# Patient Record
Sex: Female | Born: 1945 | Race: White | Hispanic: No | Marital: Married | Smoking: Never smoker
Health system: Southern US, Community
[De-identification: ages and names within clinical notes are randomized; demographics above are authoritative.]

## PROBLEM LIST (undated history)

## (undated) DIAGNOSIS — E0590 Thyrotoxicosis, unspecified without thyrotoxic crisis or storm: Secondary | ICD-10-CM

## (undated) DIAGNOSIS — K5731 Diverticulosis of large intestine without perforation or abscess with bleeding: Secondary | ICD-10-CM

## (undated) DIAGNOSIS — L409 Psoriasis, unspecified: Secondary | ICD-10-CM

## (undated) DIAGNOSIS — D0739 Carcinoma in situ of other female genital organs: Secondary | ICD-10-CM

## (undated) DIAGNOSIS — Z9221 Personal history of antineoplastic chemotherapy: Secondary | ICD-10-CM

## (undated) DIAGNOSIS — T8859XA Other complications of anesthesia, initial encounter: Secondary | ICD-10-CM

## (undated) DIAGNOSIS — M1990 Unspecified osteoarthritis, unspecified site: Secondary | ICD-10-CM

## (undated) DIAGNOSIS — R351 Nocturia: Secondary | ICD-10-CM

## (undated) DIAGNOSIS — F419 Anxiety disorder, unspecified: Secondary | ICD-10-CM

## (undated) DIAGNOSIS — D696 Thrombocytopenia, unspecified: Secondary | ICD-10-CM

## (undated) DIAGNOSIS — G709 Myoneural disorder, unspecified: Secondary | ICD-10-CM

## (undated) DIAGNOSIS — Z972 Presence of dental prosthetic device (complete) (partial): Secondary | ICD-10-CM

## (undated) DIAGNOSIS — Z923 Personal history of irradiation: Secondary | ICD-10-CM

## (undated) DIAGNOSIS — C801 Malignant (primary) neoplasm, unspecified: Secondary | ICD-10-CM

## (undated) DIAGNOSIS — E039 Hypothyroidism, unspecified: Secondary | ICD-10-CM

## (undated) DIAGNOSIS — C50919 Malignant neoplasm of unspecified site of unspecified female breast: Secondary | ICD-10-CM

## (undated) DIAGNOSIS — E049 Nontoxic goiter, unspecified: Secondary | ICD-10-CM

## (undated) DIAGNOSIS — I10 Essential (primary) hypertension: Secondary | ICD-10-CM

## (undated) DIAGNOSIS — K648 Other hemorrhoids: Secondary | ICD-10-CM

## (undated) DIAGNOSIS — E119 Type 2 diabetes mellitus without complications: Secondary | ICD-10-CM

## (undated) DIAGNOSIS — D649 Anemia, unspecified: Secondary | ICD-10-CM

## (undated) DIAGNOSIS — R011 Cardiac murmur, unspecified: Secondary | ICD-10-CM

## (undated) DIAGNOSIS — J189 Pneumonia, unspecified organism: Secondary | ICD-10-CM

## (undated) DIAGNOSIS — N189 Chronic kidney disease, unspecified: Secondary | ICD-10-CM

## (undated) DIAGNOSIS — E785 Hyperlipidemia, unspecified: Secondary | ICD-10-CM

## (undated) HISTORY — PX: BREAST LUMPECTOMY: SHX2

## (undated) HISTORY — PX: OOPHORECTOMY: SHX86

## (undated) HISTORY — PX: THYROIDECTOMY: SHX17

## (undated) HISTORY — PX: ABDOMINAL HYSTERECTOMY: SHX81

## (undated) HISTORY — PX: CHOLECYSTECTOMY: SHX55

## (undated) HISTORY — PX: FINE NEEDLE ASPIRATION: SHX406

## (undated) HISTORY — PX: APPENDECTOMY: SHX54

---

## 2004-07-22 ENCOUNTER — Ambulatory Visit: Payer: Self-pay | Admitting: Family Medicine

## 2004-08-15 ENCOUNTER — Ambulatory Visit: Payer: Self-pay | Admitting: Gastroenterology

## 2004-10-30 ENCOUNTER — Ambulatory Visit: Payer: Self-pay | Admitting: Gastroenterology

## 2004-11-19 ENCOUNTER — Other Ambulatory Visit: Payer: Self-pay

## 2004-11-19 ENCOUNTER — Ambulatory Visit: Payer: Self-pay | Admitting: Gastroenterology

## 2004-11-25 ENCOUNTER — Ambulatory Visit: Payer: Self-pay | Admitting: Gastroenterology

## 2004-12-15 ENCOUNTER — Emergency Department: Payer: Self-pay | Admitting: Emergency Medicine

## 2005-01-09 ENCOUNTER — Ambulatory Visit: Payer: Self-pay | Admitting: Internal Medicine

## 2005-01-26 ENCOUNTER — Ambulatory Visit: Payer: Self-pay | Admitting: Internal Medicine

## 2005-01-26 DIAGNOSIS — Z9221 Personal history of antineoplastic chemotherapy: Secondary | ICD-10-CM

## 2005-01-26 DIAGNOSIS — Z923 Personal history of irradiation: Secondary | ICD-10-CM

## 2005-01-26 DIAGNOSIS — C801 Malignant (primary) neoplasm, unspecified: Secondary | ICD-10-CM

## 2005-01-26 DIAGNOSIS — C50919 Malignant neoplasm of unspecified site of unspecified female breast: Secondary | ICD-10-CM

## 2005-01-26 HISTORY — PX: MASTECTOMY PARTIAL / LUMPECTOMY: SUR851

## 2005-01-26 HISTORY — DX: Personal history of irradiation: Z92.3

## 2005-01-26 HISTORY — PX: BREAST BIOPSY: SHX20

## 2005-01-26 HISTORY — DX: Malignant (primary) neoplasm, unspecified: C80.1

## 2005-01-26 HISTORY — DX: Personal history of antineoplastic chemotherapy: Z92.21

## 2005-01-26 HISTORY — DX: Malignant neoplasm of unspecified site of unspecified female breast: C50.919

## 2005-02-23 ENCOUNTER — Ambulatory Visit: Payer: Self-pay | Admitting: Unknown Physician Specialty

## 2005-02-25 ENCOUNTER — Ambulatory Visit: Payer: Self-pay | Admitting: Unknown Physician Specialty

## 2005-02-26 ENCOUNTER — Ambulatory Visit: Payer: Self-pay | Admitting: Internal Medicine

## 2005-03-26 ENCOUNTER — Ambulatory Visit: Payer: Self-pay | Admitting: Internal Medicine

## 2005-04-02 ENCOUNTER — Ambulatory Visit: Payer: Self-pay | Admitting: Unknown Physician Specialty

## 2005-05-13 ENCOUNTER — Ambulatory Visit: Payer: Self-pay | Admitting: Internal Medicine

## 2005-05-26 ENCOUNTER — Ambulatory Visit: Payer: Self-pay | Admitting: Internal Medicine

## 2005-07-08 ENCOUNTER — Ambulatory Visit: Payer: Self-pay | Admitting: Internal Medicine

## 2005-07-15 ENCOUNTER — Ambulatory Visit: Payer: Self-pay

## 2005-07-26 ENCOUNTER — Ambulatory Visit: Payer: Self-pay | Admitting: Internal Medicine

## 2005-07-30 ENCOUNTER — Ambulatory Visit: Payer: Self-pay | Admitting: Family Medicine

## 2005-08-04 ENCOUNTER — Ambulatory Visit: Payer: Self-pay | Admitting: Family Medicine

## 2005-08-27 ENCOUNTER — Ambulatory Visit: Payer: Self-pay | Admitting: Unknown Physician Specialty

## 2005-08-31 ENCOUNTER — Ambulatory Visit: Payer: Self-pay | Admitting: Unknown Physician Specialty

## 2005-09-03 ENCOUNTER — Ambulatory Visit: Payer: Self-pay | Admitting: General Surgery

## 2005-09-03 ENCOUNTER — Ambulatory Visit: Payer: Self-pay | Admitting: Internal Medicine

## 2005-09-14 ENCOUNTER — Ambulatory Visit: Payer: Self-pay | Admitting: General Surgery

## 2005-09-26 ENCOUNTER — Ambulatory Visit: Payer: Self-pay | Admitting: Internal Medicine

## 2005-10-01 ENCOUNTER — Ambulatory Visit: Payer: Self-pay | Admitting: General Surgery

## 2005-10-22 ENCOUNTER — Ambulatory Visit: Payer: Self-pay | Admitting: Specialist

## 2005-10-26 ENCOUNTER — Ambulatory Visit: Payer: Self-pay | Admitting: Internal Medicine

## 2005-11-26 ENCOUNTER — Ambulatory Visit: Payer: Self-pay | Admitting: Internal Medicine

## 2005-12-26 ENCOUNTER — Ambulatory Visit: Payer: Self-pay | Admitting: Internal Medicine

## 2006-01-26 ENCOUNTER — Ambulatory Visit: Payer: Self-pay | Admitting: Internal Medicine

## 2006-02-26 ENCOUNTER — Ambulatory Visit: Payer: Self-pay | Admitting: Internal Medicine

## 2006-03-27 ENCOUNTER — Ambulatory Visit: Payer: Self-pay | Admitting: Internal Medicine

## 2006-04-13 ENCOUNTER — Ambulatory Visit: Payer: Self-pay | Admitting: General Surgery

## 2006-04-27 ENCOUNTER — Ambulatory Visit: Payer: Self-pay | Admitting: Internal Medicine

## 2006-05-27 ENCOUNTER — Ambulatory Visit: Payer: Self-pay | Admitting: Internal Medicine

## 2006-06-27 ENCOUNTER — Ambulatory Visit: Payer: Self-pay | Admitting: Internal Medicine

## 2006-06-28 ENCOUNTER — Ambulatory Visit: Payer: Self-pay | Admitting: Internal Medicine

## 2006-06-30 ENCOUNTER — Ambulatory Visit: Payer: Self-pay | Admitting: Unknown Physician Specialty

## 2006-07-27 ENCOUNTER — Ambulatory Visit: Payer: Self-pay | Admitting: Internal Medicine

## 2006-08-27 ENCOUNTER — Ambulatory Visit: Payer: Self-pay | Admitting: Radiation Oncology

## 2006-08-27 ENCOUNTER — Ambulatory Visit: Payer: Self-pay | Admitting: Internal Medicine

## 2006-09-27 ENCOUNTER — Ambulatory Visit: Payer: Self-pay | Admitting: Internal Medicine

## 2006-09-27 ENCOUNTER — Ambulatory Visit: Payer: Self-pay | Admitting: Radiation Oncology

## 2006-10-04 ENCOUNTER — Ambulatory Visit: Payer: Self-pay | Admitting: General Surgery

## 2006-10-27 ENCOUNTER — Ambulatory Visit: Payer: Self-pay | Admitting: Internal Medicine

## 2007-01-27 ENCOUNTER — Ambulatory Visit: Payer: Self-pay | Admitting: Internal Medicine

## 2007-02-21 ENCOUNTER — Ambulatory Visit: Payer: Self-pay | Admitting: Internal Medicine

## 2007-02-27 ENCOUNTER — Ambulatory Visit: Payer: Self-pay | Admitting: Internal Medicine

## 2007-03-27 ENCOUNTER — Ambulatory Visit: Payer: Self-pay | Admitting: Internal Medicine

## 2007-04-15 ENCOUNTER — Ambulatory Visit: Payer: Self-pay | Admitting: Internal Medicine

## 2007-04-27 ENCOUNTER — Ambulatory Visit: Payer: Self-pay | Admitting: Internal Medicine

## 2007-05-09 ENCOUNTER — Ambulatory Visit: Payer: Self-pay | Admitting: General Surgery

## 2007-07-27 ENCOUNTER — Ambulatory Visit: Payer: Self-pay | Admitting: Internal Medicine

## 2007-08-23 ENCOUNTER — Ambulatory Visit: Payer: Self-pay | Admitting: Internal Medicine

## 2007-08-27 ENCOUNTER — Ambulatory Visit: Payer: Self-pay | Admitting: Internal Medicine

## 2007-11-09 ENCOUNTER — Ambulatory Visit: Payer: Self-pay | Admitting: Internal Medicine

## 2008-01-27 ENCOUNTER — Ambulatory Visit: Payer: Self-pay | Admitting: Internal Medicine

## 2008-02-20 ENCOUNTER — Ambulatory Visit: Payer: Self-pay | Admitting: Internal Medicine

## 2008-02-27 ENCOUNTER — Ambulatory Visit: Payer: Self-pay | Admitting: Internal Medicine

## 2008-05-26 ENCOUNTER — Ambulatory Visit: Payer: Self-pay | Admitting: Internal Medicine

## 2008-06-12 ENCOUNTER — Ambulatory Visit: Payer: Self-pay | Admitting: Internal Medicine

## 2008-06-20 ENCOUNTER — Ambulatory Visit: Payer: Self-pay | Admitting: Unknown Physician Specialty

## 2008-06-26 ENCOUNTER — Ambulatory Visit: Payer: Self-pay | Admitting: Internal Medicine

## 2008-07-26 ENCOUNTER — Ambulatory Visit: Payer: Self-pay | Admitting: Internal Medicine

## 2008-08-13 ENCOUNTER — Ambulatory Visit: Payer: Self-pay | Admitting: Internal Medicine

## 2008-08-26 ENCOUNTER — Ambulatory Visit: Payer: Self-pay | Admitting: Internal Medicine

## 2008-11-12 ENCOUNTER — Ambulatory Visit: Payer: Self-pay | Admitting: Internal Medicine

## 2008-11-26 ENCOUNTER — Ambulatory Visit: Payer: Self-pay | Admitting: Internal Medicine

## 2008-12-11 ENCOUNTER — Ambulatory Visit: Payer: Self-pay | Admitting: Internal Medicine

## 2008-12-26 ENCOUNTER — Ambulatory Visit: Payer: Self-pay | Admitting: Internal Medicine

## 2009-04-01 ENCOUNTER — Ambulatory Visit: Payer: Self-pay | Admitting: Unknown Physician Specialty

## 2009-05-26 ENCOUNTER — Ambulatory Visit: Payer: Self-pay | Admitting: Internal Medicine

## 2009-06-06 ENCOUNTER — Ambulatory Visit: Payer: Self-pay | Admitting: Internal Medicine

## 2009-06-26 ENCOUNTER — Ambulatory Visit: Payer: Self-pay | Admitting: Internal Medicine

## 2009-11-13 ENCOUNTER — Ambulatory Visit: Payer: Self-pay | Admitting: Internal Medicine

## 2009-12-13 ENCOUNTER — Ambulatory Visit: Payer: Self-pay | Admitting: Internal Medicine

## 2009-12-26 ENCOUNTER — Ambulatory Visit: Payer: Self-pay | Admitting: Internal Medicine

## 2009-12-31 ENCOUNTER — Ambulatory Visit: Payer: Self-pay | Admitting: Unknown Physician Specialty

## 2010-11-18 ENCOUNTER — Ambulatory Visit: Payer: Self-pay | Admitting: Internal Medicine

## 2010-12-12 ENCOUNTER — Ambulatory Visit: Payer: Self-pay | Admitting: Internal Medicine

## 2010-12-13 LAB — CANCER ANTIGEN 27.29: CA 27.29: 18.3 U/mL (ref 0.0–38.6)

## 2010-12-27 ENCOUNTER — Ambulatory Visit: Payer: Self-pay | Admitting: Internal Medicine

## 2011-10-28 ENCOUNTER — Ambulatory Visit (INDEPENDENT_AMBULATORY_CARE_PROVIDER_SITE_OTHER): Payer: Self-pay | Admitting: Surgery

## 2011-10-28 ENCOUNTER — Encounter (INDEPENDENT_AMBULATORY_CARE_PROVIDER_SITE_OTHER): Payer: Self-pay | Admitting: Surgery

## 2011-11-02 ENCOUNTER — Ambulatory Visit (INDEPENDENT_AMBULATORY_CARE_PROVIDER_SITE_OTHER): Payer: Medicare Other | Admitting: Surgery

## 2011-11-02 ENCOUNTER — Encounter (INDEPENDENT_AMBULATORY_CARE_PROVIDER_SITE_OTHER): Payer: Self-pay | Admitting: Surgery

## 2011-11-02 VITALS — BP 162/48 | HR 76 | Temp 98.0°F | Resp 16 | Ht 62.0 in | Wt 175.6 lb

## 2011-11-02 DIAGNOSIS — E040 Nontoxic diffuse goiter: Secondary | ICD-10-CM | POA: Insufficient documentation

## 2011-11-02 DIAGNOSIS — E0590 Thyrotoxicosis, unspecified without thyrotoxic crisis or storm: Secondary | ICD-10-CM

## 2011-11-02 DIAGNOSIS — E049 Nontoxic goiter, unspecified: Secondary | ICD-10-CM

## 2011-11-02 NOTE — Patient Instructions (Signed)
[facility] [phone]  THYROID & PARATHYROID SURGERY -- POST OP INSTRUCTIONS  Always review your discharge instruction sheet from the facility where your surgery was performed.  1. A prescription for pain medication may be given to you upon discharge.  Take your pain medication as prescribed.  If narcotic pain medicine is not needed, then you may take acetaminophen (Tylenol) or ibuprofen (Advil) as needed. 2. Take your usually prescribed medications unless otherwise directed. 3. If you need a refill on your pain medication, please contact your pharmacy. They will contact our office to request authorization.  Prescriptions will not be processed after 5 pm or on weekends. 4. Start with a light diet upon arrival home, such as soup and crackers or toast.  Be sure to drink plenty of fluids daily.  Resume your normal diet the day after surgery. 5. Most patients will experience some swelling and bruising on the chest and neck area.  Ice packs will help.  Swelling and bruising can take several days to resolve.  6. It is common to experience some constipation if taking pain medication after surgery.  Increasing fluid intake and taking a stool softener will usually help or prevent this problem.  A mild laxative (Milk of Magnesia or Miralax) should be taken according to package directions if there are no bowel movements after 48 hours. 7. You may remove your bandages 24-48 hours after surgery, and you may shower at that time.  You have steri-strips (small skin tapes) in place directly over the incision.  These strips should be left on the skin for 7-10 days and then removed. 8. You may resume regular (light) daily activities beginning the next day-such as daily self-care, walking, climbing stairs-gradually increasing activities as tolerated.  You may have sexual intercourse when it is comfortable.  Refrain from any heavy lifting or straining until approved by your doctor.  You may drive when  you no longer are taking prescription pain medication, you can comfortably wear a seatbelt, and you can safely maneuver your car and apply brakes. 9. You should see your doctor in the office for a follow-up appointment approximately two to three weeks after your surgery.  Make sure that you call for this appointment within a day or two after you arrive home to insure a convenient appointment time.  WHEN TO CALL YOUR DOCTOR: 1. Fever greater than 101.5 2. Inability to urinate 3. Nausea and/or vomiting - persistent 4. Extreme swelling or bruising 5. Continued bleeding from incision 6. Increased pain, redness, or drainage from the incision 7. Difficulty swallowing or breathing 8. Muscle cramping or spasms 9. Numbness or tingling in hands or around lips  The clinic staff is available to answer your questions during regular business hours.  Please don't hesitate to call and ask to speak to one of the nurses if you have concerns. [facility] [phone]  [email or web address]   

## 2011-11-02 NOTE — Progress Notes (Signed)
General Surgery - [facility]  Chief Complaint  Patient presents with  . New Evaluation    eval goiter - referral from Dr. Aileen Miller, [facility]    HISTORY: Patient is a 66-year-old white female referred by her endocrinologist for evaluation of enlarging thyroid goiter and borderline hyperthyroidism. Patient has had a history of thyroid goiter dating back 35 years. She has had gradual increase in size. She has been treated in the distant past with oral iodine therapy. She underwent radioactive iodine treatment with 142 mCi of I-125 E. In March of 2011. However the patient continues to have suppressed TSH levels with her most recent level being 0.142. Thyroid ultrasound shows a markedly enlarged gland with the right lobe measuring 7.8 cm in the left lobe measuring 8.3 cm in greatest dimension. The gland is heterogeneous but without discrete nodules. There are scattered calcifications. Patient is not currently on any thyroid medication. By report she has undergone previous fine-needle aspiration biopsies which were negative for malignancy.  Patient has a family history of thyroid goiter in her mother. There is no history of thyroid malignancy. There is no family history of other endocrinopathy.  History reviewed. No pertinent past medical history.   Current Outpatient Prescriptions  Medication Sig Dispense Refill  . aspirin 81 MG tablet Take 81 mg by mouth daily.      . BAYER CONTOUR NEXT TEST test strip       . BAYER MICROLET LANCETS lancets       . benazepril (LOTENSIN) 20 MG tablet Take 20 mg by mouth daily.      . glipiZIDE (GLUCOTROL) 10 MG tablet       . hydrochlorothiazide (HYDRODIURIL) 25 MG tablet       . lovastatin (MEVACOR) 40 MG tablet       . metFORMIN (GLUCOPHAGE) 1000 MG tablet       . metroNIDAZOLE (FLAGYL) 500 MG tablet       . OVER THE COUNTER MEDICATION Vitamin B12      . sitaGLIPtin (JANUVIA) 100 MG tablet Take 100 mg by mouth daily.          No Known Allergies   Family History  Problem Relation Age of Onset  . Cancer Mother   . Heart disease Father   . Cancer Sister   . Cancer Brother      History   Social History  . Marital Status: Married    Spouse Name: N/A    Number of Children: N/A  . Years of Education: N/A   Social History Main Topics  . Smoking status: Never Smoker   . Smokeless tobacco: Never Used  . Alcohol Use: No  . Drug Use: No  . Sexually Active: None   Other Topics Concern  . None   Social History Narrative  . None     REVIEW OF SYSTEMS - PERTINENT POSITIVES ONLY: The patient does note tremor on occasion. She also has occasional palpitations. She notes occasional shortness of breath which she relates to her enlarged thyroid. She also complains of a pressure sensation when lying supine.  EXAM: Filed Vitals:   11/02/11 0911  BP: 162/48  Pulse: 76  Temp: 98 F (36.7 C)  Resp: 16    HEENT: normocephalic; pupils equal and reactive; sclerae clear; dentition good; mucous membranes moist NECK:  Visually enlarged thyroid, greater on the right than on the left, trachea is midline; palpation shows a soft enlarged thyroid gland bilaterally without discrete or dominant   nodule; asymmetric on extension; no palpable anterior or posterior cervical lymphadenopathy; no supraclavicular masses; no tenderness CHEST: clear to auscultation bilaterally without rales, rhonchi, or wheezes CARDIAC: regular rate and rhythm without significant murmur; peripheral pulses are full EXT:  non-tender without edema; no deformity NEURO: no gross focal deficits; no sign of tremor   LABORATORY RESULTS: See [facility] (CHL-Epic) for most recent results   RADIOLOGY RESULTS: See [facility] (CHL-Epic) for most recent results   IMPRESSION: #1 enlarging thyroid goiter with mild to moderate compressive symptoms #2 subclinical hyperthyroidism, status post radioactive iodine ablation  PLAN: I  discussed the indications for total thyroidectomy with the patient and her daughter. We discussed the risk and benefits of the procedure. I explained that there is not an absolute indication for thyroidectomy and that she could continue to be managed medically. However, given the fact that she has continued enlargement, calcifications, mild hyperthyroidism, and mild to moderate compressive symptoms, I feel she would benefit from total thyroidectomy.  We discussed the risk and benefits of the procedure including recurrent nerve injury and injury to parathyroid glands. We discussed the surgical incision to be employed and the cosmetic results to be anticipated. We discussed the hospital stay and her postoperative recovery. We discussed the need for lifelong thyroid hormone replacement. She understands and wishes to proceed.  The risks and benefits of the procedure have been discussed at length with the patient.  The patient understands the proposed procedure, potential alternative treatments, and the course of recovery to be expected.  All of the patient's questions have been answered at this time.  The patient wishes to proceed with surgery.  Sarrinah Gardin M. Palmira Stickle, MD, FACS General & Endocrine Surgery [facility]   Visit Diagnoses: 1. Goiter diffuse, nontoxic   2. Hyperthyroidism, subclinical     Primary Care Physician: Pcp Not In System   

## 2011-11-04 ENCOUNTER — Encounter (INDEPENDENT_AMBULATORY_CARE_PROVIDER_SITE_OTHER): Payer: Self-pay

## 2011-11-10 ENCOUNTER — Encounter (HOSPITAL_COMMUNITY): Payer: Self-pay | Admitting: Pharmacy Technician

## 2011-11-16 ENCOUNTER — Encounter (HOSPITAL_COMMUNITY)
Admission: RE | Admit: 2011-11-16 | Discharge: 2011-11-16 | Disposition: A | Discharge status: Home / Self Care | Payer: Medicare Other | Source: Ambulatory Visit | Attending: Surgery | Admitting: Surgery

## 2011-11-16 ENCOUNTER — Encounter (HOSPITAL_COMMUNITY): Payer: Self-pay

## 2011-11-16 ENCOUNTER — Ambulatory Visit (HOSPITAL_COMMUNITY)
Admission: RE | Admit: 2011-11-16 | Discharge: 2011-11-16 | Disposition: A | Discharge status: Home / Self Care | Payer: Medicare Other | Source: Ambulatory Visit | Attending: Surgery | Admitting: Surgery

## 2011-11-16 DIAGNOSIS — Z01818 Encounter for other preprocedural examination: Secondary | ICD-10-CM | POA: Insufficient documentation

## 2011-11-16 HISTORY — DX: Type 2 diabetes mellitus without complications: E11.9

## 2011-11-16 HISTORY — DX: Malignant (primary) neoplasm, unspecified: C80.1

## 2011-11-16 HISTORY — DX: Hyperlipidemia, unspecified: E78.5

## 2011-11-16 HISTORY — DX: Myoneural disorder, unspecified: G70.9

## 2011-11-16 HISTORY — DX: Unspecified osteoarthritis, unspecified site: M19.90

## 2011-11-16 HISTORY — DX: Nocturia: R35.1

## 2011-11-16 HISTORY — DX: Essential (primary) hypertension: I10

## 2011-11-16 HISTORY — DX: Cardiac murmur, unspecified: R01.1

## 2011-11-16 HISTORY — DX: Anemia, unspecified: D64.9

## 2011-11-16 HISTORY — DX: Thyrotoxicosis, unspecified without thyrotoxic crisis or storm: E05.90

## 2011-11-16 LAB — BASIC METABOLIC PANEL
CO2: 27 mEq/L (ref 19–32)
Calcium: 9.8 mg/dL (ref 8.4–10.5)
Creatinine, Ser: 1.08 mg/dL (ref 0.50–1.10)

## 2011-11-16 LAB — CBC
MCH: 30.6 pg (ref 26.0–34.0)
MCHC: 33.3 g/dL (ref 30.0–36.0)
MCV: 91.8 fL (ref 78.0–100.0)
Platelets: 184 10*3/uL (ref 150–400)
RBC: 3.76 MIL/uL — ABNORMAL LOW (ref 3.87–5.11)

## 2011-11-16 LAB — SURGICAL PCR SCREEN: MRSA, PCR: NEGATIVE

## 2011-11-16 NOTE — Progress Notes (Signed)
Quick Note:  These results are acceptable for scheduled surgery.  Madeline Fox M. Madeline Dreibelbis, MD, FACS [facility] Office: [phone]   ______ 

## 2011-11-16 NOTE — Patient Instructions (Addendum)
Madeline Fox  11/16/2011                           YOUR PROCEDURE IS SCHEDULED ON: 11/19/11 AT 9:30 AM               PLEASE REPORT TO SHORT STAY CENTER AT : 7:00 AM               CALL THIS NUMBER IF ANY PROBLEMS THE DAY OF SURGERY :               [phone]                      REMEMBER:   Do not eat food or drink liquids AFTER MIDNIGHT   Take these medicines the morning of surgery with A SIP OF WATER: NONE   Do not wear jewelry, make-up   Do not wear lotions, powders, or perfumes.   Do not shave legs or underarms 12 hrs. before surgery (men may shave face)  Do not bring valuables to the hospital.  Contacts, dentures or bridgework may not be worn into surgery.  Leave suitcase in the car. After surgery it may be brought to your room.  For patients admitted to the hospital more than one night, checkout time is 11:00                          The day of discharge.   Patients discharged the day of surgery will not be allowed to drive home                             If going home same day of surgery, must have someone stay with you first                           24 hrs at home and arrange for some one to drive you home from hospital.    Special Instructions:   Please read over the following fact sheets that you were given:               1. MRSA  INFORMATION                      2. [place] PREPARING FOR SURGERY SHEET               3. STOP ASPIRIN 5 DAYS PREOP                                              X_____________________________________________________________________

## 2011-11-18 ENCOUNTER — Telehealth (INDEPENDENT_AMBULATORY_CARE_PROVIDER_SITE_OTHER): Payer: Self-pay

## 2011-11-18 ENCOUNTER — Other Ambulatory Visit (INDEPENDENT_AMBULATORY_CARE_PROVIDER_SITE_OTHER): Payer: Self-pay

## 2011-11-18 NOTE — Telephone Encounter (Signed)
Lab slip mailed to pt for calcium to be drawn 12-03-11.

## 2011-11-19 ENCOUNTER — Observation Stay (HOSPITAL_COMMUNITY)
Admission: RE | Admit: 2011-11-19 | Discharge: 2011-11-20 | Disposition: A | Discharge status: Home / Self Care | Payer: Medicare Other | Source: Ambulatory Visit | Attending: Surgery | Admitting: Surgery

## 2011-11-19 ENCOUNTER — Encounter (HOSPITAL_COMMUNITY): Payer: Self-pay

## 2011-11-19 ENCOUNTER — Ambulatory Visit (HOSPITAL_COMMUNITY): Payer: Medicare Other | Admitting: Anesthesiology

## 2011-11-19 ENCOUNTER — Encounter (HOSPITAL_COMMUNITY): Payer: Self-pay | Admitting: Anesthesiology

## 2011-11-19 ENCOUNTER — Encounter (HOSPITAL_COMMUNITY)
Admission: RE | Disposition: A | Discharge status: Home / Self Care | Payer: Self-pay | Source: Ambulatory Visit | Attending: Surgery

## 2011-11-19 DIAGNOSIS — Z79899 Other long term (current) drug therapy: Secondary | ICD-10-CM | POA: Insufficient documentation

## 2011-11-19 DIAGNOSIS — I10 Essential (primary) hypertension: Secondary | ICD-10-CM | POA: Insufficient documentation

## 2011-11-19 DIAGNOSIS — E040 Nontoxic diffuse goiter: Secondary | ICD-10-CM

## 2011-11-19 DIAGNOSIS — E049 Nontoxic goiter, unspecified: Secondary | ICD-10-CM

## 2011-11-19 DIAGNOSIS — E119 Type 2 diabetes mellitus without complications: Secondary | ICD-10-CM | POA: Insufficient documentation

## 2011-11-19 DIAGNOSIS — E0500 Thyrotoxicosis with diffuse goiter without thyrotoxic crisis or storm: Principal | ICD-10-CM | POA: Insufficient documentation

## 2011-11-19 DIAGNOSIS — Z7982 Long term (current) use of aspirin: Secondary | ICD-10-CM | POA: Insufficient documentation

## 2011-11-19 DIAGNOSIS — E0590 Thyrotoxicosis, unspecified without thyrotoxic crisis or storm: Secondary | ICD-10-CM | POA: Diagnosis present

## 2011-11-19 HISTORY — PX: THYROIDECTOMY: SHX17

## 2011-11-19 LAB — GLUCOSE, CAPILLARY: Glucose-Capillary: 245 mg/dL — ABNORMAL HIGH (ref 70–99)

## 2011-11-19 SURGERY — THYROIDECTOMY
Anesthesia: General | Site: Chest | Wound class: Clean

## 2011-11-19 MED ORDER — ONDANSETRON HCL 4 MG/2ML IJ SOLN: 4.0000 mg | Freq: Four times a day (QID) | INTRAMUSCULAR | Status: DC | PRN

## 2011-11-19 MED ORDER — DEXAMETHASONE SODIUM PHOSPHATE 10 MG/ML IJ SOLN
INTRAMUSCULAR | Status: DC | PRN
  Administered 2011-11-19: 10 mg via INTRAVENOUS

## 2011-11-19 MED ORDER — BENAZEPRIL HCL 10 MG PO TABS
10.0000 mg | ORAL_TABLET | Freq: Every morning | ORAL | Status: DC
  Administered 2011-11-19 – 2011-11-20 (×2): 10 mg via ORAL
  Filled 2011-11-19 (×2): qty 1

## 2011-11-19 MED ORDER — GLYCOPYRROLATE 0.2 MG/ML IJ SOLN
INTRAMUSCULAR | Status: DC | PRN
  Administered 2011-11-19: 0.6 mg via INTRAVENOUS

## 2011-11-19 MED ORDER — ONDANSETRON HCL 4 MG/2ML IJ SOLN
INTRAMUSCULAR | Status: DC | PRN
  Administered 2011-11-19: 4 mg via INTRAVENOUS

## 2011-11-19 MED ORDER — PNEUMOCOCCAL VAC POLYVALENT 25 MCG/0.5ML IJ INJ: 0.5000 mL | INJECTION | INTRAMUSCULAR | Status: DC

## 2011-11-19 MED ORDER — CISATRACURIUM BESYLATE (PF) 10 MG/5ML IV SOLN
INTRAVENOUS | Status: DC | PRN
  Administered 2011-11-19: 4 mg via INTRAVENOUS
  Administered 2011-11-19: 8 mg via INTRAVENOUS
  Administered 2011-11-19: 3 mg via INTRAVENOUS

## 2011-11-19 MED ORDER — HYDROCODONE-ACETAMINOPHEN 5-325 MG PO TABS: 1.0000 | ORAL_TABLET | ORAL | Status: DC | PRN

## 2011-11-19 MED ORDER — METFORMIN HCL 500 MG PO TABS
1000.0000 mg | ORAL_TABLET | Freq: Two times a day (BID) | ORAL | Status: DC
  Administered 2011-11-19 – 2011-11-20 (×2): 1000 mg via ORAL
  Filled 2011-11-19 (×4): qty 2

## 2011-11-19 MED ORDER — GLIPIZIDE 10 MG PO TABS
10.0000 mg | ORAL_TABLET | Freq: Two times a day (BID) | ORAL | Status: DC
  Administered 2011-11-19 – 2011-11-20 (×2): 10 mg via ORAL
  Filled 2011-11-19 (×4): qty 1

## 2011-11-19 MED ORDER — KCL IN DEXTROSE-NACL 20-5-0.45 MEQ/L-%-% IV SOLN
INTRAVENOUS | Status: DC
  Administered 2011-11-19: 75 mL/h via INTRAVENOUS
  Filled 2011-11-19 (×2): qty 1000

## 2011-11-19 MED ORDER — ACETAMINOPHEN 10 MG/ML IV SOLN
INTRAVENOUS | Status: AC
  Filled 2011-11-19: qty 100

## 2011-11-19 MED ORDER — CALCIUM CARBONATE 1250 (500 CA) MG PO TABS
1250.0000 mg | ORAL_TABLET | Freq: Three times a day (TID) | ORAL | Status: DC
  Filled 2011-11-19 (×2): qty 1

## 2011-11-19 MED ORDER — FENTANYL CITRATE 0.05 MG/ML IJ SOLN
INTRAMUSCULAR | Status: AC
  Filled 2011-11-19: qty 2

## 2011-11-19 MED ORDER — PROMETHAZINE HCL 25 MG/ML IJ SOLN: 6.2500 mg | INTRAMUSCULAR | Status: DC | PRN

## 2011-11-19 MED ORDER — MEPERIDINE HCL 50 MG/ML IJ SOLN: 6.2500 mg | INTRAMUSCULAR | Status: DC | PRN

## 2011-11-19 MED ORDER — ACETAMINOPHEN 500 MG PO TABS: 500.0000 mg | ORAL_TABLET | Freq: Four times a day (QID) | ORAL | Status: DC | PRN

## 2011-11-19 MED ORDER — LACTATED RINGERS IV SOLN: INTRAVENOUS | Status: DC

## 2011-11-19 MED ORDER — FENTANYL CITRATE 0.05 MG/ML IJ SOLN
25.0000 ug | INTRAMUSCULAR | Status: DC | PRN
  Administered 2011-11-19: 50 ug via INTRAVENOUS

## 2011-11-19 MED ORDER — PROPOFOL 10 MG/ML IV BOLUS
INTRAVENOUS | Status: DC | PRN
  Administered 2011-11-19: 150 mg via INTRAVENOUS

## 2011-11-19 MED ORDER — MIDAZOLAM HCL 5 MG/5ML IJ SOLN
INTRAMUSCULAR | Status: DC | PRN
  Administered 2011-11-19: 2 mg via INTRAVENOUS

## 2011-11-19 MED ORDER — CALCIUM CARBONATE 1250 (500 CA) MG PO TABS
500.0000 mg | ORAL_TABLET | Freq: Three times a day (TID) | ORAL | Status: DC
  Administered 2011-11-19 – 2011-11-20 (×3): 500 mg via ORAL
  Filled 2011-11-19 (×5): qty 1

## 2011-11-19 MED ORDER — FENTANYL CITRATE 0.05 MG/ML IJ SOLN
INTRAMUSCULAR | Status: DC | PRN
  Administered 2011-11-19 (×2): 100 ug via INTRAVENOUS

## 2011-11-19 MED ORDER — ACETAMINOPHEN 325 MG PO TABS
650.0000 mg | ORAL_TABLET | ORAL | Status: DC | PRN
  Administered 2011-11-20: 650 mg via ORAL
  Filled 2011-11-19 (×2): qty 1

## 2011-11-19 MED ORDER — CEFAZOLIN SODIUM-DEXTROSE 2-3 GM-% IV SOLR
2.0000 g | INTRAVENOUS | Status: AC
  Administered 2011-11-19: 2 g via INTRAVENOUS

## 2011-11-19 MED ORDER — NEOSTIGMINE METHYLSULFATE 1 MG/ML IJ SOLN
INTRAMUSCULAR | Status: DC | PRN
  Administered 2011-11-19: 4 mg via INTRAVENOUS

## 2011-11-19 MED ORDER — LACTATED RINGERS IV SOLN
INTRAVENOUS | Status: DC
  Administered 2011-11-19: 11:00:00 via INTRAVENOUS
  Administered 2011-11-19: 1000 mL via INTRAVENOUS

## 2011-11-19 MED ORDER — ACETAMINOPHEN 10 MG/ML IV SOLN
INTRAVENOUS | Status: DC | PRN
  Administered 2011-11-19: 1000 mg via INTRAVENOUS

## 2011-11-19 MED ORDER — BIOTENE DRY MOUTH MT LIQD
15.0000 mL | Freq: Two times a day (BID) | OROMUCOSAL | Status: DC
  Administered 2011-11-19: 15 mL via OROMUCOSAL

## 2011-11-19 MED ORDER — 0.9 % SODIUM CHLORIDE (POUR BTL) OPTIME
TOPICAL | Status: DC | PRN
  Administered 2011-11-19: 1000 mL

## 2011-11-19 MED ORDER — LINAGLIPTIN 5 MG PO TABS
5.0000 mg | ORAL_TABLET | Freq: Every day | ORAL | Status: DC
  Administered 2011-11-19 – 2011-11-20 (×2): 5 mg via ORAL
  Filled 2011-11-19 (×2): qty 1

## 2011-11-19 MED ORDER — HYDROCHLOROTHIAZIDE 25 MG PO TABS
25.0000 mg | ORAL_TABLET | Freq: Every morning | ORAL | Status: DC
  Administered 2011-11-19 – 2011-11-20 (×2): 25 mg via ORAL
  Filled 2011-11-19 (×2): qty 1

## 2011-11-19 MED ORDER — ONDANSETRON HCL 4 MG PO TABS: 4.0000 mg | ORAL_TABLET | Freq: Four times a day (QID) | ORAL | Status: DC | PRN

## 2011-11-19 MED ORDER — HYDROMORPHONE HCL PF 1 MG/ML IJ SOLN
1.0000 mg | INTRAMUSCULAR | Status: DC | PRN
  Administered 2011-11-19 – 2011-11-20 (×2): 1 mg via INTRAVENOUS
  Filled 2011-11-19 (×2): qty 1

## 2011-11-19 MED ORDER — CEFAZOLIN SODIUM-DEXTROSE 2-3 GM-% IV SOLR
INTRAVENOUS | Status: AC
  Filled 2011-11-19: qty 50

## 2011-11-19 SURGICAL SUPPLY — 37 items
ATTRACTOMAT 16X20 MAGNETIC DRP (DRAPES) ×2 IMPLANT
BENZOIN TINCTURE PRP APPL 2/3 (GAUZE/BANDAGES/DRESSINGS) ×2 IMPLANT
BLADE HEX COATED 2.75 (ELECTRODE) ×2 IMPLANT
BLADE SURG 15 STRL LF DISP TIS (BLADE) ×1 IMPLANT
BLADE SURG 15 STRL SS (BLADE) ×1
CANISTER SUCTION 2500CC (MISCELLANEOUS) ×2 IMPLANT
CHLORAPREP W/TINT 10.5 ML (MISCELLANEOUS) ×2 IMPLANT
CLIP TI MEDIUM 6 (CLIP) ×10 IMPLANT
CLIP TI WIDE RED SMALL 6 (CLIP) ×6 IMPLANT
CLOTH BEACON ORANGE TIMEOUT ST (SAFETY) ×2 IMPLANT
DISSECTOR ROUND CHERRY 3/8 STR (MISCELLANEOUS) IMPLANT
DRAPE PED LAPAROTOMY (DRAPES) ×2 IMPLANT
DRESSING SURGICEL FIBRLLR 1X2 (HEMOSTASIS) ×1 IMPLANT
DRSG SURGICEL FIBRILLAR 1X2 (HEMOSTASIS) ×2
ELECT REM PT RETURN 9FT ADLT (ELECTROSURGICAL) ×2
ELECTRODE REM PT RTRN 9FT ADLT (ELECTROSURGICAL) ×1 IMPLANT
GAUZE SPONGE 4X4 16PLY XRAY LF (GAUZE/BANDAGES/DRESSINGS) ×4 IMPLANT
GLOVE SURG ORTHO 8.0 STRL STRW (GLOVE) ×2 IMPLANT
GOWN STRL NON-REIN LRG LVL3 (GOWN DISPOSABLE) ×2 IMPLANT
GOWN STRL REIN XL XLG (GOWN DISPOSABLE) ×4 IMPLANT
KIT BASIN OR (CUSTOM PROCEDURE TRAY) ×2 IMPLANT
NS IRRIG 1000ML POUR BTL (IV SOLUTION) ×2 IMPLANT
PACK BASIC VI WITH GOWN DISP (CUSTOM PROCEDURE TRAY) ×2 IMPLANT
PENCIL BUTTON HOLSTER BLD 10FT (ELECTRODE) ×2 IMPLANT
SHEARS HARMONIC 9CM CVD (BLADE) ×2 IMPLANT
SPONGE GAUZE 4X4 12PLY (GAUZE/BANDAGES/DRESSINGS) ×2 IMPLANT
STAPLER VISISTAT 35W (STAPLE) ×2 IMPLANT
STRIP CLOSURE SKIN 1/2X4 (GAUZE/BANDAGES/DRESSINGS) ×2 IMPLANT
SUT MNCRL AB 4-0 PS2 18 (SUTURE) ×2 IMPLANT
SUT SILK 2 0 (SUTURE) ×1
SUT SILK 2-0 18XBRD TIE 12 (SUTURE) ×1 IMPLANT
SUT SILK 3 0 (SUTURE)
SUT SILK 3-0 18XBRD TIE 12 (SUTURE) IMPLANT
SUT VIC AB 3-0 SH 18 (SUTURE) ×4 IMPLANT
SYR BULB IRRIGATION 50ML (SYRINGE) ×2 IMPLANT
TOWEL OR 17X26 10 PK STRL BLUE (TOWEL DISPOSABLE) ×2 IMPLANT
YANKAUER SUCT BULB TIP 10FT TU (MISCELLANEOUS) ×2 IMPLANT

## 2011-11-19 NOTE — H&P (View-Only) (Signed)
General Surgery [facility]  Chief Complaint  Patient presents with  . New Evaluation    eval goiter - referral from Dr. Silver Huguenin, [facility]    HISTORY: Patient is a 66 year old white female referred by her endocrinologist for evaluation of enlarging thyroid goiter and borderline hyperthyroidism. Patient has had a history of thyroid goiter dating back 35 years. She has had gradual increase in size. She has been treated in the distant past with oral iodine therapy. She underwent radioactive iodine treatment with 142 mCi of I-125 E. In March of 2011. However the patient continues to have suppressed TSH levels with her most recent level being 0.142. Thyroid ultrasound shows a markedly enlarged gland with the right lobe measuring 7.8 cm in the left lobe measuring 8.3 cm in greatest dimension. The gland is heterogeneous but without discrete nodules. There are scattered calcifications. Patient is not currently on any thyroid medication. By report she has undergone previous fine-needle aspiration biopsies which were negative for malignancy.  Patient has a family history of thyroid goiter in her mother. There is no history of thyroid malignancy. There is no family history of other endocrinopathy.  History reviewed. No pertinent past medical history.   Current Outpatient Prescriptions  Medication Sig Dispense Refill  . aspirin 81 MG tablet Take 81 mg by mouth daily.      Marland Kitchen BAYER CONTOUR NEXT TEST test strip       . BAYER MICROLET LANCETS lancets       . benazepril (LOTENSIN) 20 MG tablet Take 20 mg by mouth daily.      Marland Kitchen glipiZIDE (GLUCOTROL) 10 MG tablet       . hydrochlorothiazide (HYDRODIURIL) 25 MG tablet       . lovastatin (MEVACOR) 40 MG tablet       . metFORMIN (GLUCOPHAGE) 1000 MG tablet       . metroNIDAZOLE (FLAGYL) 500 MG tablet       . OVER THE COUNTER MEDICATION Vitamin B12      . sitaGLIPtin (JANUVIA) 100 MG tablet Take 100 mg by mouth daily.          No Known Allergies   Family History  Problem Relation Age of Onset  . Cancer Mother   . Heart disease Father   . Cancer Sister   . Cancer Brother      History   Social History  . Marital Status: Married    Spouse Name: N/A    Number of Children: N/A  . Years of Education: N/A   Social History Main Topics  . Smoking status: Never Smoker   . Smokeless tobacco: Never Used  . Alcohol Use: No  . Drug Use: No  . Sexually Active: None   Other Topics Concern  . None   Social History Narrative  . None     REVIEW OF SYSTEMS - PERTINENT POSITIVES ONLY: The patient does note tremor on occasion. She also has occasional palpitations. She notes occasional shortness of breath which she relates to her enlarged thyroid. She also complains of a pressure sensation when lying supine.  EXAM: Filed Vitals:   11/02/11 0911  BP: 162/48  Pulse: 76  Temp: 98 F (36.7 C)  Resp: 16    HEENT: normocephalic; pupils equal and reactive; sclerae clear; dentition good; mucous membranes moist NECK:  Visually enlarged thyroid, greater on the right than on the left, trachea is midline; palpation shows a soft enlarged thyroid gland bilaterally without discrete or dominant  nodule; asymmetric on extension; no palpable anterior or posterior cervical lymphadenopathy; no supraclavicular masses; no tenderness CHEST: clear to auscultation bilaterally without rales, rhonchi, or wheezes CARDIAC: regular rate and rhythm without significant murmur; peripheral pulses are full EXT:  non-tender without edema; no deformity NEURO: no gross focal deficits; no sign of tremor   LABORATORY RESULTS: See [facility] (CHL-Epic) for most recent results   RADIOLOGY RESULTS: See [facility] (CHL-Epic) for most recent results   IMPRESSION: #1 enlarging thyroid goiter with mild to moderate compressive symptoms #2 subclinical hyperthyroidism, status post radioactive iodine ablation  PLAN: I  discussed the indications for total thyroidectomy with the patient and her daughter. We discussed the risk and benefits of the procedure. I explained that there is not an absolute indication for thyroidectomy and that she could continue to be managed medically. However, given the fact that she has continued enlargement, calcifications, mild hyperthyroidism, and mild to moderate compressive symptoms, I feel she would benefit from total thyroidectomy.  We discussed the risk and benefits of the procedure including recurrent nerve injury and injury to parathyroid glands. We discussed the surgical incision to be employed and the cosmetic results to be anticipated. We discussed the hospital stay and her postoperative recovery. We discussed the need for lifelong thyroid hormone replacement. She understands and wishes to proceed.  The risks and benefits of the procedure have been discussed at length with the patient.  The patient understands the proposed procedure, potential alternative treatments, and the course of recovery to be expected.  All of the patient's questions have been answered at this time.  The patient wishes to proceed with surgery.  Velora Heckler, MD, FACS General & Endocrine Surgery [facility]   Visit Diagnoses: 1. Goiter diffuse, nontoxic   2. Hyperthyroidism, subclinical     Primary Care Physician: Pcp Not In System

## 2011-11-19 NOTE — Interval H&P Note (Signed)
History and Physical Interval Note:  11/19/2011 9:17 AM  Madeline Fox  has presented today for surgery, with the diagnosis of THYROID GOITER, HYPERTHYROIDISM.  The various methods of treatment have been discussed with the patient and family. After consideration of risks, benefits and other options for treatment, the patient has consented to    Procedure(s) (LRB) with comments: THYROIDECTOMY (N/A) - Total Thyroidectomy as a surgical intervention .    The patient's history has been reviewed, patient examined, no change in status, stable for surgery.  I have reviewed the patient's chart and labs.  Questions were answered to the patient's satisfaction.    Velora Heckler, MD, FACS General & Endocrine Surgery [facility] Office: [phone]   [name] Judie Petit

## 2011-11-19 NOTE — Transfer of Care (Signed)
Immediate Anesthesia Transfer of Care Note  Patient: Madeline Fox  Procedure(s) Performed: Procedure(s) (LRB) with comments: THYROIDECTOMY (N/A) - Total Thyroidectomy  Patient Location: PACU  Anesthesia Type: General  Level of Consciousness: awake, sedated and patient cooperative  Airway & Oxygen Therapy: Patient Spontanous Breathing and Patient connected to face mask oxygen  Post-op Assessment: Report given to PACU RN and Post -op Vital signs reviewed and stable  Post vital signs: Reviewed and stable  Complications: No apparent anesthesia complications

## 2011-11-19 NOTE — Brief Op Note (Signed)
11/19/2011  11:57 AM  PATIENT:  Madeline Fox  66 y.o. female  PRE-OPERATIVE DIAGNOSIS:  Thyroid goiter,hyperthyroidism  POST-OPERATIVE DIAGNOSIS:  same  PROCEDURE:  Procedure(s) (LRB) with comments: THYROIDECTOMY (N/A) - Total Thyroidectomy  SURGEON:  Surgeon(s) and Role:    * Velora Heckler, MD - Primary  ANESTHESIA:   general  EBL:  Total I/O In: 1600 [I.V.:1600] Out: 225 [Blood:225]  BLOOD ADMINISTERED:none  DRAINS: none   LOCAL MEDICATIONS USED:  NONE  SPECIMEN:  Excision  DISPOSITION OF SPECIMEN:  PATHOLOGY  COUNTS:  YES  TOURNIQUET:  * No tourniquets in log *  DICTATION: .Other Dictation: Dictation Number [id number]  PLAN OF CARE: Admit for overnight observation  PATIENT DISPOSITION:  PACU - hemodynamically stable.   Delay start of Pharmacological VTE agent (>24hrs) due to surgical blood loss or risk of bleeding: yes  Velora Heckler, MD, FACS General & Endocrine Surgery [facility] Office: [phone]

## 2011-11-19 NOTE — Anesthesia Preprocedure Evaluation (Signed)
Anesthesia Evaluation  Patient identified by MRN, date of birth, ID band Patient awake    Reviewed: Allergy & Precautions, H&P , NPO status , Patient's Chart, lab work & pertinent test results  Airway Mallampati: II TM Distance: >3 FB Neck ROM: Full    Dental No notable dental hx. (+) Edentulous Upper and Edentulous Lower   Pulmonary neg pulmonary ROS,  breath sounds clear to auscultation  Pulmonary exam normal       Cardiovascular hypertension, Pt. 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) negative neurological ROS  negative psych ROS   GI/Hepatic negative GI ROS, Neg liver ROS,   Endo/Other  negative endocrine ROSdiabetes, Oral Hypoglycemic AgentsHyperthyroidism   Renal/GU negative Renal ROS  negative genitourinary   Musculoskeletal negative musculoskeletal ROS (+)   Abdominal   Peds negative pediatric ROS (+)  Hematology negative hematology ROS (+)   Anesthesia Other Findings   Reproductive/Obstetrics negative OB ROS                           Anesthesia Physical Anesthesia Plan  ASA: II  Anesthesia Plan: General   Post-op Pain Management:    Induction: Intravenous  Airway Management Planned: Oral ETT  Additional Equipment:   Intra-op Plan:   Post-operative Plan: Extubation in OR  Informed Consent: I have reviewed the patients History and Physical, chart, labs and discussed the procedure including the risks, benefits and alternatives for the proposed anesthesia with the patient or authorized representative who has indicated his/her understanding and acceptance.   Dental advisory given  Plan Discussed with: CRNA  Anesthesia Plan Comments:         Anesthesia Quick Evaluation

## 2011-11-19 NOTE — Anesthesia Postprocedure Evaluation (Signed)
  Anesthesia Post-op Note  Patient: Madeline Fox  Procedure(s) Performed: Procedure(s) (LRB): THYROIDECTOMY (N/A)  Patient Location: PACU  Anesthesia Type: General  Level of Consciousness: awake and alert   Airway and Oxygen Therapy: Patient Spontanous Breathing  Post-op Pain: mild  Post-op Assessment: Post-op Vital signs reviewed, Patient's Cardiovascular Status Stable, Respiratory Function Stable, Patent Airway and No signs of Nausea or vomiting  Post-op Vital Signs: stable  Complications: No apparent anesthesia complications

## 2011-11-20 ENCOUNTER — Telehealth (INDEPENDENT_AMBULATORY_CARE_PROVIDER_SITE_OTHER): Payer: Self-pay

## 2011-11-20 ENCOUNTER — Encounter (HOSPITAL_COMMUNITY): Payer: Self-pay | Admitting: Surgery

## 2011-11-20 LAB — CBC
MCH: 30.7 pg (ref 26.0–34.0)
MCHC: 33.9 g/dL (ref 30.0–36.0)
MCV: 90.7 fL (ref 78.0–100.0)
Platelets: 163 10*3/uL (ref 150–400)
RBC: 3.22 MIL/uL — ABNORMAL LOW (ref 3.87–5.11)
RDW: 12.7 % (ref 11.5–15.5)

## 2011-11-20 LAB — BASIC METABOLIC PANEL
CO2: 26 mEq/L (ref 19–32)
Calcium: 9 mg/dL (ref 8.4–10.5)
Creatinine, Ser: 1.03 mg/dL (ref 0.50–1.10)
GFR calc non Af Amer: 55 mL/min — ABNORMAL LOW (ref >90)

## 2011-11-20 MED ORDER — HYDROCODONE-ACETAMINOPHEN 5-325 MG PO TABS: 1.0000 | ORAL_TABLET | ORAL | Status: DC | PRN

## 2011-11-20 MED ORDER — CALCIUM CARBONATE 1250 (500 CA) MG PO TABS: 2.0000 | ORAL_TABLET | Freq: Two times a day (BID) | ORAL | Status: DC

## 2011-11-20 MED ORDER — SYNTHROID 88 MCG PO TABS: 88.0000 ug | ORAL_TABLET | Freq: Every day | ORAL | Status: DC

## 2011-11-20 NOTE — Discharge Summary (Signed)
Physician Discharge Summary [facility]  Patient ID: Madeline Fox MRN: [id number] DOB/AGE: 08/25/1945 66 y.o.  Admit date: 11/19/2011 Discharge date: 11/20/2011  Admission Diagnoses:  Thyroid goiter with borderline hyperthyroidism  Discharge Diagnoses:  Principal Problem:  *Goiter diffuse, nontoxic Active Problems:  Hyperthyroidism, subclinical   Discharged Condition: good  Hospital Course: patient admitted for observation after total thyroidectomy.  Post op course stable.  Calcium level 9.0 on morning following surgery.  Prepared for discharge POD#1.  Consults: None  Significant Diagnostic Studies: labs: BMET  Treatments: surgery: total thyroidectomy  Discharge Exam: Blood pressure 142/65, pulse 86, temperature 99.2 F (37.3 C), temperature source Oral, resp. rate 18, height 5' 2.5" (1.588 m), weight 174 lb (78.926 kg), SpO2 93.00%. HEENT - clear Neck - wound clear and dry; mild STS; voice normal Chest - clear  Disposition: Home with family  Discharge Orders    Future Appointments: Provider: Department: Dept Phone: Center:   12/09/2011 11:00 AM Velora Heckler, MD [facility] [phone] None     Future Orders Please Complete By Expires   Diet - low sodium heart healthy      Increase activity slowly      Discharge instructions      Comments:   [facility], [place] [phone]  THYROID & PARATHYROID SURGERY -- POST OP INSTRUCTIONS  Always review your discharge instruction sheet from the facility where your surgery was performed.  A prescription for pain medication may be given to you upon discharge.  Take your pain medication as prescribed.  If narcotic pain medicine is not needed, then you may take acetaminophen (Tylenol) or ibuprofen (Advil) as needed. Take your usually prescribed medications unless otherwise directed. If you need a refill on your pain medication, please contact your pharmacy. They will contact our office to  request authorization.  Prescriptions will not be processed after 5 pm or on weekends. Start with a light diet upon arrival home, such as soup and crackers or toast.  Be sure to drink plenty of fluids daily.  Resume your normal diet the day after surgery. Most patients will experience some swelling and bruising on the chest and neck area.  Ice packs will help.  Swelling and bruising can take several days to resolve.  It is common to experience some constipation if taking pain medication after surgery.  Increasing fluid intake and taking a stool softener will usually help or prevent this problem.  A mild laxative (Milk of Magnesia or Miralax) should be taken according to package directions if there are no bowel movements after 48 hours. You may remove your bandages 24-48 hours after surgery, and you may shower at that time.  You have steri-strips (small skin tapes) in place directly over the incision.  These strips should be left on the skin for 7-10 days and then removed. You may resume regular (light) daily activities beginning the next day-such as daily self-care, walking, climbing stairs-gradually increasing activities as tolerated.  You may have sexual intercourse when it is comfortable.  Refrain from any heavy lifting or straining until approved by your doctor.  You may drive when you no longer are taking prescription pain medication, you can comfortably wear a seatbelt, and you can safely maneuver your car and apply brakes. You should see your doctor in the office for a follow-up appointment approximately two to three weeks after your surgery.  Make sure that you call for this appointment within a day or two after you arrive home to insure  a convenient appointment time.  WHEN TO CALL YOUR DOCTOR: Fever greater than 101.5 Inability to urinate Nausea and/or vomiting - persistent Extreme swelling or bruising Continued bleeding from incision Increased pain, redness, or drainage from the  incision Difficulty swallowing or breathing Muscle cramping or spasms Numbness or tingling in hands or around lips  The clinic staff is available to answer your questions during regular business hours.  Please don't hesitate to call and ask to speak to one of the nurses if you have concerns. [facility], [place] [phone]  [email or web address]   Remove dressing in 24 hours          Medication List     As of 11/20/2011  9:18 AM    TAKE these medications         acetaminophen 500 MG tablet   Commonly known as: TYLENOL   Take 500 mg by mouth every 6 (six) hours as needed. For pain      aspirin 81 MG tablet   Take 81 mg by mouth every morning.      benazepril 20 MG tablet   Commonly known as: LOTENSIN   Take 10 mg by mouth every morning.      calcium carbonate 1250 MG tablet   Commonly known as: OS-CAL - dosed in mg of elemental calcium   Take 2 tablets (1,000 mg of elemental calcium total) by mouth 2 (two) times daily.      cetirizine 10 MG tablet   Commonly known as: ZYRTEC   Take 10 mg by mouth at bedtime.      ferrous sulfate 325 (65 FE) MG tablet   Take 325 mg by mouth 2 (two) times daily.      glipiZIDE 10 MG tablet   Commonly known as: GLUCOTROL   Take 10 mg by mouth 2 (two) times daily before a meal.      hydrochlorothiazide 25 MG tablet   Commonly known as: HYDRODIURIL   Take 25 mg by mouth every morning.      HYDROcodone-acetaminophen 5-325 MG per tablet   Commonly known as: NORCO/VICODIN   Take 1-2 tablets by mouth every 4 (four) hours as needed for pain.      lovastatin 40 MG tablet   Commonly known as: MEVACOR   Take 40 mg by mouth at bedtime.      metFORMIN 1000 MG tablet   Commonly known as: GLUCOPHAGE   Take 1,000 mg by mouth 2 (two) times daily with a meal.      sitaGLIPtin 100 MG tablet   Commonly known as: JANUVIA   Take 100 mg by mouth daily.      sodium chloride 0.65 % nasal spray   Commonly known as: OCEAN    Place 1 spray into the nose as needed. For nasal congestion      SYNTHROID 88 MCG tablet   Generic drug: levothyroxine   Take 1 tablet (88 mcg total) by mouth daily.      vitamin B-12 1000 MCG tablet   Commonly known as: CYANOCOBALAMIN   Take 1,000 mcg by mouth daily.       Velora Heckler, MD, FACS General & Endocrine Surgery [facility] Office: [phone]   Signed: Velora Heckler 11/20/2011, 9:18 AM

## 2011-11-20 NOTE — Telephone Encounter (Signed)
I spoke with spouse and gave him date of appt with Dr Naaman Plummer for 12-11-11 at 2:30pm per Dr Ardine Eng request. Notes faxed to Dr Hyacinth Meeker.

## 2011-11-20 NOTE — Op Note (Signed)
NAMESHEY, Madeline NO.:  [id number]  MEDICAL RECORD NO.:  [id number]  LOCATION:  1534                         FACILITY:  [facility]  PHYSICIAN:  Velora Heckler, MD      DATE OF BIRTH:  February 06, 1945  DATE OF PROCEDURE:  11/19/2011                               OPERATIVE REPORT   POSTOPERATIVE DIAGNOSIS:  Thyroid goiter with hyperthyroidism.  POSTOPERATIVE DIAGNOSIS:  Thyroid goiter with hyperthyroidism.  PROCEDURE:  Total thyroidectomy.  SURGEON:  Velora Heckler, MD, FACS  ANESTHESIA:  General.  ESTIMATED BLOOD LOSS:  50 mL.  PREPARATION:  ChloraPrep.  COMPLICATIONS:  None.  INDICATIONS:  The patient is a 66 year old white female, referred by her endocrinologist for an enlarging thyroid goiter and borderline hyperthyroidism.  The patient had a long-standing goiter dating back 35 years.  This has gradually increased in size.  The patient was treated with radioactive iodine in March 2011, for hyperthyroidism.  She continues to have suppressed TSH levels.  The patient now comes to surgery for thyroidectomy for management of thyroid goiter with compressive symptoms and borderline hyperthyroidism.  DESCRIPTION OF PROCEDURE:  Procedures done in OR #1 at the [facility].  The patient was brought to the operating room, placed in supine position on the operating room table.  Following administration of general anesthesia, the patient was positioned and then prepped and draped in the usual strict aseptic fashion.  After ascertaining an adequate level of anesthesia had been achieved, a Kocher incision was made with a #15 blade.  Dissection was carried through subcutaneous tissue and platysma.  Hemostasis was obtained with the electrocautery.  Skin flaps were elevated cephalad and caudad from the thyroid notch to the sternal notch.  The Mahorner self-retaining retractor was placed for exposure.  Strap muscles were incised in the midline.   Dissection was begun on the left.  Left lobe was markedly enlarged.  It was multinodular.  It was gently mobilized from the surrounding strap muscles.  Middle thyroid vein was divided between Ligaclips with the Harmonic Scalpel.  Using gentle blunt dissection, the lobe was mobilized.  Superior pole vessels were identified and dissected out.  They are divided individually between small and medium Ligaclips with the Harmonic Scalpel.  Gland was rolled anteriorly.  Inferior venous tributaries were divided between Ligaclips with the Harmonic scalpel.  Branches of the inferior thyroid artery were divided between small and medium Ligaclips with the Harmonic Scalpel.  Gland was rolled further anteriorly.  Recurrent nerve was identified and preserved. Ligament of Allyson Sabal was identified and released with the electrocautery. Gland was mobilized up and onto the anterior trachea.  Isthmus was mobilized off the anterior surface of the trachea.  There was a moderate sized pyramidal lobe which is mobilized off the thyroid cartilage and resected en bloc with the isthmus.  Dry pack was placed in the left neck.  Next, strap muscles on the right side are elevated and reflected laterally.  Right thyroid lobe was exposed.  Right lobe was gently mobilized with blunt dissection.  Venous tributaries were divided between Ligaclips with the Harmonic Scalpel.  Superior pole was dissected out and superior pole vessels divided individually between small  and medium Ligaclips with the Harmonic Scalpel.  Inferior venous tributaries are dissected out and divided between small and medium Ligaclips with the Harmonic Scalpel.  Gland was rolled anteriorly. Branches of the inferior thyroid artery were divided between small and medium Ligaclips with the Harmonic Scalpel.  Recurrent nerve was identified and preserved.  Ligament of Allyson Sabal was released with the electrocautery and the gland was dissected onto the anterior  trachea from which it was completely excised.  Gland was markedly enlarged.  It was multinodular.  The sutures were used to mark the left superior pole. The entire thyroid gland was submitted to the Pathology for review.  The neck was irrigated with warm saline which was evacuated.  Hemostasis was achieved with the electrocautery.  Surgicel was placed in the operative field bilaterally.  Strap muscles were reapproximated in the midline with interrupted 3-0 Vicryl sutures.  Platysma was closed with interrupted 3-0 Vicryl sutures.  Skin was closed with a running 4-0 Monocryl subcuticular suture.  Wound was washed and dried and benzoin and Steri-Strips were applied.  Sterile dressings were applied.  The patient was awakened from anesthesia and brought to the recovery room. The patient tolerated the procedure well.   Velora Heckler, MD, FACS General & Endocrine Surgery [facility] Office: [phone]   TMG/MEDQ  D:  11/19/2011  T:  11/20/2011  Job:  [id number]  cc:   Silver Huguenin, MD Fax: [phone]

## 2011-11-23 ENCOUNTER — Telehealth (INDEPENDENT_AMBULATORY_CARE_PROVIDER_SITE_OTHER): Payer: Self-pay

## 2011-11-23 NOTE — Telephone Encounter (Signed)
Pt notified of path result. Pt has received lab slip and po appt date.

## 2011-11-25 NOTE — Progress Notes (Signed)
Quick Note:  Please contact patient and notify of benign pathology results.  Reagyn Facemire M. Zamarion Longest, MD, FACS [facility] Office: [phone]   ______ 

## 2011-12-09 ENCOUNTER — Ambulatory Visit (INDEPENDENT_AMBULATORY_CARE_PROVIDER_SITE_OTHER): Payer: Medicare Other | Admitting: Surgery

## 2011-12-09 ENCOUNTER — Encounter (INDEPENDENT_AMBULATORY_CARE_PROVIDER_SITE_OTHER): Payer: Self-pay | Admitting: Surgery

## 2011-12-09 VITALS — BP 148/62 | HR 76 | Temp 97.4°F | Resp 16 | Ht 62.0 in | Wt 167.4 lb

## 2011-12-09 DIAGNOSIS — E040 Nontoxic diffuse goiter: Secondary | ICD-10-CM

## 2011-12-09 DIAGNOSIS — E890 Postprocedural hypothyroidism: Secondary | ICD-10-CM

## 2011-12-09 DIAGNOSIS — E049 Nontoxic goiter, unspecified: Secondary | ICD-10-CM

## 2011-12-09 MED ORDER — LEVOTHYROXINE SODIUM 50 MCG PO TABS: 50.0000 ug | ORAL_TABLET | Freq: Every day | ORAL | Status: DC

## 2011-12-09 NOTE — Patient Instructions (Signed)
  COCOA BUTTER & VITAMIN E CREAM  ([employer]mer[employer] or other brand)  Apply cocoa butter/vitamin E cream to your incision 2 - 3 times daily.  Massage cream into incision for one minute with each application.  Use sunscreen (50 SPF or higher) for first 6 months after surgery if area is exposed to sun.  You may substitute Mederma or other scar reducing creams as desired.   

## 2011-12-09 NOTE — Progress Notes (Signed)
General Surgery [facility]  Visit Diagnoses: 1. Goiter diffuse, nontoxic   2. Hypothyroidism, postsurgical     HISTORY: Patient returns for her first postoperative visit having undergone total thyroidectomy on 11/19/2011. 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 There is no evidence of atypia or malignancy. Postoperative calcium level is normal at 9.6.  Patient developed tremors and anxiety on her postoperative dosage of Synthroid 88 mcg daily. Medication was discontinued 3 days ago. I will start her back on a lower dose today.  EXAM: Surgical wound is healing nicely. Minimal soft tissue swelling. Voice quality is normal. No sign of infection. No sign of seroma.  IMPRESSION: Status post total thyroidectomy for benign hyperplastic nodules with dystrophic calcifications  PLAN: Patient will start on Synthroid 50 mcg daily. We will check a TSH level in one month. She will discontinue supplemental calcium except for a daily dose for bone health. Patient will begin applying topical creams to her incision. She will return to see me for a final wound check in 6-8 weeks.  Velora Heckler, MD, FACS General & Endocrine Surgery [facility]

## 2012-01-14 ENCOUNTER — Ambulatory Visit: Payer: Self-pay | Admitting: Internal Medicine

## 2012-01-15 ENCOUNTER — Telehealth (INDEPENDENT_AMBULATORY_CARE_PROVIDER_SITE_OTHER): Payer: Self-pay

## 2012-01-15 NOTE — Telephone Encounter (Signed)
Pt notified labs here but will be sent to Dr Gerrit Friends to review for change in medication.

## 2012-01-25 ENCOUNTER — Ambulatory Visit: Payer: Self-pay | Admitting: Internal Medicine

## 2012-01-25 LAB — CBC CANCER CENTER
Basophil %: 0.5 %
Eosinophil %: 1.1 %
MCH: 30 pg (ref 26.0–34.0)
MCV: 92 fL (ref 80–100)
Monocyte #: 0.3 x10 3/mm (ref 0.2–0.9)
Neutrophil %: 76.7 %

## 2012-01-25 LAB — IRON AND TIBC: Unbound Iron-Bind.Cap.: 193 ug/dL

## 2012-01-25 LAB — HEPATIC FUNCTION PANEL A (ARMC)
Bilirubin, Direct: 0.1 mg/dL (ref 0.00–0.20)
Bilirubin,Total: 0.5 mg/dL (ref 0.2–1.0)
SGPT (ALT): 20 U/L (ref 12–78)
Total Protein: 7.8 g/dL (ref 6.4–8.2)

## 2012-01-27 ENCOUNTER — Ambulatory Visit: Payer: Self-pay | Admitting: Internal Medicine

## 2012-01-28 ENCOUNTER — Telehealth (INDEPENDENT_AMBULATORY_CARE_PROVIDER_SITE_OTHER): Payer: Self-pay

## 2012-01-28 NOTE — Telephone Encounter (Signed)
The pt had her thyroid surgery about 2 months ago.  She states over the past 2 weeks she has had a choking sensation.  It is not progressively getting worse.  She has an appointment 1/6.  She is a little nervous.  I told her since it's no worse to come in Monday and Dr Gerrit Friends will check her.  I said if it was worse over last day or 2, or if having difficulty breathing to go to the ER.  She also has a head ache and it hurts near her right ear.  She said maybe it's a sinus infection.  I said in that case to run it by her primary md.

## 2012-02-01 ENCOUNTER — Encounter (INDEPENDENT_AMBULATORY_CARE_PROVIDER_SITE_OTHER): Payer: Self-pay | Admitting: Surgery

## 2012-02-01 ENCOUNTER — Ambulatory Visit (INDEPENDENT_AMBULATORY_CARE_PROVIDER_SITE_OTHER): Payer: Medicare Other | Admitting: Surgery

## 2012-02-01 VITALS — BP 140/62 | HR 76 | Temp 97.2°F | Resp 16 | Ht 63.0 in | Wt 165.2 lb

## 2012-02-01 DIAGNOSIS — E890 Postprocedural hypothyroidism: Secondary | ICD-10-CM

## 2012-02-01 DIAGNOSIS — E049 Nontoxic goiter, unspecified: Secondary | ICD-10-CM

## 2012-02-01 DIAGNOSIS — E040 Nontoxic diffuse goiter: Secondary | ICD-10-CM

## 2012-02-01 MED ORDER — LEVOTHYROXINE SODIUM 50 MCG PO TABS: 75.0000 ug | ORAL_TABLET | Freq: Every day | ORAL | Status: DC

## 2012-02-01 NOTE — Progress Notes (Signed)
General Surgery [facility]  Visit Diagnoses: 1. Hypothyroidism, postsurgical   2. Goiter diffuse, nontoxic     HISTORY: Patient underwent total thyroidectomy in October 2013. Postoperatively she developed tremors with Synthroid and discontinued the medication. At her last office visit I started her on Synthroid 50 mcg daily. TSH level on 01/15/2012 shows an elevated TSH of 19.23. Patient returns today for followup and adjustment of her medication dosage.  EXAM: Surgical wound is well-healed. Good cosmetic result. Voice quality is normal.  IMPRESSION: Status post total thyroidectomy, postoperative hypothyroidism  PLAN: I am going to increase her dosage of Synthroid to 75 mcg daily. I have asked her to followup with her endocrinologist and her primary care physician. She will need a TSH level in approximately 6 weeks and likely another adjustment to the dosage of her Synthroid.  Patient will return as needed.  Velora Heckler, MD, FACS General & Endocrine Surgery [facility]

## 2012-02-01 NOTE — Patient Instructions (Signed)
Levothyroxine tablets  What is this medicine?  LEVOTHYROXINE (lee voe thye ROX een) is a thyroid hormone. This medicine can improve symptoms of thyroid deficiency such as slow speech, lack of energy, weight gain, hair loss, dry skin, and feeling cold. It also helps to treat goiter (an enlarged thyroid gland). It is also used to treat some kinds of thyroid cancer along with surgery and other medicines.  This medicine may be used for other purposes; ask your health care provider or pharmacist if you have questions.  What should I tell my health care provider before I take this medicine?  They need to know if you have any of these conditions:  -angina  -blood clotting problems  -diabetes  -dieting or on a weight loss program  -fertility problems  -heart disease  -high levels of thyroid hormone  -pituitary gland problem  -previous heart attack  -an unusual or allergic reaction to levothyroxine, thyroid hormones, other medicines, foods, dyes, or preservatives  -pregnant or trying to get pregnant  -breast-feeding  How should I use this medicine?  Take this medicine by mouth with plenty of water. It is best to take on an empty stomach, at least 30 minutes before or 2 hours after food. Follow the directions on the prescription label. Take at the same time each day. Do not take your medicine more often than directed.  Contact your pediatrician regarding the use of this medicine in children. While this drug may be prescribed for children and infants as young as a few days of age for selected conditions, precautions do apply. For infants, you may crush the tablet and place in a small amount of (5-10 ml or 1 to 2 teaspoonfuls) of water, breast milk, or non-soy based infant formula. Do not mix with soy-based infant formula. Give as directed.  Overdosage: If you think you have taken too much of this medicine contact a poison control center or emergency room at once.   NOTE: This medicine is only for you. Do not share this medicine with others.  What if I miss a dose?  If you miss a dose, take it as soon as you can. If it is almost time for your next dose, take only that dose. Do not take double or extra doses.  What may interact with this medicine?  -amiodarone  -antacids  -anti-thyroid medicines  -calcium supplements  -carbamazepine  -cholestyramine  -colestipol  -digoxin  -female hormones, including contraceptive or birth control pills  -iron supplements  -ketamine  -liquid nutrition products like Ensure  -medicines for colds and breathing difficulties  -medicines for diabetes  -medicines for mental depression  -medicines or herbals used to decrease weight or appetite  -phenobarbital or other barbiturate medications  -phenytoin  -prednisone or other corticosteroids  -rifabutin  -rifampin  -soy isoflavones  -sucralfate  -theophylline  -warfarin  This list may not describe all possible interactions. Give your health care provider a list of all the medicines, herbs, non-prescription drugs, or dietary supplements you use. Also tell them if you smoke, drink alcohol, or use illegal drugs. Some items may interact with your medicine.  What should I watch for while using this medicine?  Be sure to take this medicine with plenty of fluids. Some tablets may cause choking, gagging, or difficulty swallowing from the tablet getting stuck in your throat. Most of these problems disappear if the medicine is taken with the right amount of water or other fluids.  Do not switch brands of this medicine unless   your health care professional agrees with the change. Ask questions if you are uncertain.  You will need regular exams and occasional blood tests to check the response to treatment. If you are receiving this medicine for an underactive thyroid, it may be several weeks before you notice an improvement. Check with your doctor or health care professional if your symptoms do not improve.   It may be necessary for you to take this medicine for the rest of your life. Do not stop using this medicine unless your doctor or health care professional advises you to.  This medicine can affect blood sugar levels. If you have diabetes, check your blood sugar as directed.  You may lose some of your hair when you first start treatment. With time, this usually corrects itself.  If you are going to have surgery, tell your doctor or health care professional that you are taking this medicine.  What side effects may I notice from receiving this medicine?  Side effects that you should report to your doctor or health care professional as soon as possible:  -allergic reactions like skin rash, itching or hives, swelling of the face, lips, or tongue  -chest pain  -excessive sweating or intolerance to heat  -fast or irregular heartbeat  -nervousness  -skin rash or hives  -swelling of ankles, feet, or legs  -tremors  Side effects that usually do not require medical attention (report to your doctor or health care professional if they continue or are bothersome):  -changes in appetite  -changes in menstrual periods  -diarrhea  -hair loss  -headache  -trouble sleeping  -weight loss  This list may not describe all possible side effects. Call your doctor for medical advice about side effects. You may report side effects to FDA at 1-800-FDA-1088.  Where should I keep my medicine?  Keep out of the reach of children.  Store at room temperature between 15 and 30 degrees C (59 and 86 degrees F). Protect from light and moisture. Keep container tightly closed. Throw away any unused medicine after the expiration date.  NOTE: This sheet is a summary. It may not cover all possible information. If you have questions about this medicine, talk to your doctor, pharmacist, or health care provider.   2013, [employer] Standard. (04/20/2008 2:28:07 PM)

## 2012-05-05 ENCOUNTER — Ambulatory Visit: Payer: Self-pay

## 2012-06-13 ENCOUNTER — Telehealth (INDEPENDENT_AMBULATORY_CARE_PROVIDER_SITE_OTHER): Payer: Self-pay

## 2012-06-13 NOTE — Telephone Encounter (Signed)
I have forwarded the questions [id number] with Dr. Gerrit Friends to him to review and advise. Awaiting his recommendation re:pt's f/u needs.

## 2012-06-14 ENCOUNTER — Telehealth (INDEPENDENT_AMBULATORY_CARE_PROVIDER_SITE_OTHER): Payer: Self-pay

## 2012-06-14 NOTE — Telephone Encounter (Signed)
Pt calling in again to check on the message she left yesterday about needing a doctor to follow her for the thyroid issues since her doctor in [place] is no longer seeing pt's. Pls call.

## 2012-06-15 ENCOUNTER — Telehealth (INDEPENDENT_AMBULATORY_CARE_PROVIDER_SITE_OTHER): Payer: Self-pay

## 2012-06-15 NOTE — Telephone Encounter (Signed)
I have attempted to return pts call but phone # d/c. We need to update phone # once pt calls back.  Per Dr Gerrit Friends we can refer pt to Dr Sharl Ma for follow up of thyroid meds and labs. Will await pt to call back to advise.

## 2012-06-15 NOTE — Telephone Encounter (Signed)
This request as well as original request has been forwarded to Dr Gerrit Friends for review and response.

## 2012-06-27 ENCOUNTER — Ambulatory Visit: Payer: Self-pay | Admitting: Internal Medicine

## 2012-07-26 ENCOUNTER — Ambulatory Visit: Payer: Self-pay | Admitting: Internal Medicine

## 2013-01-16 ENCOUNTER — Ambulatory Visit: Payer: Self-pay | Admitting: Internal Medicine

## 2013-02-03 ENCOUNTER — Ambulatory Visit: Payer: Self-pay | Admitting: Internal Medicine

## 2013-02-03 LAB — CBC CANCER CENTER
Basophil #: 0 x10 3/mm (ref 0.0–0.1)
Basophil %: 0.5 %
EOS PCT: 1.6 %
Eosinophil #: 0.1 x10 3/mm (ref 0.0–0.7)
HCT: 31.1 % — ABNORMAL LOW (ref 35.0–47.0)
HGB: 10.5 g/dL — ABNORMAL LOW (ref 12.0–16.0)
LYMPHS ABS: 0.9 x10 3/mm — AB (ref 1.0–3.6)
Lymphocyte %: 16.6 %
MCH: 31.2 pg (ref 26.0–34.0)
MCHC: 33.8 g/dL (ref 32.0–36.0)
MCV: 93 fL (ref 80–100)
MONOS PCT: 5.3 %
Monocyte #: 0.3 x10 3/mm (ref 0.2–0.9)
NEUTROS ABS: 4.1 x10 3/mm (ref 1.4–6.5)
Neutrophil %: 76 %
Platelet: 174 x10 3/mm (ref 150–440)
RBC: 3.37 10*6/uL — AB (ref 3.80–5.20)
RDW: 14.4 % (ref 11.5–14.5)
WBC: 5.5 x10 3/mm (ref 3.6–11.0)

## 2013-02-03 LAB — HEPATIC FUNCTION PANEL A (ARMC)
ALBUMIN: 3.9 g/dL (ref 3.4–5.0)
ALK PHOS: 83 U/L
Bilirubin, Direct: 0.1 mg/dL (ref 0.00–0.20)
Bilirubin,Total: 0.3 mg/dL (ref 0.2–1.0)
SGOT(AST): 17 U/L (ref 15–37)
SGPT (ALT): 17 U/L (ref 12–78)
Total Protein: 7.6 g/dL (ref 6.4–8.2)

## 2013-02-03 LAB — IRON AND TIBC
IRON BIND. CAP.(TOTAL): 231 ug/dL — AB (ref 250–450)
IRON: 46 ug/dL — AB (ref 50–170)
Iron Saturation: 20 %
Unbound Iron-Bind.Cap.: 185 ug/dL

## 2013-02-03 LAB — CREATININE, SERUM
CREATININE: 1.53 mg/dL — AB (ref 0.60–1.30)
EGFR (African American): 40 — ABNORMAL LOW
GFR CALC NON AF AMER: 34 — AB

## 2013-02-03 LAB — FERRITIN: Ferritin (ARMC): 94 ng/mL (ref 8–388)

## 2013-02-26 ENCOUNTER — Ambulatory Visit: Payer: Self-pay | Admitting: Internal Medicine

## 2013-10-16 IMAGING — MG MAM DGTL DIAGNOSTIC MAMMO W/CAD
1 series · 7 of 7 positions shown · non-contrast
Comparison: none

REASON FOR EXAM: hx br ca  yrly
COMMENTS:

PROCEDURE:     MAM - MAM DGTL DIAGNOSTIC MAMMO W/CAD  - November 18, 2010  [DATE]
RESULT:
HISTORY: There is no family history of breast cancer. The patient has a
history of left breast cancer in 3467, receiving chemotherapy and radiation
therapy. There is a history of left breast lumpectomy in August 2005.

[Series 5695: R CC · right · 7 of 7 slices shown]
[im 1/7]
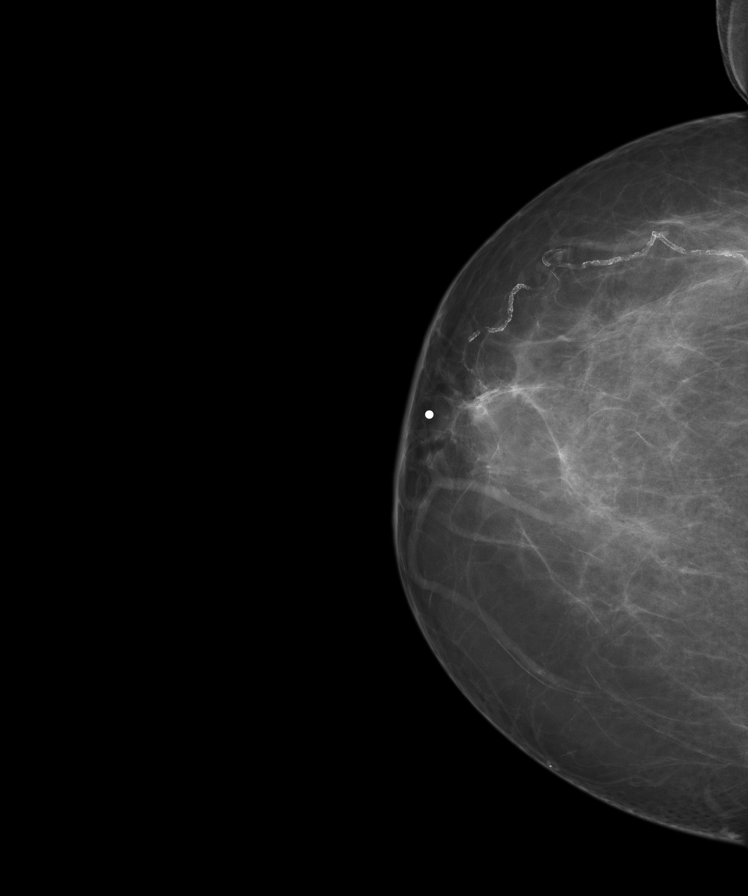
[im 2/7]
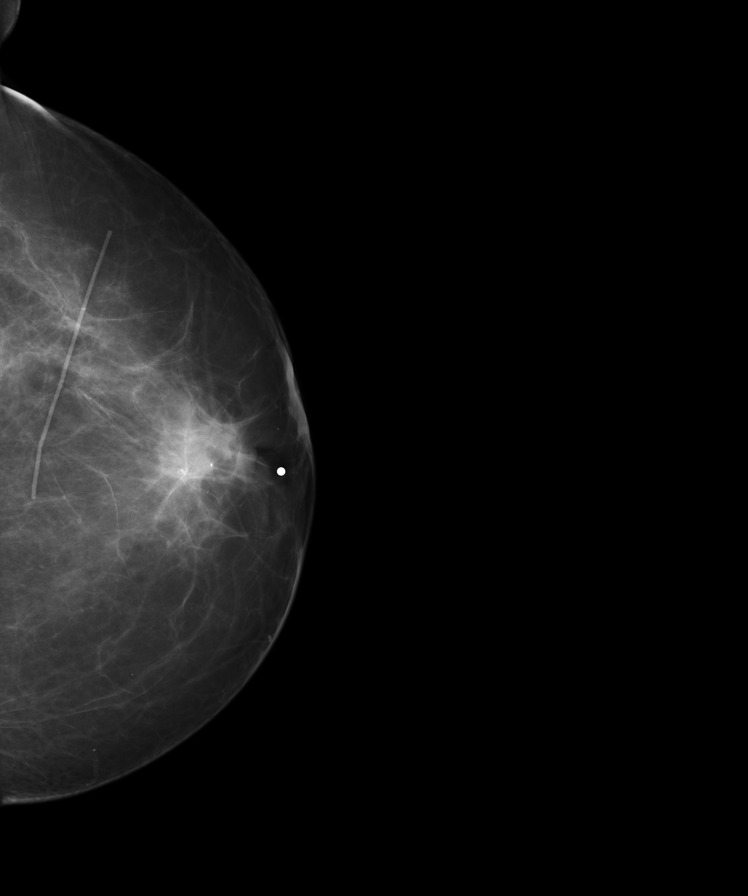
[im 3/7]
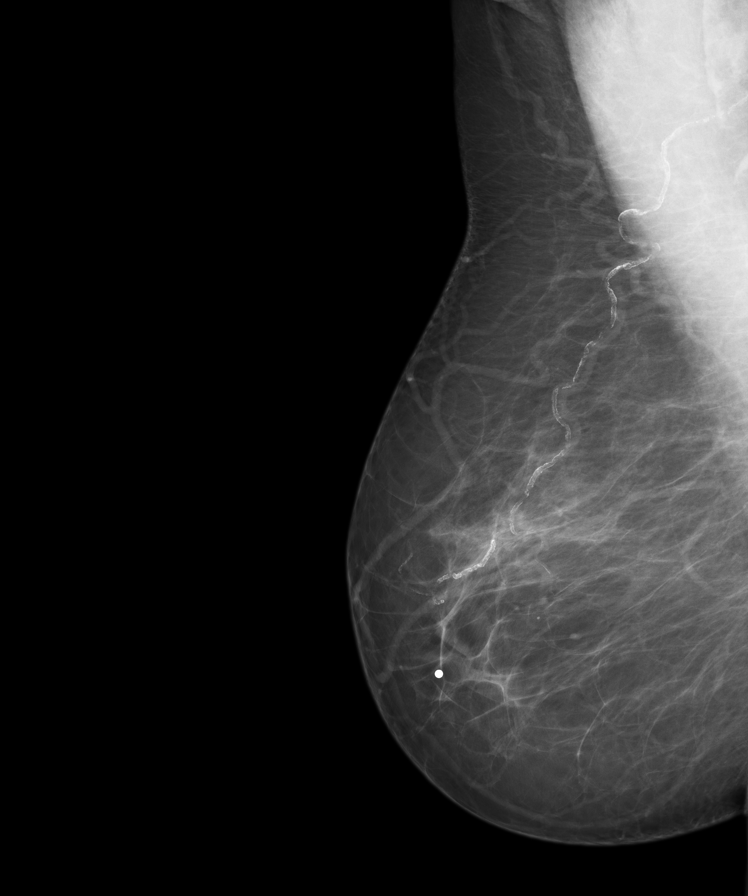
[im 4/7]
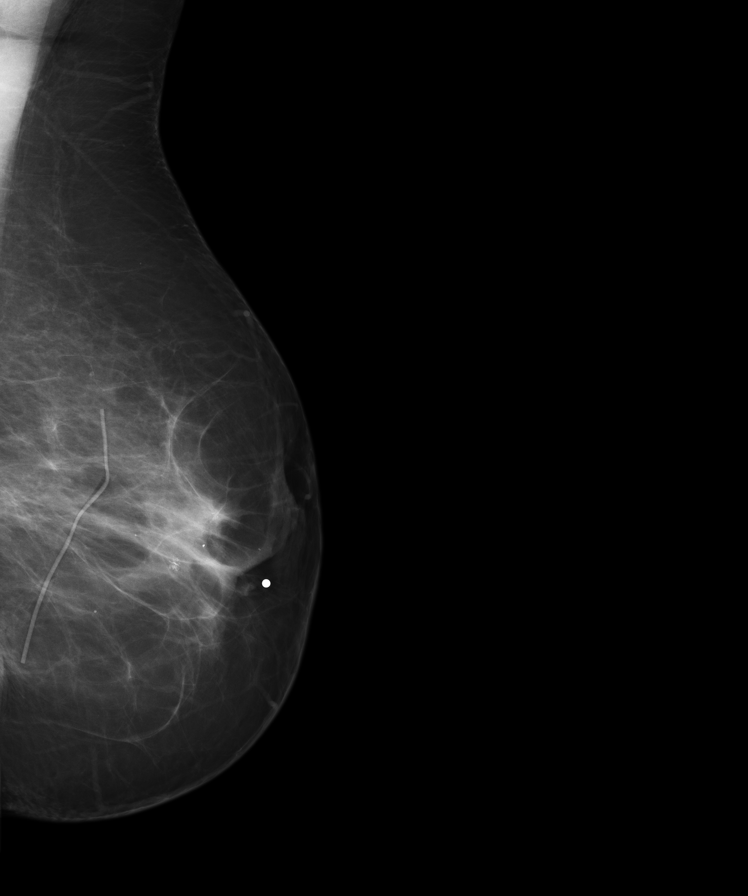
[im 5/7]
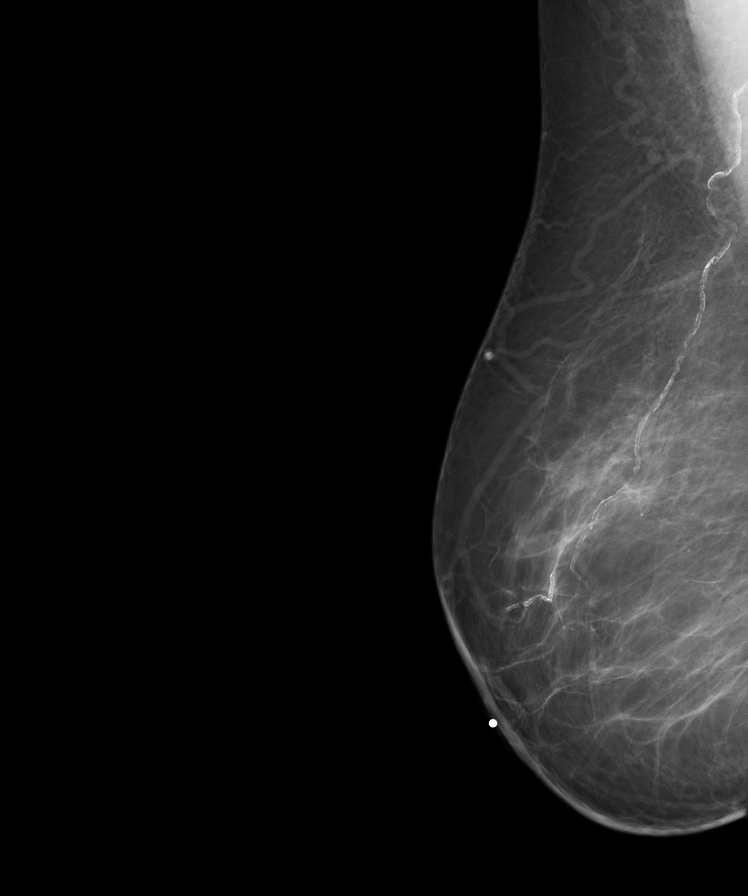
[im 6/7]
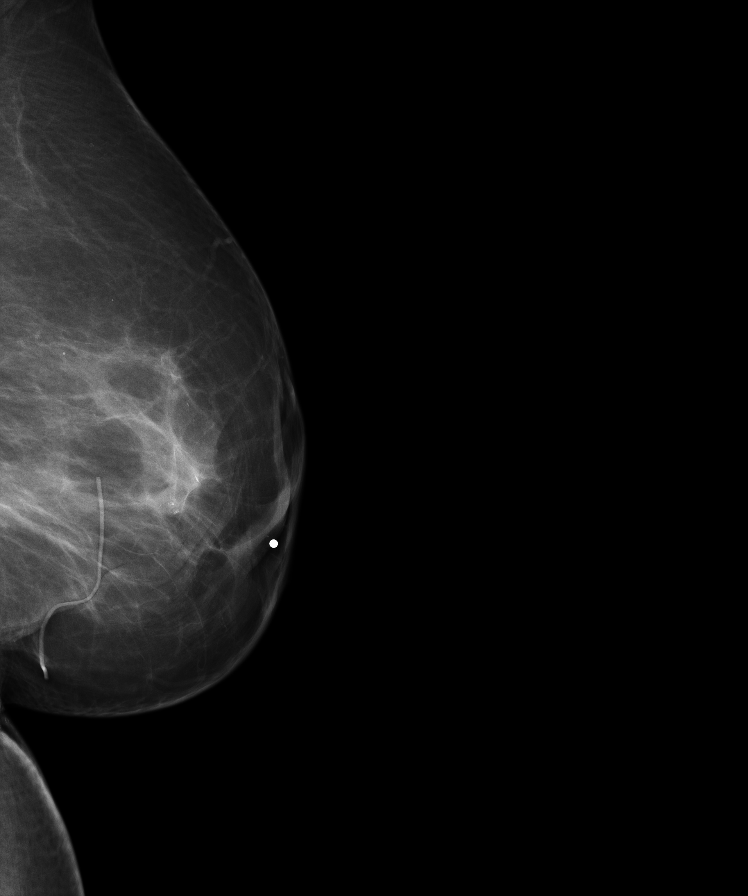
[im 7/7]
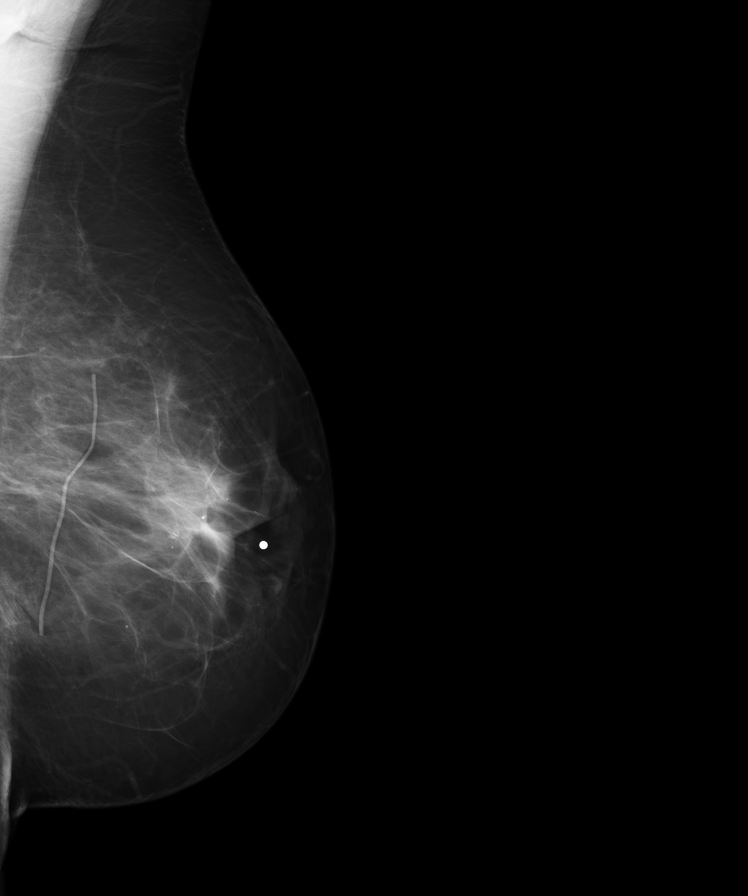

[7 of 7 positions shown; findings below may reference images not displayed]

Comparison is made to analog images of 03/15/2002 and digital images dated
11/13/2009 as well as 11/12/2008 and 05/30/2003.

The breasts exhibit a mild to moderately dense parenchymal pattern with a
scar marker present in the posterolateral mid left breast. There is no
developing parenchymal density or dominant mass. No malignant calcification
or architectural distortion aside from the surgical change is noted. Nipple
markers are placed bilaterally.
IMPRESSION: Post operative changes on the left. Stable, benign
appearing bilateral mammogram.

BI-RADS: Category 2 - Benign Findings

RECOMMENDATION:  Please continue to encourage annual mammographic follow-up.

Thank you for this opportunity to contribute to the care of your patient.

A NEGATIVE MAMMOGRAM REPORT DOES NOT PRECLUDE BIOPSY OR OTHER EVALUATION OF
A CLINICALLY PALPABLE OR OTHERWISE SUSPICIOUS MASS OR LESION. BREAST CANCER
MAY NOT BE DETECTED BY MAMMOGRAPHY IN UP TO 10% OF CASES.

## 2014-02-08 ENCOUNTER — Ambulatory Visit: Payer: Self-pay | Admitting: Physician Assistant

## 2014-12-13 ENCOUNTER — Other Ambulatory Visit: Payer: Self-pay | Admitting: Physician Assistant

## 2014-12-13 DIAGNOSIS — Z1231 Encounter for screening mammogram for malignant neoplasm of breast: Secondary | ICD-10-CM

## 2015-02-11 ENCOUNTER — Other Ambulatory Visit: Payer: Self-pay | Admitting: Physician Assistant

## 2015-02-11 ENCOUNTER — Ambulatory Visit
Admission: RE | Admit: 2015-02-11 | Discharge: 2015-02-11 | Disposition: A | Discharge status: Home / Self Care | Payer: Medicare Other | Source: Ambulatory Visit | Attending: Physician Assistant | Admitting: Physician Assistant

## 2015-02-11 DIAGNOSIS — Z1231 Encounter for screening mammogram for malignant neoplasm of breast: Secondary | ICD-10-CM | POA: Diagnosis not present

## 2015-02-11 HISTORY — DX: Personal history of irradiation: Z92.3

## 2015-02-11 HISTORY — DX: Personal history of antineoplastic chemotherapy: Z92.21

## 2015-02-11 HISTORY — DX: Malignant neoplasm of unspecified site of unspecified female breast: C50.919

## 2015-04-11 ENCOUNTER — Encounter: Payer: Self-pay | Admitting: *Deleted

## 2015-04-12 ENCOUNTER — Encounter
Admission: RE | Disposition: A | Discharge status: Home / Self Care | Payer: Self-pay | Source: Ambulatory Visit | Attending: Gastroenterology

## 2015-04-12 ENCOUNTER — Ambulatory Visit: Payer: Medicare Other | Admitting: Anesthesiology

## 2015-04-12 ENCOUNTER — Encounter: Payer: Self-pay | Admitting: Anesthesiology

## 2015-04-12 ENCOUNTER — Ambulatory Visit
Admission: RE | Admit: 2015-04-12 | Discharge: 2015-04-12 | Disposition: A | Discharge status: Home / Self Care | Payer: Medicare Other | Source: Ambulatory Visit | Attending: Gastroenterology | Admitting: Gastroenterology

## 2015-04-12 DIAGNOSIS — R011 Cardiac murmur, unspecified: Secondary | ICD-10-CM | POA: Diagnosis not present

## 2015-04-12 DIAGNOSIS — D696 Thrombocytopenia, unspecified: Secondary | ICD-10-CM | POA: Insufficient documentation

## 2015-04-12 DIAGNOSIS — E119 Type 2 diabetes mellitus without complications: Secondary | ICD-10-CM | POA: Insufficient documentation

## 2015-04-12 DIAGNOSIS — G629 Polyneuropathy, unspecified: Secondary | ICD-10-CM | POA: Insufficient documentation

## 2015-04-12 DIAGNOSIS — M1990 Unspecified osteoarthritis, unspecified site: Secondary | ICD-10-CM | POA: Insufficient documentation

## 2015-04-12 DIAGNOSIS — Z923 Personal history of irradiation: Secondary | ICD-10-CM | POA: Insufficient documentation

## 2015-04-12 DIAGNOSIS — K5730 Diverticulosis of large intestine without perforation or abscess without bleeding: Secondary | ICD-10-CM | POA: Diagnosis not present

## 2015-04-12 DIAGNOSIS — R351 Nocturia: Secondary | ICD-10-CM | POA: Insufficient documentation

## 2015-04-12 DIAGNOSIS — Z1211 Encounter for screening for malignant neoplasm of colon: Secondary | ICD-10-CM | POA: Diagnosis present

## 2015-04-12 DIAGNOSIS — Z79899 Other long term (current) drug therapy: Secondary | ICD-10-CM | POA: Insufficient documentation

## 2015-04-12 DIAGNOSIS — Z8249 Family history of ischemic heart disease and other diseases of the circulatory system: Secondary | ICD-10-CM | POA: Insufficient documentation

## 2015-04-12 DIAGNOSIS — L409 Psoriasis, unspecified: Secondary | ICD-10-CM | POA: Diagnosis not present

## 2015-04-12 DIAGNOSIS — Z7984 Long term (current) use of oral hypoglycemic drugs: Secondary | ICD-10-CM | POA: Diagnosis not present

## 2015-04-12 DIAGNOSIS — I10 Essential (primary) hypertension: Secondary | ICD-10-CM | POA: Insufficient documentation

## 2015-04-12 DIAGNOSIS — Z809 Family history of malignant neoplasm, unspecified: Secondary | ICD-10-CM | POA: Diagnosis not present

## 2015-04-12 DIAGNOSIS — Z90710 Acquired absence of both cervix and uterus: Secondary | ICD-10-CM | POA: Insufficient documentation

## 2015-04-12 DIAGNOSIS — Z9049 Acquired absence of other specified parts of digestive tract: Secondary | ICD-10-CM | POA: Diagnosis not present

## 2015-04-12 DIAGNOSIS — E785 Hyperlipidemia, unspecified: Secondary | ICD-10-CM | POA: Diagnosis not present

## 2015-04-12 DIAGNOSIS — Z853 Personal history of malignant neoplasm of breast: Secondary | ICD-10-CM | POA: Diagnosis not present

## 2015-04-12 DIAGNOSIS — Z9221 Personal history of antineoplastic chemotherapy: Secondary | ICD-10-CM | POA: Diagnosis not present

## 2015-04-12 DIAGNOSIS — Z7982 Long term (current) use of aspirin: Secondary | ICD-10-CM | POA: Diagnosis not present

## 2015-04-12 DIAGNOSIS — E890 Postprocedural hypothyroidism: Secondary | ICD-10-CM | POA: Insufficient documentation

## 2015-04-12 DIAGNOSIS — F419 Anxiety disorder, unspecified: Secondary | ICD-10-CM | POA: Insufficient documentation

## 2015-04-12 DIAGNOSIS — D509 Iron deficiency anemia, unspecified: Secondary | ICD-10-CM | POA: Diagnosis not present

## 2015-04-12 DIAGNOSIS — Z8543 Personal history of malignant neoplasm of ovary: Secondary | ICD-10-CM | POA: Diagnosis not present

## 2015-04-12 HISTORY — DX: Diverticulosis of large intestine without perforation or abscess with bleeding: K57.31

## 2015-04-12 HISTORY — DX: Carcinoma in situ of other female genital organs: D07.39

## 2015-04-12 HISTORY — DX: Nontoxic goiter, unspecified: E04.9

## 2015-04-12 HISTORY — DX: Pneumonia, unspecified organism: J18.9

## 2015-04-12 HISTORY — PX: COLONOSCOPY WITH PROPOFOL: SHX5780

## 2015-04-12 HISTORY — DX: Anxiety disorder, unspecified: F41.9

## 2015-04-12 HISTORY — DX: Psoriasis, unspecified: L40.9

## 2015-04-12 HISTORY — DX: Thrombocytopenia, unspecified: D69.6

## 2015-04-12 HISTORY — DX: Hypothyroidism, unspecified: E03.9

## 2015-04-12 HISTORY — DX: Other hemorrhoids: K64.8

## 2015-04-12 LAB — GLUCOSE, CAPILLARY: Glucose-Capillary: 156 mg/dL — ABNORMAL HIGH (ref 65–99)

## 2015-04-12 SURGERY — COLONOSCOPY WITH PROPOFOL
Anesthesia: General

## 2015-04-12 MED ORDER — PROPOFOL 500 MG/50ML IV EMUL
INTRAVENOUS | Status: DC | PRN
  Administered 2015-04-12: 75 ug/kg/min via INTRAVENOUS

## 2015-04-12 MED ORDER — LIDOCAINE HCL (PF) 1 % IJ SOLN
2.0000 mL | Freq: Once | INTRAMUSCULAR | Status: AC
  Administered 2015-04-12: 0.03 mL via INTRADERMAL
  Filled 2015-04-12: qty 2

## 2015-04-12 MED ORDER — FENTANYL CITRATE (PF) 100 MCG/2ML IJ SOLN
INTRAMUSCULAR | Status: DC | PRN
  Administered 2015-04-12 (×2): 50 ug via INTRAVENOUS

## 2015-04-12 MED ORDER — MIDAZOLAM HCL 2 MG/2ML IJ SOLN
INTRAMUSCULAR | Status: DC | PRN
  Administered 2015-04-12 (×2): 1 mg via INTRAVENOUS

## 2015-04-12 MED ORDER — SODIUM CHLORIDE 0.9 % IV SOLN: INTRAVENOUS | Status: DC

## 2015-04-12 MED ORDER — SODIUM CHLORIDE 0.9 % IV SOLN
INTRAVENOUS | Status: DC
  Administered 2015-04-12: 1000 mL via INTRAVENOUS

## 2015-04-12 MED ORDER — PROPOFOL 10 MG/ML IV BOLUS
INTRAVENOUS | Status: DC | PRN
  Administered 2015-04-12: 20 mg via INTRAVENOUS
  Administered 2015-04-12: 60 mg via INTRAVENOUS
  Administered 2015-04-12: 20 mg via INTRAVENOUS

## 2015-04-12 NOTE — Anesthesia Postprocedure Evaluation (Signed)
Anesthesia Post Note  Patient: Madeline Fox  Procedure(s) Performed: Procedure(s) (LRB): COLONOSCOPY WITH PROPOFOL (N/A)  Patient location during evaluation: Endoscopy Anesthesia Type: General Level of consciousness: awake and alert Pain management: pain level controlled Vital Signs Assessment: post-procedure vital signs reviewed and stable Respiratory status: spontaneous breathing, nonlabored ventilation, respiratory function stable and patient connected to nasal cannula oxygen Cardiovascular status: blood pressure returned to baseline and stable Postop Assessment: no signs of nausea or vomiting Anesthetic complications: no    Last Vitals:  Filed Vitals:   04/12/15 0845 04/12/15 0855  BP: 122/54 114/55  Pulse: 59 57  Temp:    Resp: 14 20    Last Pain: There were no vitals filed for this visit.               Precious Haws Fiore Detjen

## 2015-04-12 NOTE — Op Note (Signed)
[facility] Gastroenterology Patient Name: Madeline Fox Procedure Date: 04/12/2015 7:57 AM MRN: [id number] Account #: [id number] Date of Birth: 1945-09-29 Admit Type: Outpatient Age: 70 Room: [facility] ENDO ROOM 4 Gender: Female Note Status: Finalized Procedure:            Colonoscopy Indications:          Screening for colorectal malignant neoplasm Providers:            Lupita Dawn. Candace Cruise, MD Referring MD:         Forest Gleason Md, MD (Referring MD) Medicines:            Monitored Anesthesia Care Complications:        No immediate complications. Procedure:            Pre-Anesthesia Assessment:                       - Prior to the procedure, a History and Physical was                        performed, and patient medications, allergies and                        sensitivities were reviewed. The patient's tolerance of                        previous anesthesia was reviewed.                       - The risks and benefits of the procedure and the                        sedation options and risks were discussed with the                        patient. All questions were answered and informed                        consent was obtained.                       - After reviewing the risks and benefits, the patient                        was deemed in satisfactory condition to undergo the                        procedure.                       After obtaining informed consent, the colonoscope was                        passed under direct vision. Throughout the procedure,                        the patient's blood pressure, pulse, and oxygen                        saturations were monitored continuously. The  Colonoscope was introduced through the anus and                        advanced to the the cecum, identified by appendiceal                        orifice and ileocecal valve. The colonoscopy was                        performed without difficulty. The patient  tolerated the                        procedure well. The quality of the bowel preparation                        was good. Findings:      A few small and large-mouthed diverticula were found in the sigmoid       colon.      The exam was otherwise without abnormality. Impression:           - Diverticulosis in the sigmoid colon.                       - The examination was otherwise normal.                       - No specimens collected. Recommendation:       - Discharge patient to home.                       - The findings and recommendations were discussed with                        the patient. Procedure Code(s):    --- Professional ---                       [phone], Colonoscopy, flexible; diagnostic, including                        collection of specimen(s) by brushing or washing, when                        performed (separate procedure) Diagnosis Code(s):    --- Professional ---                       Z12.11, Encounter for screening for malignant neoplasm                        of colon                       K57.30, Diverticulosis of large intestine without                        perforation or abscess without bleeding CPT copyright 2016 [employer]. All rights reserved. The codes documented in this report are preliminary and upon coder review may  be revised to meet current compliance requirements. Hulen Luster, MD 04/12/2015 8:22:42 AM This report has been signed electronically. Number of Addenda: 0 Note Initiated On: 04/12/2015 7:57 AM Total Procedure Duration: 0 hours 16 minutes 32 seconds  [facility]

## 2015-04-12 NOTE — H&P (Signed)
Primary Care Physician:  Chiquita Loth, MD Primary Gastroenterologist:  Dr. Candace Cruise  Pre-Procedure History & Physical: HPI:  Madeline Fox is a 70 y.o. female is here for an colonoscopy.   Past Medical History  Diagnosis Date  . Hypertension   . Hyperlipidemia   . Heart murmur   . Arthritis   . Neuromuscular disorder ([place])     NEUROPATHY IN TOES  . Nocturia   . Hyperthyroidism   . Cancer ([place]) 2007    L BREAST CANCER - CHEMO/RADIATION /LUMPECTOMY    . Breast cancer ([place]) 2007    LT LUMPECTOMY  . S/P chemotherapy, time since greater than 12 weeks 2007    BREAST CA  . S/P radiation > 12 weeks 2007    BREAST CA  . Anxiety   . Diverticulosis of colon with hemorrhage   . Goiter   . Hypothyroidism     s/p thyroid ablation  . Internal hemorrhoids   . Anemia     iron deficiency anemia  . Pneumonia   . Ovarian carcinoma in situ   . Psoriasis   . Diabetes mellitus without complication (HCC)     Type 2  . Thrombocytopenia [facility])     Past Surgical History  Procedure Laterality Date  . Abdominal hysterectomy    . Cholecystectomy    . Breast lumpectomy    . Thyroidectomy  11/19/2011    Procedure: THYROIDECTOMY;  Surgeon: Earnstine Regal, MD;  Location: [facility] ORS;  Service: General;  Laterality: N/A;  Total Thyroidectomy  . Breast biopsy Left 2007    POS  . Mastectomy partial / lumpectomy Left 2007    chemo and radiation  . Appendectomy    . Oophorectomy      for carcinoma in situ  . Fine needle aspiration Left     left upper lobe thyroid - benign  . Thyroidectomy      Prior to Admission medications   Medication Sig Start Date End Date Taking? Authorizing Provider  benazepril (LOTENSIN) 20 MG tablet Take 10 mg by mouth every morning.    Yes Historical Provider, MD  acetaminophen (TYLENOL) 500 MG tablet Take 500 mg by mouth every 6 (six) hours as needed. For pain    Historical Provider, MD  ALPRAZolam (XANAX) 0.25 MG tablet Take 0.25 mg by mouth at bedtime as needed. Rx  is expired - pt & pt's daughter state that pt has taken one everyday since Saturday.    Historical Provider, MD  amoxicillin-clavulanate (AUGMENTIN) 875-125 MG per tablet  01/29/12   Historical Provider, MD  aspirin 81 MG tablet Take 81 mg by mouth every morning.     Historical Provider, MD  BAYER CONTOUR NEXT TEST test strip  10/28/11   Historical Provider, MD  BAYER MICROLET LANCETS lancets  10/28/11   Historical Provider, MD  calcium carbonate (OS-CAL - DOSED IN MG OF ELEMENTAL CALCIUM) 1250 MG tablet Take 2 tablets (1,000 mg of elemental calcium total) by mouth 2 (two) times daily. 11/20/11   Armandina Gemma, MD  cetirizine (ZYRTEC) 10 MG tablet Take 10 mg by mouth at bedtime.    Historical Provider, MD  ferrous sulfate 325 (65 FE) MG tablet Take 325 mg by mouth 2 (two) times daily.    Historical Provider, MD  fluticasone Asencion Islam) 50 MCG/ACT nasal spray  12/02/11   Historical Provider, MD  glipiZIDE (GLUCOTROL) 10 MG tablet Take 10 mg by mouth 2 (two) times daily before a meal.  08/04/11  Historical Provider, MD  hydrochlorothiazide (HYDRODIURIL) 25 MG tablet Take 25 mg by mouth every morning. Reported on 04/12/2015 08/17/11   Historical Provider, MD  levothyroxine (SYNTHROID) 50 MCG tablet Take 1.5 tablets (75 mcg total) by mouth daily. 02/01/12   Armandina Gemma, MD  lovastatin (MEVACOR) 40 MG tablet Take 40 mg by mouth at bedtime.  08/17/11   Historical Provider, MD  metFORMIN (GLUCOPHAGE) 1000 MG tablet Take 1,000 mg by mouth 2 (two) times daily with a meal.  10/05/11   Historical Provider, MD  sitaGLIPtin (JANUVIA) 100 MG tablet Take 100 mg by mouth daily.    Historical Provider, MD  sodium chloride (OCEAN) 0.65 % nasal spray Place 1 spray into the nose as needed. For nasal congestion    Historical Provider, MD  vitamin B-12 (CYANOCOBALAMIN) 1000 MCG tablet Take 1,000 mcg by mouth daily.    Historical Provider, MD    Allergies as of 04/01/2015  . (No Known Allergies)    Family History  Problem Relation  Age of Onset  . Cancer Mother   . Heart disease Father   . Cancer Sister   . Cancer Brother   . Breast cancer Neg Hx     Social History   Social History  . Marital Status: Married    Spouse Name: N/A  . Number of Children: N/A  . Years of Education: N/A   Occupational History  . Not on file.   Social History Main Topics  . Smoking status: Never Smoker   . Smokeless tobacco: Never Used  . Alcohol Use: No  . Drug Use: No  . Sexual Activity: Not on file   Other Topics Concern  . Not on file   Social History Narrative    Review of Systems: See HPI, otherwise negative ROS  Physical Exam: BP 175/60 mmHg  Pulse 69  Temp(Src) 96.4 F (35.8 C) (Tympanic)  Resp 16  Ht 5\' 5"  (1.651 m)  Wt 84.823 kg (187 lb)  BMI 31.12 kg/m2  SpO2 100% General:   Alert,  pleasant and cooperative in NAD Head:  Normocephalic and atraumatic. Neck:  Supple; no masses or thyromegaly. Lungs:  Clear throughout to auscultation.    Heart:  Regular rate and rhythm. Abdomen:  Soft, nontender and nondistended. Normal bowel sounds, without guarding, and without rebound.   Neurologic:  Alert and  oriented x4;  grossly normal neurologically.  Impression/Plan: Madeline Fox is here for an colonoscopy to be performed for screening.  Risks, benefits, limitations, and alternatives regarding colonoscopy have been reviewed with the patient.  Questions have been answered.  All parties agreeable.   Madeline Fox, Lupita Dawn, MD  04/12/2015, 7:49 AM

## 2015-04-12 NOTE — Anesthesia Preprocedure Evaluation (Addendum)
Anesthesia Evaluation  Patient identified by MRN, date of birth, ID band Patient awake    Reviewed: Allergy & Precautions, H&P , NPO status , Patient's Chart, lab work & pertinent test results  History of Anesthesia Complications Negative for: history of anesthetic complications  Airway Mallampati: III  TM Distance: >3 FB Neck ROM: limited    Dental  (+) Poor Dentition, Missing   Pulmonary neg shortness of breath, pneumonia, resolved,    Pulmonary exam normal breath sounds clear to auscultation       Cardiovascular Exercise Tolerance: Good hypertension, (-) angina(-) DOE Normal cardiovascular exam+ Valvular Problems/Murmurs AI  Rhythm:regular Rate:Normal     Neuro/Psych  Neuromuscular disease negative psych ROS   GI/Hepatic negative GI ROS, Neg liver ROS,   Endo/Other  diabetes, Type 2, Oral Hypoglycemic AgentsHypothyroidism Hyperthyroidism   Renal/GU negative Renal ROS  negative genitourinary   Musculoskeletal  (+) Arthritis ,   Abdominal   Peds  Hematology negative hematology ROS (+)   Anesthesia Other Findings Past Medical History:   Hypertension                                                 Hyperlipidemia                                               Heart murmur                                                 Arthritis                                                    Neuromuscular disorder ([place])                                   Comment:NEUROPATHY IN TOES   Nocturia                                                     Hyperthyroidism                                              Cancer ([place])                                    2007           Comment:L BREAST CANCER - CHEMO/RADIATION /LUMPECTOMY     Breast cancer ([place])  2007           Comment:LT LUMPECTOMY   S/P chemotherapy, time since greater than 12 w* 2007           Comment:BREAST CA   S/P radiation > 12 weeks                         2007           Comment:BREAST CA   Anxiety                                                      Diverticulosis of colon with hemorrhage                      Goiter                                                       Hypothyroidism                                                 Comment:s/p thyroid ablation   Internal hemorrhoids                                         Anemia                                                         Comment:iron deficiency anemia   Pneumonia                                                    Ovarian carcinoma in situ                                    Psoriasis                                                    Diabetes mellitus without complication (HCC)                   Comment:Type 2   Thrombocytopenia ([place])                                      Past Surgical History:   ABDOMINAL HYSTERECTOMY  CHOLECYSTECTOMY                                               BREAST LUMPECTOMY                                             THYROIDECTOMY                                    11/19/2011     Comment:Procedure: THYROIDECTOMY;  Surgeon: Earnstine Regal, MD;  Location: [facility]S;  Service:               General;  Laterality: N/A;  Total Thyroidectomy   BREAST BIOPSY                                   Left 2007           Comment:POS   MASTECTOMY PARTIAL / LUMPECTOMY                 Left 2007           Comment:chemo and radiation   APPENDECTOMY                                                  OOPHORECTOMY                                                    Comment:for carcinoma in situ   FINE NEEDLE ASPIRATION                          Left                Comment:left upper lobe thyroid - benign   THYROIDECTOMY                                                BMI    Body Mass Index   31.11 kg/m 2      Reproductive/Obstetrics negative OB ROS                            Anesthesia  Physical Anesthesia Plan  ASA: III  Anesthesia Plan: General   Post-op Pain Management:    Induction:   Airway Management Planned:   Additional Equipment:   Intra-op Plan:   Post-operative Plan:   Informed Consent: I have reviewed the patients History and Physical, chart, labs and discussed the procedure including the risks, benefits and alternatives for the proposed anesthesia with the patient or authorized  representative who has indicated his/her understanding and acceptance.   Dental Advisory Given  Plan Discussed with: Anesthesiologist, CRNA and Surgeon  Anesthesia Plan Comments:         Anesthesia Quick Evaluation

## 2015-04-12 NOTE — Transfer of Care (Signed)
Immediate Anesthesia Transfer of Care Note  Patient: Madeline Fox  Procedure(s) Performed: Procedure(s): COLONOSCOPY WITH PROPOFOL (N/A)  Patient Location: PACU  Anesthesia Type:General  Level of Consciousness: awake, alert  and oriented  Airway & Oxygen Therapy: Patient Spontanous Breathing  Post-op Assessment: Report given to RN and Post -op Vital signs reviewed and stable  Post vital signs: Reviewed and stable  Last Vitals:  Filed Vitals:   04/12/15 0725  BP: 175/60  Pulse: 69  Temp: 35.8 C  Resp: 16    Complications: No apparent anesthesia complications

## 2015-04-17 ENCOUNTER — Encounter: Payer: Self-pay | Admitting: Gastroenterology

## 2016-01-01 ENCOUNTER — Other Ambulatory Visit: Payer: Self-pay | Admitting: Physician Assistant

## 2016-01-01 DIAGNOSIS — Z1231 Encounter for screening mammogram for malignant neoplasm of breast: Secondary | ICD-10-CM

## 2016-02-13 ENCOUNTER — Ambulatory Visit: Payer: Medicare Other

## 2016-03-12 ENCOUNTER — Ambulatory Visit
Admission: RE | Admit: 2016-03-12 | Discharge: 2016-03-12 | Disposition: A | Discharge status: Home / Self Care | Payer: Medicare Other | Source: Ambulatory Visit | Attending: Physician Assistant | Admitting: Physician Assistant

## 2016-03-12 DIAGNOSIS — Z1231 Encounter for screening mammogram for malignant neoplasm of breast: Secondary | ICD-10-CM | POA: Diagnosis present

## 2016-04-29 LAB — LIPID PANEL
Cholesterol: 170 (ref 0–200)
HDL: 33 — AB (ref 35–70)
LDL CALC: 97
Triglycerides: 297 — AB (ref 40–160)

## 2016-04-29 LAB — BASIC METABOLIC PANEL
BUN: 21 (ref 4–21)
Creatinine: 1.3 — AB (ref <=1.1)

## 2016-04-29 LAB — HEMOGLOBIN A1C: Hemoglobin A1C: 7.1

## 2016-11-05 ENCOUNTER — Ambulatory Visit: Payer: Self-pay | Admitting: "Endocrinology

## 2016-12-08 ENCOUNTER — Ambulatory Visit: Payer: Medicare Other | Admitting: "Endocrinology

## 2016-12-08 ENCOUNTER — Encounter: Payer: Self-pay | Admitting: "Endocrinology

## 2016-12-08 VITALS — BP 166/70 | HR 62 | Ht 63.0 in | Wt 185.0 lb

## 2016-12-08 DIAGNOSIS — E782 Mixed hyperlipidemia: Secondary | ICD-10-CM

## 2016-12-08 DIAGNOSIS — I10 Essential (primary) hypertension: Secondary | ICD-10-CM | POA: Diagnosis not present

## 2016-12-08 DIAGNOSIS — N183 Chronic kidney disease, stage 3 unspecified: Secondary | ICD-10-CM

## 2016-12-08 DIAGNOSIS — E890 Postprocedural hypothyroidism: Secondary | ICD-10-CM

## 2016-12-08 DIAGNOSIS — E1122 Type 2 diabetes mellitus with diabetic chronic kidney disease: Secondary | ICD-10-CM | POA: Diagnosis not present

## 2016-12-08 NOTE — Progress Notes (Signed)
Consult Note       12/08/2016, 4:32 PM   Subjective:    Patient ID: Madeline Fox, female    DOB: Mar 03, 1945.  Danaija Eskridge is being seen in consultation for management of currently uncontrolled symptomatic diabetes requested by  Chiquita Loth, MD.   Past Medical History:  Diagnosis Date  . Anemia    iron deficiency anemia  . Anxiety   . Arthritis   . Breast cancer ([place]) 2007   LT LUMPECTOMY  . Cancer ([place]) 2007   L BREAST CANCER - CHEMO/RADIATION /LUMPECTOMY    . Diabetes mellitus without complication (HCC)    Type 2  . Diverticulosis of colon with hemorrhage   . Goiter   . Heart murmur   . Hyperlipidemia   . Hypertension   . Hyperthyroidism   . Hypothyroidism    s/p thyroid ablation  . Internal hemorrhoids   . Neuromuscular disorder ([place])    NEUROPATHY IN TOES  . Nocturia   . Ovarian carcinoma in situ   . Pneumonia   . Psoriasis   . S/P chemotherapy, time since greater than 12 weeks 2007   BREAST CA  . S/P radiation > 12 weeks 2007   BREAST CA  . Thrombocytopenia ([place])    Past Surgical History:  Procedure Laterality Date  . ABDOMINAL HYSTERECTOMY    . APPENDECTOMY    . BREAST BIOPSY Left 2007   POS  . BREAST LUMPECTOMY    . CHOLECYSTECTOMY    . FINE NEEDLE ASPIRATION Left    left upper lobe thyroid - benign  . MASTECTOMY PARTIAL / LUMPECTOMY Left 2007   chemo and radiation  . OOPHORECTOMY     for carcinoma in situ  . THYROIDECTOMY     Social History   Socioeconomic History  . Marital status: Married    Spouse name: None  . Number of children: None  . Years of education: None  . Highest education level: None  Social Needs  . Financial resource strain: None  . Food insecurity - worry: None  . Food insecurity - inability: None  . Transportation needs - medical: None  . Transportation needs - non-medical: None  Occupational History  . None  Tobacco Use  .  Smoking status: Never Smoker  . Smokeless tobacco: Never Used  Substance and Sexual Activity  . Alcohol use: No  . Drug use: No  . Sexual activity: None  Other Topics Concern  . None  Social History Narrative  . None   Outpatient Encounter Medications as of 12/08/2016  Medication Sig  . ALPRAZolam (XANAX) 0.25 MG tablet Take 0.25 mg by mouth at bedtime as needed. Rx is expired - pt & pt's daughter state that pt has taken one everyday since Saturday.  Marland Kitchen aspirin 81 MG tablet Take 81 mg by mouth every morning.   . cetirizine (ZYRTEC) 10 MG tablet Take 10 mg by mouth at bedtime.  . Cholecalciferol (VITAMIN D3) 1000 units CAPS Take daily by mouth.  . ferrous sulfate 325 (65 FE) MG tablet Take 325 mg by mouth 2 (two) times daily.  Marland Kitchen levothyroxine (SYNTHROID, LEVOTHROID) 88  MCG tablet Take 88 mcg daily before breakfast by mouth. 1 tab Sun-Fri   1 1/2 tab on Sun  . lovastatin (MEVACOR) 40 MG tablet Take 40 mg by mouth at bedtime.   . Omega-3 Fatty Acids (FISH OIL) 1000 MG CAPS Take daily by mouth.  Marland Kitchen omeprazole (PRILOSEC) 40 MG capsule Take 40 mg daily by mouth.  . pioglitazone (ACTOS) 15 MG tablet Take 15 mg daily by mouth.  . sitaGLIPtin (JANUVIA) 100 MG tablet Take 100 mg by mouth daily.  . vitamin B-12 (CYANOCOBALAMIN) 1000 MCG tablet Take 1,000 mcg by mouth daily.  . [DISCONTINUED] glipiZIDE (GLUCOTROL) 10 MG tablet Take 10 mg by mouth 2 (two) times daily before a meal.   . [DISCONTINUED] acetaminophen (TYLENOL) 500 MG tablet Take 500 mg by mouth every 6 (six) hours as needed. For pain  . [DISCONTINUED] amoxicillin-clavulanate (AUGMENTIN) 875-125 MG per tablet   . [DISCONTINUED] BAYER CONTOUR NEXT TEST test strip   . [DISCONTINUED] BAYER MICROLET LANCETS lancets   . [DISCONTINUED] benazepril (LOTENSIN) 20 MG tablet Take 10 mg by mouth every morning.   . [DISCONTINUED] calcium carbonate (OS-CAL - DOSED IN MG OF ELEMENTAL CALCIUM) 1250 MG tablet Take 2 tablets (1,000 mg of elemental calcium  total) by mouth 2 (two) times daily.  . [DISCONTINUED] fluticasone (FLONASE) 50 MCG/ACT nasal spray   . [DISCONTINUED] hydrochlorothiazide (HYDRODIURIL) 25 MG tablet Take 25 mg by mouth every morning. Reported on 04/12/2015  . [DISCONTINUED] levothyroxine (SYNTHROID) 50 MCG tablet Take 1.5 tablets (75 mcg total) by mouth daily.  . [DISCONTINUED] metFORMIN (GLUCOPHAGE) 1000 MG tablet Take 1,000 mg by mouth 2 (two) times daily with a meal.   . [DISCONTINUED] sodium chloride (OCEAN) 0.65 % nasal spray Place 1 spray into the nose as needed. For nasal congestion   No facility-administered encounter medications on file as of 12/08/2016.     ALLERGIES: No Known Allergies  VACCINATION STATUS: There is no immunization history for the selected administration types on file for this patient.  Diabetes  She presents for her initial diabetic visit. She has type 2 diabetes mellitus. Onset time: She was diagnosed at approximate age of 25 years. Denies any history of gestational diabetes. Her disease course has been stable. There are no hypoglycemic associated symptoms. Pertinent negatives for hypoglycemia include no confusion, headaches, pallor or seizures. There are no diabetic associated symptoms. Pertinent negatives for diabetes include no chest pain, no polydipsia, no polyphagia and no polyuria. There are no hypoglycemic complications. Symptoms are stable. Diabetic complications include nephropathy. Risk factors for coronary artery disease include diabetes mellitus, dyslipidemia, family history, hypertension, obesity, sedentary lifestyle and post-menopausal. Her weight is increasing steadily. She is following a generally unhealthy diet. When asked about meal planning, she reported none. She has not had a previous visit with a dietitian. She never participates in exercise. (Patient came with no meter nor logs to review. Her A1c from April 2018 was 7.1%.) An ACE inhibitor/angiotensin II receptor blocker is not  being taken. She does not see a podiatrist.Eye exam is current.  Hyperlipidemia  This is a chronic problem. The current episode started more than 1 year ago. Exacerbating diseases include chronic renal disease, diabetes, hypothyroidism and obesity. Pertinent negatives include no chest pain, myalgias or shortness of breath. Current antihyperlipidemic treatment includes statins. Risk factors for coronary artery disease include diabetes mellitus, dyslipidemia, family history, hypertension, obesity, a sedentary lifestyle and post-menopausal.  Hypertension  This is a chronic problem. The current episode started more than 1 year  ago. The problem is uncontrolled. Pertinent negatives include no chest pain, headaches, palpitations or shortness of breath. Risk factors for coronary artery disease include diabetes mellitus, dyslipidemia, obesity and sedentary lifestyle. Identifiable causes of hypertension include chronic renal disease.      Review of Systems  Constitutional: Positive for unexpected weight change. Negative for chills and fever.  HENT: Negative for trouble swallowing and voice change.   Eyes: Negative for visual disturbance.  Respiratory: Negative for cough, shortness of breath and wheezing.   Cardiovascular: Negative for chest pain, palpitations and leg swelling.  Gastrointestinal: Negative for diarrhea, nausea and vomiting.  Endocrine: Negative for cold intolerance, heat intolerance, polydipsia, polyphagia and polyuria.  Musculoskeletal: Negative for arthralgias and myalgias.  Skin: Negative for color change, pallor, rash and wound.  Neurological: Negative for seizures and headaches.  Psychiatric/Behavioral: Negative for confusion and suicidal ideas.    Objective:    BP (!) 166/70   Pulse 62   Ht 5\' 3"  (1.6 m)   Wt 185 lb (83.9 kg)   BMI 32.77 kg/m   Wt Readings from Last 3 Encounters:  12/08/16 185 lb (83.9 kg)  04/12/15 187 lb (84.8 kg)  02/01/12 165 lb 3.2 oz (74.9 kg)      Physical Exam  Constitutional: She is oriented to person, place, and time. She appears well-developed.  HENT:  Head: Normocephalic and atraumatic.  Eyes: EOM are normal.  Neck: Normal range of motion. Neck supple. No tracheal deviation present. No thyromegaly present.  Cardiovascular: Normal rate and regular rhythm.  Pulmonary/Chest: Effort normal and breath sounds normal.  Abdominal: Soft. Bowel sounds are normal. There is no tenderness. There is no guarding.  Musculoskeletal: Normal range of motion. She exhibits no edema.  Neurological: She is alert and oriented to person, place, and time. She has normal reflexes. No cranial nerve deficit. Coordination normal.  Skin: Skin is warm and dry. No rash noted. No erythema. No pallor.  Psychiatric: She has a normal mood and affect. Judgment normal.    CMP ( most recent) CMP     Component Value Date/Time   NA 132 (L) 11/20/2011 0442   K 4.7 11/20/2011 0442   CL 99 11/20/2011 0442   CO2 26 11/20/2011 0442   GLUCOSE 154 (H) 11/20/2011 0442   BUN 14 11/20/2011 0442   CREATININE 1.53 (H) 02/03/2013 1007   CALCIUM 9.0 11/20/2011 0442   PROT 7.6 02/03/2013 1007   ALBUMIN 3.9 02/03/2013 1007   AST 17 02/03/2013 1007   ALT 17 02/03/2013 1007   ALKPHOS 83 02/03/2013 1007   BILITOT 0.3 02/03/2013 1007   GFRNONAA 34 (L) 02/03/2013 1007   GFRAA 40 (L) 02/03/2013 1007   A1c was 7.1% from 04/29/2016.   Assessment & Plan:   1. Type 2 diabetes mellitus with stage 3 chronic kidney disease, without long-term current use of insulin (HCC)  - Hansini Clodfelter has currently uncontrolled symptomatic type 2 DM since  71 years of age. - She does not have recent labs to review, A1c from April 2018 was 7.1%. She will be sent for new set of labs today including thyroid function test, renal function, and A1c.  -her diabetes is complicated by stage 3 renal insufficiency, obesity/sedentary life and Sharlize Hoar remains at a high risk for more acute and  chronic complications which include CAD, CVA, CKD, retinopathy, and neuropathy. These are all discussed in detail with the patient.  - I have counseled her on diet management and weight loss, by adopting a  carbohydrate restricted/protein rich diet.  - Suggestion is made for her to avoid simple carbohydrates  from her diet including Cakes, Sweet Desserts, Ice Cream, Soda (diet and regular), Sweet Tea, Candies, Chips, Cookies, Store Bought Juices, Alcohol in Excess of  1-2 drinks a day, Artificial Sweeteners, and "Sugar-free" Products. This will help patient to have stable blood glucose profile and potentially avoid unintended weight gain.  - I encouraged her to switch to  unprocessed or minimally processed complex starch and increased protein intake (animal or plant source), fruits, and vegetables.  - she is advised to stick to a routine mealtimes to eat 3 meals  a day and avoid unnecessary snacks ( to snack only to correct hypoglycemia).   - she will be scheduled with Jearld Fenton, RDN, CDE for individualized diabetes education.  - I have approached her with the following individualized plan to manage diabetes and patient agrees:   -  I  approach her to initiate initiate strict monitoring of glucose 4 times a day-before meals and at bedtime, and return in one week with her meter and logs for reevaluation. - If her glycemic profile is significantly above target and A1c is higher than 9%, she'll be considered for insulin treatment.  - Patient is warned not to take insulin without proper monitoring per orders.  -Patient is encouraged to call clinic for blood glucose levels less than 70 or above 300 mg /dl. - I will contiJanuvia 100 mg by mouth daily, therapeutically suitable for patient . - I will discontinue glipizide risk outweighs benefit for this patient. - I advised her to continue with Actos 15 mg by mouth daily for now. This medication will be stopped if she is initiated on insulin  treatment.   - she will be considered for incretin therapy as appropriate next visit. - Patient specific target  A1c;  LDL, HDL, Triglycerides, and  Waist Circumference were discussed in detail.  2) BP/HTN: uncontrolled, she insists that her blood pressure is always controlled home, and has problem with doctor's office which raises her blood pressure. Continue current medications . 3) Lipids/HPL: uncontrolled, LDL was 97 in April 2018.   Patient is advised to continue statins. 4)  Weight/Diet: CDE Consult will be initiated , exercise, and detailed carbohydrates information provided.  5) Postsurgical hypothyroidism - She underwent total thyroidectomy for large multinodular goiter in 2013. She has been on levothyroxine 88 g by mouth every morning. She is compliant. - I would include thyroid function test with her labs today. - In the meantime, I advised her to remain on levothyroxine 88 mg by mouth every morning.  - We discussed about correct intake of levothyroxine, at fasting, with water, separated by at least 30 minutes from breakfast, and separated by more than 4 hours from calcium, iron, multivitamins, acid reflux medications (PPIs). -Patient is made aware of the fact that thyroid hormone replacement is needed for life, dose to be adjusted by periodic monitoring of thyroid function tests. - She may need surveillance thyroid/neck ultrasound on subsequent visits.  6) Chronic Care/Health Maintenance:  -she  is on Statin medications and  is encouraged to continue to follow up with Ophthalmology, Dentist,  Podiatrist at least yearly or according to recommendations, and advised to   stay away from smoking. I have recommended yearly flu vaccine and pneumonia vaccination at least every 5 years; moderate intensity exercise for up to 150 minutes weekly; and  sleep for at least 7 hours a day.  - I advised patient to  maintain close follow up with Nash Mantis Delano Metz, MD for primary care needs.  - Time  spent with the patient: 1 hour, of which >50% was spent in obtaining information about her symptoms, reviewing her previous labs, evaluations, and treatments, counseling her about her complicated type 2 diabetes, postsurgical hypothyroidism, hyperlipidemia, and developing a plan for long term treatment; her  questions were answered to her satisfaction.  Follow up plan: - Return in about 1 week (around 12/15/2016) for meter, and logs, labs today.  Glade Lloyd, MD [facility] [address] [place],  [address] Phone: [phone]  Fax: [phone]    12/08/2016, 4:32 PM  This note was partially dictated with voice recognition software. Similar sounding words can be transcribed inadequately or may not  be corrected upon review.

## 2016-12-08 NOTE — Patient Instructions (Signed)

## 2016-12-09 ENCOUNTER — Other Ambulatory Visit: Payer: Self-pay | Admitting: "Endocrinology

## 2016-12-10 LAB — T4, FREE: FREE T4: 1.3 ng/dL (ref 0.8–1.8)

## 2016-12-10 LAB — VITAMIN D 25 HYDROXY (VIT D DEFICIENCY, FRACTURES): Vit D, 25-Hydroxy: 31 ng/mL (ref 30–100)

## 2016-12-10 LAB — COMPLETE METABOLIC PANEL WITH GFR
AG RATIO: 1.4 (calc) (ref 1.0–2.5)
ALKALINE PHOSPHATASE (APISO): 76 U/L (ref 33–130)
ALT: 11 U/L (ref 6–29)
AST: 17 U/L (ref 10–35)
Albumin: 4.3 g/dL (ref 3.6–5.1)
BUN/Creatinine Ratio: 13 (calc) (ref 6–22)
BUN: 18 mg/dL (ref 7–25)
CALCIUM: 9.4 mg/dL (ref 8.6–10.4)
CO2: 27 mmol/L (ref 20–32)
CREATININE: 1.38 mg/dL — AB (ref 0.60–0.93)
Chloride: 101 mmol/L (ref 98–110)
GFR, EST NON AFRICAN AMERICAN: 38 mL/min/{1.73_m2} — AB (ref >=60)
GFR, Est African American: 44 mL/min/{1.73_m2} — ABNORMAL LOW (ref >=60)
Globulin: 3.1 g/dL (calc) (ref 1.9–3.7)
Glucose, Bld: 200 mg/dL — ABNORMAL HIGH (ref 65–99)
POTASSIUM: 4.7 mmol/L (ref 3.5–5.3)
Sodium: 138 mmol/L (ref 135–146)
Total Bilirubin: 0.5 mg/dL (ref 0.2–1.2)
Total Protein: 7.4 g/dL (ref 6.1–8.1)

## 2016-12-10 LAB — MICROALBUMIN / CREATININE URINE RATIO
CREATININE, URINE: 73 mg/dL (ref 20–275)
MICROALB UR: 1 mg/dL
MICROALB/CREAT RATIO: 14 ug/mg{creat} (ref <30)

## 2016-12-10 LAB — HEMOGLOBIN A1C
EAG (MMOL/L): 9 (calc)
Hgb A1c MFr Bld: 7.3 % of total Hgb — ABNORMAL HIGH (ref <5.7)
MEAN PLASMA GLUCOSE: 163 (calc)

## 2016-12-10 LAB — TSH: TSH: 2.65 mIU/L (ref 0.40–4.50)

## 2016-12-15 ENCOUNTER — Encounter: Payer: Self-pay | Admitting: "Endocrinology

## 2016-12-15 ENCOUNTER — Ambulatory Visit (INDEPENDENT_AMBULATORY_CARE_PROVIDER_SITE_OTHER): Payer: Medicare Other | Admitting: "Endocrinology

## 2016-12-15 VITALS — BP 165/82 | HR 65 | Ht 63.0 in | Wt 182.0 lb

## 2016-12-15 DIAGNOSIS — E890 Postprocedural hypothyroidism: Secondary | ICD-10-CM

## 2016-12-15 DIAGNOSIS — N183 Chronic kidney disease, stage 3 (moderate): Secondary | ICD-10-CM

## 2016-12-15 DIAGNOSIS — E1122 Type 2 diabetes mellitus with diabetic chronic kidney disease: Secondary | ICD-10-CM | POA: Diagnosis not present

## 2016-12-15 DIAGNOSIS — I10 Essential (primary) hypertension: Secondary | ICD-10-CM

## 2016-12-15 DIAGNOSIS — E782 Mixed hyperlipidemia: Secondary | ICD-10-CM

## 2016-12-15 NOTE — Progress Notes (Signed)
Consult Note       12/15/2016, 4:44 PM   Subjective:    Patient ID: Madeline Fox, female    DOB: 14-Jul-1945.  Madeline Fox is being seen in consultation for management of currently uncontrolled symptomatic diabetes requested by  Chiquita Loth, MD.   Past Medical History:  Diagnosis Date  . Anemia    iron deficiency anemia  . Anxiety   . Arthritis   . Breast cancer ([place]) 2007   LT LUMPECTOMY  . Cancer ([place]) 2007   L BREAST CANCER - CHEMO/RADIATION /LUMPECTOMY    . Diabetes mellitus without complication (HCC)    Type 2  . Diverticulosis of colon with hemorrhage   . Goiter   . Heart murmur   . Hyperlipidemia   . Hypertension   . Hyperthyroidism   . Hypothyroidism    s/p thyroid ablation  . Internal hemorrhoids   . Neuromuscular disorder ([place])    NEUROPATHY IN TOES  . Nocturia   . Ovarian carcinoma in situ   . Pneumonia   . Psoriasis   . S/P chemotherapy, time since greater than 12 weeks 2007   BREAST CA  . S/P radiation > 12 weeks 2007   BREAST CA  . Thrombocytopenia ([place])    Past Surgical History:  Procedure Laterality Date  . ABDOMINAL HYSTERECTOMY    . APPENDECTOMY    . BREAST BIOPSY Left 2007   POS  . BREAST LUMPECTOMY    . CHOLECYSTECTOMY    . COLONOSCOPY WITH PROPOFOL N/A 04/12/2015   Procedure: COLONOSCOPY WITH PROPOFOL;  Surgeon: Hulen Luster, MD;  Location: [facility] ENDOSCOPY;  Service: Gastroenterology;  Laterality: N/A;  . FINE NEEDLE ASPIRATION Left    left upper lobe thyroid - benign  . MASTECTOMY PARTIAL / LUMPECTOMY Left 2007   chemo and radiation  . OOPHORECTOMY     for carcinoma in situ  . THYROIDECTOMY  11/19/2011   Procedure: THYROIDECTOMY;  Surgeon: Earnstine Regal, MD;  Location: [facility] ORS;  Service: General;  Laterality: N/A;  Total Thyroidectomy  . THYROIDECTOMY     Social History   Socioeconomic History  . Marital status: Married    Spouse name: None  .  Number of children: None  . Years of education: None  . Highest education level: None  Social Needs  . Financial resource strain: None  . Food insecurity - worry: None  . Food insecurity - inability: None  . Transportation needs - medical: None  . Transportation needs - non-medical: None  Occupational History  . None  Tobacco Use  . Smoking status: Never Smoker  . Smokeless tobacco: Never Used  Substance and Sexual Activity  . Alcohol use: No  . Drug use: No  . Sexual activity: None  Other Topics Concern  . None  Social History Narrative  . None   Outpatient Encounter Medications as of 12/15/2016  Medication Sig  . ALPRAZolam (XANAX) 0.25 MG tablet Take 0.25 mg by mouth at bedtime as needed. Rx is expired - pt & pt's daughter state that pt has taken one everyday since Saturday.  Marland Kitchen aspirin 81 MG tablet Take  81 mg by mouth every morning.   . cetirizine (ZYRTEC) 10 MG tablet Take 10 mg by mouth at bedtime.  . Cholecalciferol (VITAMIN D3) 1000 units CAPS Take daily by mouth.  . ferrous sulfate 325 (65 FE) MG tablet Take 325 mg by mouth 2 (two) times daily.  Marland Kitchen levothyroxine (SYNTHROID, LEVOTHROID) 88 MCG tablet Take 88 mcg daily before breakfast by mouth. 1 tab Sun-Fri   1 1/2 tab on Sun  . lovastatin (MEVACOR) 40 MG tablet Take 40 mg by mouth at bedtime.   . Omega-3 Fatty Acids (FISH OIL) 1000 MG CAPS Take daily by mouth.  Marland Kitchen omeprazole (PRILOSEC) 40 MG capsule Take 40 mg daily by mouth.  . pioglitazone (ACTOS) 15 MG tablet Take 15 mg daily by mouth.  . sitaGLIPtin (JANUVIA) 100 MG tablet Take 100 mg by mouth daily.  . vitamin B-12 (CYANOCOBALAMIN) 1000 MCG tablet Take 1,000 mcg by mouth daily.   No facility-administered encounter medications on file as of 12/15/2016.     ALLERGIES: No Known Allergies  VACCINATION STATUS: There is no immunization history for the selected administration types on file for this patient.  Diabetes  She presents for her follow-up diabetic visit.  She has type 2 diabetes mellitus. Onset time: She was diagnosed at approximate age of 11 years. Denies any history of gestational diabetes. Her disease course has been improving. There are no hypoglycemic associated symptoms. Pertinent negatives for hypoglycemia include no confusion, headaches, pallor or seizures. There are no diabetic associated symptoms. Pertinent negatives for diabetes include no chest pain, no polydipsia, no polyphagia and no polyuria. There are no hypoglycemic complications. Symptoms are improving. Diabetic complications include nephropathy. Risk factors for coronary artery disease include diabetes mellitus, dyslipidemia, family history, hypertension, obesity, sedentary lifestyle and post-menopausal. Her weight is stable. She is following a generally unhealthy diet. When asked about meal planning, she reported none. She has not had a previous visit with a dietitian. She never participates in exercise. Her breakfast blood glucose range is generally 140-180 mg/dl. Her lunch blood glucose range is generally 140-180 mg/dl. Her dinner blood glucose range is generally 140-180 mg/dl. Her bedtime blood glucose range is generally 140-180 mg/dl. Her overall blood glucose range is 140-180 mg/dl. (Patient came with no meter nor logs to review. Her A1c from April 2018 was 7.1%.) An ACE inhibitor/angiotensin II receptor blocker is not being taken. She does not see a podiatrist.Eye exam is current.  Hyperlipidemia  This is a chronic problem. The current episode started more than 1 year ago. Exacerbating diseases include chronic renal disease, diabetes, hypothyroidism and obesity. Pertinent negatives include no chest pain, myalgias or shortness of breath. Current antihyperlipidemic treatment includes statins. Risk factors for coronary artery disease include diabetes mellitus, dyslipidemia, family history, hypertension, obesity, a sedentary lifestyle and post-menopausal.  Hypertension  This is a chronic  problem. The current episode started more than 1 year ago. The problem is uncontrolled. Pertinent negatives include no chest pain, headaches, palpitations or shortness of breath. Risk factors for coronary artery disease include diabetes mellitus, dyslipidemia, obesity and sedentary lifestyle. Identifiable causes of hypertension include chronic renal disease.    Review of Systems  Constitutional: Positive for unexpected weight change. Negative for chills and fever.  HENT: Negative for trouble swallowing and voice change.   Eyes: Negative for visual disturbance.  Respiratory: Negative for cough, shortness of breath and wheezing.   Cardiovascular: Negative for chest pain, palpitations and leg swelling.  Gastrointestinal: Negative for diarrhea, nausea and vomiting.  Endocrine:  Negative for cold intolerance, heat intolerance, polydipsia, polyphagia and polyuria.  Musculoskeletal: Negative for arthralgias and myalgias.  Skin: Negative for color change, pallor, rash and wound.  Neurological: Negative for seizures and headaches.  Psychiatric/Behavioral: Negative for confusion and suicidal ideas.    Objective:    BP (!) 165/82   Pulse 65   Ht 5\' 3"  (1.6 m)   Wt 182 lb (82.6 kg)   BMI 32.24 kg/m   Wt Readings from Last 3 Encounters:  12/15/16 182 lb (82.6 kg)  12/08/16 185 lb (83.9 kg)  04/12/15 187 lb (84.8 kg)     Physical Exam  Constitutional: She is oriented to person, place, and time. She appears well-developed.  HENT:  Head: Normocephalic and atraumatic.  Eyes: EOM are normal.  Neck: Normal range of motion. Neck supple. No tracheal deviation present. No thyromegaly present.  Cardiovascular: Normal rate and regular rhythm.  Pulmonary/Chest: Effort normal and breath sounds normal.  Abdominal: Soft. Bowel sounds are normal. There is no tenderness. There is no guarding.  Musculoskeletal: Normal range of motion. She exhibits no edema.  Neurological: She is alert and oriented to  person, place, and time. She has normal reflexes. No cranial nerve deficit. Coordination normal.  Skin: Skin is warm and dry. No rash noted. No erythema. No pallor.  Psychiatric: She has a normal mood and affect. Judgment normal.   Recent Results (from the past 2160 hour(s))  Microalbumin / creatinine urine ratio     Status: None   Collection Time: 12/09/16  8:13 AM  Result Value Ref Range   Creatinine, Urine 73 20 - 275 mg/dL   Microalb, Ur 1.0 mg/dL    Comment: Reference Range Not established    Microalb Creat Ratio 14 <30 mcg/mg creat    Comment: . The ADA defines abnormalities in albumin excretion as follows: Marland Kitchen Category         Result (mcg/mg creatinine) . Normal                    <30 Microalbuminuria         30-299  Clinical albuminuria   > OR = 300 . The ADA recommends that at least two of three specimens collected within a 3-6 month period be abnormal before considering a patient to be within a diagnostic category.   COMPLETE METABOLIC PANEL WITH GFR     Status: Abnormal   Collection Time: 12/09/16  8:13 AM  Result Value Ref Range   Glucose, Bld 200 (H) 65 - 99 mg/dL    Comment: .            Fasting reference interval . For someone without known diabetes, a glucose value >125 mg/dL indicates that they may have diabetes and this should be confirmed with a follow-up test. .    BUN 18 7 - 25 mg/dL   Creat 1.38 (H) 0.60 - 0.93 mg/dL    Comment: For patients >32 years of age, the reference limit for Creatinine is approximately 13% higher for people identified as African-American. .    GFR, Est Non African American 38 (L) > OR = 60 mL/min/1.29m2   GFR, Est African American 44 (L) > OR = 60 mL/min/1.34m2   BUN/Creatinine Ratio 13 6 - 22 (calc)   Sodium 138 135 - 146 mmol/L   Potassium 4.7 3.5 - 5.3 mmol/L   Chloride 101 98 - 110 mmol/L   CO2 27 20 - 32 mmol/L   Calcium 9.4 8.6 - 10.4  mg/dL   Total Protein 7.4 6.1 - 8.1 g/dL   Albumin 4.3 3.6 - 5.1 g/dL    Globulin 3.1 1.9 - 3.7 g/dL (calc)   AG Ratio 1.4 1.0 - 2.5 (calc)   Total Bilirubin 0.5 0.2 - 1.2 mg/dL   Alkaline phosphatase (APISO) 76 33 - 130 U/L   AST 17 10 - 35 U/L   ALT 11 6 - 29 U/L  Hemoglobin A1c     Status: Abnormal   Collection Time: 12/09/16  8:13 AM  Result Value Ref Range   Hgb A1c MFr Bld 7.3 (H) <5.7 % of total Hgb    Comment: For someone without known diabetes, a hemoglobin A1c value of 6.5% or greater indicates that they may have  diabetes and this should be confirmed with a follow-up  test. . For someone with known diabetes, a value <7% indicates  that their diabetes is well controlled and a value  greater than or equal to 7% indicates suboptimal  control. A1c targets should be individualized based on  duration of diabetes, age, comorbid conditions, and  other considerations. . Currently, no consensus exists regarding use of hemoglobin A1c for diagnosis of diabetes for children. .    Mean Plasma Glucose 163 (calc)   eAG (mmol/L) 9.0 (calc)  VITAMIN D 25 Hydroxy (Vit-D Deficiency, Fractures)     Status: None   Collection Time: 12/09/16  8:13 AM  Result Value Ref Range   Vit D, 25-Hydroxy 31 30 - 100 ng/mL    Comment: Vitamin D Status         25-OH Vitamin D: . Deficiency:                    <20 ng/mL Insufficiency:             20 - 29 ng/mL Optimal:                 > or = 30 ng/mL . For 25-OH Vitamin D testing on patients on  D2-supplementation and patients for whom quantitation  of D2 and D3 fractions is required, the QuestAssureD(TM) 25-OH VIT D, (D2,D3), LC/MS/MS is recommended: order  code 737-663-5508 (patients >73yrs). . For more information on this test, go to: http://education.questdiagnostics.com/faq/FAQ163 (This link is being provided for  informational/educational purposes only.)   TSH     Status: None   Collection Time: 12/09/16  8:13 AM  Result Value Ref Range   TSH 2.65 0.40 - 4.50 mIU/L  T4, free     Status: None   Collection Time:  12/09/16  8:13 AM  Result Value Ref Range   Free T4 1.3 0.8 - 1.8 ng/dL   A1c was 7.1% from 04/29/2016.   Assessment & Plan:   1. Type 2 diabetes mellitus with stage 3 chronic kidney disease, without long-term current use of insulin (HCC)  - Madeline Fox has currently uncontrolled symptomatic type 2 DM since  71 years of age. - Her repeat labs show A1c of 7.3%. -her diabetes is complicated by stage 3 renal insufficiency, obesity/sedentary life and Madeline Fox remains at a high risk for more acute and chronic complications which include CAD, CVA, CKD, retinopathy, and neuropathy. These are all discussed in detail with the patient.  - I have counseled her on diet management and weight loss, by adopting a carbohydrate restricted/protein rich diet.  -  Suggestion is made for her to avoid simple carbohydrates  from her diet including Cakes, Sweet Desserts / Pastries, Ice  Cream, Soda (diet and regular), Sweet Tea, Candies, Chips, Cookies, Store Bought Juices, Alcohol in Excess of  1-2 drinks a day, Artificial Sweeteners, and "Sugar-free" Products. This will help patient to have stable blood glucose profile and potentially avoid unintended weight gain.   - I encouraged her to switch to  unprocessed or minimally processed complex starch and increased protein intake (animal or plant source), fruits, and vegetables.  - she is advised to stick to a routine mealtimes to eat 3 meals  a day and avoid unnecessary snacks ( to snack only to correct hypoglycemia).   - she will be scheduled with Madeline Fox, RDN, CDE for individualized diabetes education- consult pending.  - I have approached her with the following individualized plan to manage diabetes and patient agrees:   - Based on presenting blood glucose profile between 140-180,  and her A1c was 7.3%, she will not require initiation of insulin treatment at this time. - She'll continue to monitor blood glucose twice a day-before breakfast  and at bedtime and , she is encouraged to call clinic for blood glucose levels less than 70 or above 200 mg /dl. - I will continue Januvia 100 mg by mouth daily, therapeutically suitable for patient . - I advised her to continue with Actos 15 mg by mouth daily for now. This medication will be stopped if she is initiated on insulin treatment.   - she will be considered for incretin therapy as appropriate next visit. - Patient specific target  A1c;  LDL, HDL, Triglycerides, and  Waist Circumference were discussed in detail.  2) BP/HTN: uncontrolled, she insists that her blood pressure is always controlled at home, and has problem with doctor's office which raises her blood pressure.  I advised her to Continue current medications . 3) Lipids/HPL: uncontrolled, LDL was 97 in April 2018.   Patient is advised to continue statins. 4)  Weight/Diet: CDE Consult has been  initiated , exercise, and detailed carbohydrates information provided.  5) Postsurgical hypothyroidism - She underwent total thyroidectomy for large multinodular goiter in 2013.  - Her thyroid function tests are consistent with appropriate replacement. I advised her to continue levothyroxine 88 Archie Patten by mouth every morning.    - We discussed about correct intake of levothyroxine, at fasting, with water, separated by at least 30 minutes from breakfast, and separated by more than 4 hours from calcium, iron, multivitamins, acid reflux medications (PPIs). -Patient is made aware of the fact that thyroid hormone replacement is needed for life, dose to be adjusted by periodic monitoring of thyroid function tests.  - She may need surveillance thyroid/neck ultrasound on subsequent visits.  6) Chronic Care/Health Maintenance:  -she  is on Statin medications and  is encouraged to continue to follow up with Ophthalmology, Dentist,  Podiatrist at least yearly or according to recommendations, and advised to   stay away from smoking. I have  recommended yearly flu vaccine and pneumonia vaccination at least every 5 years; moderate intensity exercise for up to 150 minutes weekly; and  sleep for at least 7 hours a day.  - I advised patient to maintain close follow up with Nash Mantis Delano Metz, MD for primary care needs. - Time spent with the patient: 25 min, of which >50% was spent in reviewing her sugar logs , discussing her hypo- and hyper-glycemic episodes, reviewing her current and  previous labs and insulin doses and developing a plan to avoid hypo- and hyper-glycemia.    Follow up plan: - Return  in about 3 months (around 03/17/2017) for follow up with pre-visit labs, meter, and logs.  Glade Lloyd, MD [facility] [address] [place], [place] [address] Phone: [phone]  Fax: [phone]    12/15/2016, 4:44 PM  This note was partially dictated with voice recognition software. Similar sounding words can be transcribed inadequately or may not  be corrected upon review.

## 2016-12-15 NOTE — Patient Instructions (Signed)

## 2017-01-27 ENCOUNTER — Other Ambulatory Visit: Payer: Self-pay | Admitting: Physician Assistant

## 2017-01-27 ENCOUNTER — Other Ambulatory Visit: Payer: Self-pay | Admitting: Family Medicine

## 2017-01-27 DIAGNOSIS — Z1231 Encounter for screening mammogram for malignant neoplasm of breast: Secondary | ICD-10-CM

## 2017-03-11 LAB — HEMOGLOBIN A1C
EAG (MMOL/L): 9.7 (calc)
Hgb A1c MFr Bld: 7.7 % of total Hgb — ABNORMAL HIGH (ref <5.7)
Mean Plasma Glucose: 174 (calc)

## 2017-03-11 LAB — COMPLETE METABOLIC PANEL WITH GFR
AG Ratio: 1.3 (calc) (ref 1.0–2.5)
ALBUMIN MSPROF: 4.3 g/dL (ref 3.6–5.1)
ALKALINE PHOSPHATASE (APISO): 66 U/L (ref 33–130)
ALT: 8 U/L (ref 6–29)
AST: 16 U/L (ref 10–35)
BILIRUBIN TOTAL: 0.5 mg/dL (ref 0.2–1.2)
BUN/Creatinine Ratio: 13 (calc) (ref 6–22)
BUN: 17 mg/dL (ref 7–25)
CHLORIDE: 101 mmol/L (ref 98–110)
CO2: 26 mmol/L (ref 20–32)
CREATININE: 1.32 mg/dL — AB (ref 0.60–0.93)
Calcium: 9.3 mg/dL (ref 8.6–10.4)
GFR, Est African American: 46 mL/min/{1.73_m2} — ABNORMAL LOW (ref >=60)
GFR, Est Non African American: 40 mL/min/{1.73_m2} — ABNORMAL LOW (ref >=60)
GLUCOSE: 193 mg/dL — AB (ref 65–99)
Globulin: 3.2 g/dL (calc) (ref 1.9–3.7)
Potassium: 4.9 mmol/L (ref 3.5–5.3)
Sodium: 134 mmol/L — ABNORMAL LOW (ref 135–146)
Total Protein: 7.5 g/dL (ref 6.1–8.1)

## 2017-03-11 LAB — LIPID PANEL
CHOLESTEROL: 136 mg/dL (ref <200)
HDL: 38 mg/dL — AB (ref >50)
LDL Cholesterol (Calc): 74 mg/dL (calc)
Non-HDL Cholesterol (Calc): 98 mg/dL (calc) (ref <130)
TRIGLYCERIDES: 166 mg/dL — AB (ref <150)
Total CHOL/HDL Ratio: 3.6 (calc) (ref <5.0)

## 2017-03-15 ENCOUNTER — Ambulatory Visit
Admission: RE | Admit: 2017-03-15 | Discharge: 2017-03-15 | Disposition: A | Discharge status: Home / Self Care | Payer: Medicare HMO | Source: Ambulatory Visit | Attending: Family Medicine | Admitting: Family Medicine

## 2017-03-15 DIAGNOSIS — Z1231 Encounter for screening mammogram for malignant neoplasm of breast: Secondary | ICD-10-CM

## 2017-03-17 ENCOUNTER — Encounter: Payer: Self-pay | Admitting: "Endocrinology

## 2017-03-17 ENCOUNTER — Ambulatory Visit: Payer: Medicare HMO | Admitting: "Endocrinology

## 2017-03-17 ENCOUNTER — Encounter: Payer: Medicare HMO | Attending: "Endocrinology | Admitting: Nutrition

## 2017-03-17 VITALS — Ht 64.0 in | Wt 168.0 lb

## 2017-03-17 VITALS — BP 154/64 | HR 66 | Ht 63.0 in | Wt 168.0 lb

## 2017-03-17 DIAGNOSIS — E782 Mixed hyperlipidemia: Secondary | ICD-10-CM | POA: Diagnosis not present

## 2017-03-17 DIAGNOSIS — N183 Chronic kidney disease, stage 3 unspecified: Secondary | ICD-10-CM

## 2017-03-17 DIAGNOSIS — E1122 Type 2 diabetes mellitus with diabetic chronic kidney disease: Secondary | ICD-10-CM | POA: Diagnosis not present

## 2017-03-17 DIAGNOSIS — E1165 Type 2 diabetes mellitus with hyperglycemia: Secondary | ICD-10-CM

## 2017-03-17 DIAGNOSIS — E890 Postprocedural hypothyroidism: Secondary | ICD-10-CM | POA: Diagnosis not present

## 2017-03-17 DIAGNOSIS — Z713 Dietary counseling and surveillance: Secondary | ICD-10-CM | POA: Insufficient documentation

## 2017-03-17 DIAGNOSIS — I10 Essential (primary) hypertension: Secondary | ICD-10-CM | POA: Diagnosis not present

## 2017-03-17 DIAGNOSIS — E118 Type 2 diabetes mellitus with unspecified complications: Secondary | ICD-10-CM

## 2017-03-17 DIAGNOSIS — IMO0002 Reserved for concepts with insufficient information to code with codable children: Secondary | ICD-10-CM

## 2017-03-17 NOTE — Patient Instructions (Signed)
Goals 1.Follow My Plate 2. Increase fresh fruit and vegetables. 3. Keep water 4. Eat meals on time 5. Eat 2-3 carb choices per meal Test blood sugars once a day in am or evening.

## 2017-03-17 NOTE — Progress Notes (Signed)
  Medical Nutrition Therapy:  Appt start time: 1430 end time:  [id number].   Assessment:  Primary concerns today: Diabetes Type 2. Saw DR. Nida, Endocrinology today. On Actost and Januvia daily and Omega 3 Fish oils/statins for Hyperlipidemia..  Testing twice a day. Trying to eat better balanced meals and avoiding snacking. BS are dong better 140-180's now. Lab Results  Component Value Date   HGBA1C 7.7 (H) 03/10/2017    Preferred Learning Style:    No preference indicated   Learning Readiness:     Ready  Change in progress   MEDICATIONS:  See list.   DIETARY INTAKE:  Eats 3 meals per day.  Usual physical activity: ADL walks some.  Estimated energy needs: 1500  calories 170 g carbohydrates 112 g protein 42 g fat  Progress Towards Goal(s):  In progress.   Nutritional Diagnosis:  NB-1.1 Food and nutrition-related knowledge deficit As related to Diabetes Type 2.  As evidenced by A1C 7.7%.    Intervention:  Nutrition and Diabetes education provided on My Plate, CHO counting, meal planning, portion sizes, timing of meals, avoiding snacks between meals unless having a low blood sugar, target ranges for A1C and blood sugars, signs/symptoms and treatment of hyper/hypoglycemia, monitoring blood sugars, taking medications as prescribed, benefits of exercising 30 minutes per day and prevention of complications of DM. Goals 1.Follow My Plate 2. Increase fresh fruit and vegetables. 3. Keep water 4. Eat meals on time 5. Eat 2-3 carb choices per meal Test blood sugars once a day in am or evening..  Teaching Method Utilized:  Visual Auditory Hands on  Handouts given during visit include:  The Plate Method   Meal Plan Card   Barriers to learning/adherence to lifestyle change: none  Demonstrated degree of understanding via:  Teach Back   Monitoring/Evaluation:  Dietary intake, exercise, meal planning, SBG, and body weight in 3 month(s).

## 2017-03-17 NOTE — Progress Notes (Signed)
Consult Note       03/17/2017, 4:47 PM   Subjective:    Patient ID: Madeline Fox, female    DOB: Nov 22, 1945.  Madeline Fox is being seen in consultation for management of currently uncontrolled symptomatic diabetes requested by  Chiquita Loth, MD.   Past Medical History:  Diagnosis Date  . Anemia    iron deficiency anemia  . Anxiety   . Arthritis   . Breast cancer ([place]) 2007   LT LUMPECTOMY  . Cancer ([place]) 2007   L BREAST CANCER - CHEMO/RADIATION /LUMPECTOMY    . Diabetes mellitus without complication (HCC)    Type 2  . Diverticulosis of colon with hemorrhage   . Goiter   . Heart murmur   . Hyperlipidemia   . Hypertension   . Hyperthyroidism   . Hypothyroidism    s/p thyroid ablation  . Internal hemorrhoids   . Neuromuscular disorder ([place])    NEUROPATHY IN TOES  . Nocturia   . Ovarian carcinoma in situ   . Pneumonia   . Psoriasis   . S/P chemotherapy, time since greater than 12 weeks 2007   BREAST CA  . S/P radiation > 12 weeks 2007   BREAST CA  . Thrombocytopenia ([place])    Past Surgical History:  Procedure Laterality Date  . ABDOMINAL HYSTERECTOMY    . APPENDECTOMY    . BREAST BIOPSY Left 2007   POS  . BREAST LUMPECTOMY    . CHOLECYSTECTOMY    . COLONOSCOPY WITH PROPOFOL N/A 04/12/2015   Procedure: COLONOSCOPY WITH PROPOFOL;  Surgeon: Hulen Luster, MD;  Location: [facility] ENDOSCOPY;  Service: Gastroenterology;  Laterality: N/A;  . FINE NEEDLE ASPIRATION Left    left upper lobe thyroid - benign  . MASTECTOMY PARTIAL / LUMPECTOMY Left 2007   chemo and radiation  . OOPHORECTOMY     for carcinoma in situ  . THYROIDECTOMY  11/19/2011   Procedure: THYROIDECTOMY;  Surgeon: Earnstine Regal, MD;  Location: [facility] ORS;  Service: General;  Laterality: N/A;  Total Thyroidectomy  . THYROIDECTOMY     Social History   Socioeconomic History  . Marital status: Married    Spouse name: None  .  Number of children: None  . Years of education: None  . Highest education level: None  Social Needs  . Financial resource strain: None  . Food insecurity - worry: None  . Food insecurity - inability: None  . Transportation needs - medical: None  . Transportation needs - non-medical: None  Occupational History  . None  Tobacco Use  . Smoking status: Never Smoker  . Smokeless tobacco: Never Used  Substance and Sexual Activity  . Alcohol use: No  . Drug use: No  . Sexual activity: None  Other Topics Concern  . None  Social History Narrative  . None   Outpatient Encounter Medications as of 03/17/2017  Medication Sig  . enalapril (VASOTEC) 2.5 MG tablet Take 2.5 mg by mouth daily.  Marland Kitchen ALPRAZolam (XANAX) 0.25 MG tablet Take 0.25 mg by mouth at bedtime as needed. Rx is expired - pt & pt's daughter state that pt  has taken one everyday since Saturday.  Marland Kitchen aspirin 81 MG tablet Take 81 mg by mouth every morning.   . cetirizine (ZYRTEC) 10 MG tablet Take 10 mg by mouth at bedtime.  . Cholecalciferol (VITAMIN D3) 1000 units CAPS Take daily by mouth.  . ferrous sulfate 325 (65 FE) MG tablet Take 325 mg by mouth 2 (two) times daily.  Marland Kitchen levothyroxine (SYNTHROID, LEVOTHROID) 88 MCG tablet Take 88 mcg daily before breakfast by mouth. 1 tab Sun-Fri   1 1/2 tab on Sun  . lovastatin (MEVACOR) 40 MG tablet Take 40 mg by mouth at bedtime.   . Omega-3 Fatty Acids (FISH OIL) 1000 MG CAPS Take daily by mouth.  Marland Kitchen omeprazole (PRILOSEC) 40 MG capsule Take 40 mg daily by mouth.  . pioglitazone (ACTOS) 15 MG tablet Take 15 mg daily by mouth.  . sitaGLIPtin (JANUVIA) 100 MG tablet Take 100 mg by mouth daily.  . vitamin B-12 (CYANOCOBALAMIN) 1000 MCG tablet Take 1,000 mcg by mouth daily.   No facility-administered encounter medications on file as of 03/17/2017.     ALLERGIES: No Known Allergies  VACCINATION STATUS: There is no immunization history for the selected administration types on file for this  patient.  Diabetes  She presents for her follow-up diabetic visit. She has type 2 diabetes mellitus. Onset time: She was diagnosed at approximate age of 67 years. Denies any history of gestational diabetes. Her disease course has been worsening. There are no hypoglycemic associated symptoms. Pertinent negatives for hypoglycemia include no confusion, headaches, pallor or seizures. There are no diabetic associated symptoms. Pertinent negatives for diabetes include no chest pain, no polydipsia, no polyphagia and no polyuria. There are no hypoglycemic complications. Symptoms are worsening. Diabetic complications include nephropathy. Risk factors for coronary artery disease include diabetes mellitus, dyslipidemia, family history, hypertension, obesity, sedentary lifestyle and post-menopausal. Her weight is stable. She is following a generally unhealthy diet. When asked about meal planning, she reported none. She has not had a previous visit with a dietitian. She never participates in exercise. Her breakfast blood glucose range is generally 140-180 mg/dl. Her bedtime blood glucose range is generally 140-180 mg/dl. Her overall blood glucose range is 140-180 mg/dl. An ACE inhibitor/angiotensin II receptor blocker is not being taken. She does not see a podiatrist.Eye exam is current.  Hyperlipidemia  This is a chronic problem. The current episode started more than 1 year ago. Exacerbating diseases include chronic renal disease, diabetes, hypothyroidism and obesity. Pertinent negatives include no chest pain, myalgias or shortness of breath. Current antihyperlipidemic treatment includes statins. Risk factors for coronary artery disease include diabetes mellitus, dyslipidemia, family history, hypertension, obesity, a sedentary lifestyle and post-menopausal.  Hypertension  This is a chronic problem. The current episode started more than 1 year ago. The problem is uncontrolled. Pertinent negatives include no chest pain,  headaches, palpitations or shortness of breath. Risk factors for coronary artery disease include diabetes mellitus, dyslipidemia, obesity and sedentary lifestyle. Identifiable causes of hypertension include chronic renal disease.    Review of Systems  Constitutional: Positive for unexpected weight change. Negative for chills and fever.  HENT: Negative for trouble swallowing and voice change.   Eyes: Negative for visual disturbance.  Respiratory: Negative for cough, shortness of breath and wheezing.   Cardiovascular: Negative for chest pain, palpitations and leg swelling.  Gastrointestinal: Negative for diarrhea, nausea and vomiting.  Endocrine: Negative for cold intolerance, heat intolerance, polydipsia, polyphagia and polyuria.  Musculoskeletal: Negative for arthralgias and myalgias.  Skin: Negative for  color change, pallor, rash and wound.  Neurological: Negative for seizures and headaches.  Psychiatric/Behavioral: Negative for confusion and suicidal ideas.    Objective:    BP (!) 154/64   Pulse 66   Ht 5\' 3"  (1.6 m)   Wt 168 lb (76.2 kg)   BMI 29.76 kg/m   Wt Readings from Last 3 Encounters:  03/17/17 168 lb (76.2 kg)  03/17/17 168 lb (76.2 kg)  12/15/16 182 lb (82.6 kg)     Physical Exam  Constitutional: She is oriented to person, place, and time. She appears well-developed.  HENT:  Head: Normocephalic and atraumatic.  Eyes: EOM are normal.  Neck: Normal range of motion. Neck supple. No tracheal deviation present. No thyromegaly present.  Cardiovascular: Normal rate and regular rhythm.  Pulmonary/Chest: Effort normal and breath sounds normal.  Abdominal: Soft. Bowel sounds are normal. There is no tenderness. There is no guarding.  Musculoskeletal: Normal range of motion. She exhibits no edema.  Neurological: She is alert and oriented to person, place, and time. She has normal reflexes. No cranial nerve deficit. Coordination normal.  Skin: Skin is warm and dry. No rash  noted. No erythema. No pallor.  Psychiatric: She has a normal mood and affect. Judgment normal.   Recent Results (from the past 2160 hour(s))  COMPLETE METABOLIC PANEL WITH GFR     Status: Abnormal   Collection Time: 03/10/17  8:31 AM  Result Value Ref Range   Glucose, Bld 193 (H) 65 - 99 mg/dL    Comment: .            Fasting reference interval . For someone without known diabetes, a glucose value >125 mg/dL indicates that they may have diabetes and this should be confirmed with a follow-up test. .    BUN 17 7 - 25 mg/dL   Creat 1.32 (H) 0.60 - 0.93 mg/dL    Comment: For patients >57 years of age, the reference limit for Creatinine is approximately 13% higher for people identified as African-American. .    GFR, Est Non African American 40 (L) > OR = 60 mL/min/1.72m2   GFR, Est African American 46 (L) > OR = 60 mL/min/1.37m2   BUN/Creatinine Ratio 13 6 - 22 (calc)   Sodium 134 (L) 135 - 146 mmol/L   Potassium 4.9 3.5 - 5.3 mmol/L   Chloride 101 98 - 110 mmol/L   CO2 26 20 - 32 mmol/L   Calcium 9.3 8.6 - 10.4 mg/dL   Total Protein 7.5 6.1 - 8.1 g/dL   Albumin 4.3 3.6 - 5.1 g/dL   Globulin 3.2 1.9 - 3.7 g/dL (calc)   AG Ratio 1.3 1.0 - 2.5 (calc)   Total Bilirubin 0.5 0.2 - 1.2 mg/dL   Alkaline phosphatase (APISO) 66 33 - 130 U/L   AST 16 10 - 35 U/L   ALT 8 6 - 29 U/L  Hemoglobin A1c     Status: Abnormal   Collection Time: 03/10/17  8:31 AM  Result Value Ref Range   Hgb A1c MFr Bld 7.7 (H) <5.7 % of total Hgb    Comment: For someone without known diabetes, a hemoglobin A1c value of 6.5% or greater indicates that they may have  diabetes and this should be confirmed with a follow-up  test. . For someone with known diabetes, a value <7% indicates  that their diabetes is well controlled and a value  greater than or equal to 7% indicates suboptimal  control. A1c targets should be individualized  based on  duration of diabetes, age, comorbid conditions, and  other  considerations. . Currently, no consensus exists regarding use of hemoglobin A1c for diagnosis of diabetes for children. .    Mean Plasma Glucose 174 (calc)   eAG (mmol/L) 9.7 (calc)  Lipid panel     Status: Abnormal   Collection Time: 03/10/17  8:31 AM  Result Value Ref Range   Cholesterol 136 <200 mg/dL   HDL 38 (L) >50 mg/dL   Triglycerides 166 (H) <150 mg/dL   LDL Cholesterol (Calc) 74 mg/dL (calc)    Comment: Reference range: <100 . Desirable range <100 mg/dL for primary prevention;   <70 mg/dL for patients with CHD or diabetic patients  with > or = 2 CHD risk factors. Marland Kitchen LDL-C is now calculated using the Martin-Hopkins  calculation, which is a validated novel method providing  better accuracy than the Friedewald equation in the  estimation of LDL-C.  Cresenciano Genre et al. Annamaria Helling. 8546;270(35): 2061-2068  (http://education.[employer].com/faq/FAQ164)    Total CHOL/HDL Ratio 3.6 <5.0 (calc)   Non-HDL Cholesterol (Calc) 98 <130 mg/dL (calc)    Comment: For patients with diabetes plus 1 major ASCVD risk  factor, treating to a non-HDL-C goal of <100 mg/dL  (LDL-C of <70 mg/dL) is considered a therapeutic  option.       Assessment & Plan:   1. Type 2 diabetes mellitus with stage 3 chronic kidney disease, without long-term current use of insulin (HCC)  - Madeline Fox has currently uncontrolled symptomatic type 2 DM since  72 years of age. - Her repeat labs show A1c of 7.7%, she came with controlled fasting blood glucose profile, slightly above target postprandial glucose profile. -her diabetes is complicated by stage 3 renal insufficiency, obesity/sedentary life and Madeline Fox remains at a high risk for more acute and chronic complications which include CAD, CVA, CKD, retinopathy, and neuropathy. These are all discussed in detail with the patient.  - I have counseled her on diet management and weight loss, by adopting a carbohydrate restricted/protein rich  diet.  -  Suggestion is made for her to avoid simple carbohydrates  from her diet including Cakes, Sweet Desserts / Pastries, Ice Cream, Soda (diet and regular), Sweet Tea, Candies, Chips, Cookies, Store Bought Juices, Alcohol in Excess of  1-2 drinks a day, Artificial Sweeteners, and "Sugar-free" Products. This will help patient to have stable blood glucose profile and potentially avoid unintended weight gain.  - I encouraged her to switch to  unprocessed or minimally processed complex starch and increased protein intake (animal or plant source), fruits, and vegetables.  - she is advised to stick to a routine mealtimes to eat 3 meals  a day and avoid unnecessary snacks ( to snack only to correct hypoglycemia).   - she will be scheduled with Madeline Fox, Madeline Fox, Madeline Fox for individualized diabetes education- consult pending.  - I have approached her with the following individualized plan to manage diabetes and patient agrees:   - Based on presenting blood glucose profile between 140-180,  and her A1c was 7.7%, she will not require initiation of insulin treatment at this time. - She'll continue to monitor blood glucose twice a day-before breakfast and at bedtime and , she is encouraged to call clinic for blood glucose levels less than 70 or above 200 mg /dl. - I will continue Januvia 100 mg by mouth daily, therapeutically suitable for patient . - I advised her to continue with Actos 15 mg by mouth daily for now.  This medication will be stopped if she is initiated on insulin treatment.  - Patient specific target  A1c;  LDL, HDL, Triglycerides, and  Waist Circumference were discussed in detail.  2) BP/HTN: Her blood pressure is not controlled to target.   she insists that her blood pressure is always controlled at home, and has problem with doctor's office which raises her blood pressure.  I advised her to Continue current medications .   3) Lipids/HPL: uncontrolled, LDL was 97 in April 2018.   Patient  is advised to continue statins. 4)  Weight/Diet: Madeline Fox Consult has been  initiated , exercise, and detailed carbohydrates information provided.  5) Postsurgical hypothyroidism - She underwent total thyroidectomy for large multinodular goiter in 2013.   I advised her to continue levothyroxine 88 micrograms by mouth every morning.    - We discussed about correct intake of levothyroxine, at fasting, with water, separated by at least 30 minutes from breakfast, and separated by more than 4 hours from calcium, iron, multivitamins, acid reflux medications (PPIs). -Patient is made aware of the fact that thyroid hormone replacement is needed for life, dose to be adjusted by periodic monitoring of thyroid function tests.  - She may need surveillance thyroid/neck ultrasound on subsequent visits.  6) Chronic Care/Health Maintenance:  -she  is on Statin medications and  is encouraged to continue to follow up with Ophthalmology, Dentist,  Podiatrist at least yearly or according to recommendations, and advised to   stay away from smoking. I have recommended yearly flu vaccine and pneumonia vaccination at least every 5 years; moderate intensity exercise for up to 150 minutes weekly; and  sleep for at least 7 hours a day.  - I advised patient to maintain close follow up with Nash Mantis Delano Metz, MD for primary care needs.  - Time spent with the patient: 25 min, of which >50% was spent in reviewing her blood glucose logs , discussing her hypo- and hyper-glycemic episodes, reviewing her current and  previous labs and insulin doses and developing a plan to avoid hypo- and hyper-glycemia. Please refer to Patient Instructions for Blood Glucose Monitoring and Insulin/Medications Dosing Guide"  in media tab for additional information.   Follow up plan: - Return in about 3 months (around 06/14/2017) for meter, and logs.  Glade Lloyd, MD [facility] [address] [place], [place] [address] Phone: [phone]  Fax: [phone]    03/17/2017, 4:47 PM  This note was partially dictated with voice recognition software. Similar sounding words can be transcribed inadequately or may not  be corrected upon review.

## 2017-03-17 NOTE — Patient Instructions (Signed)

## 2017-03-24 ENCOUNTER — Encounter: Payer: Self-pay | Admitting: Nutrition

## 2017-04-27 ENCOUNTER — Telehealth: Payer: Self-pay | Admitting: "Endocrinology

## 2017-04-27 NOTE — Telephone Encounter (Signed)
Nandita is calling stating that we need to call the [employer] at [phone] Opt 5 and give them her Januvia dosage and instructions, please advise?

## 2017-04-28 NOTE — Telephone Encounter (Signed)
Pharmacy notified of directions.

## 2017-06-23 ENCOUNTER — Ambulatory Visit: Payer: Medicare HMO | Admitting: "Endocrinology

## 2017-06-23 ENCOUNTER — Ambulatory Visit: Payer: Medicare HMO | Admitting: Nutrition

## 2017-06-23 LAB — COMPLETE METABOLIC PANEL WITH GFR
AG RATIO: 1.4 (calc) (ref 1.0–2.5)
ALT: 8 U/L (ref 6–29)
AST: 16 U/L (ref 10–35)
Albumin: 4.2 g/dL (ref 3.6–5.1)
Alkaline phosphatase (APISO): 67 U/L (ref 33–130)
BILIRUBIN TOTAL: 0.4 mg/dL (ref 0.2–1.2)
BUN/Creatinine Ratio: 15 (calc) (ref 6–22)
BUN: 17 mg/dL (ref 7–25)
CHLORIDE: 97 mmol/L — AB (ref 98–110)
CO2: 30 mmol/L (ref 20–32)
Calcium: 9.5 mg/dL (ref 8.6–10.4)
Creat: 1.16 mg/dL — ABNORMAL HIGH (ref 0.60–0.93)
GFR, Est African American: 54 mL/min/{1.73_m2} — ABNORMAL LOW (ref >=60)
GFR, Est Non African American: 46 mL/min/{1.73_m2} — ABNORMAL LOW (ref >=60)
GLUCOSE: 149 mg/dL — AB (ref 65–99)
Globulin: 2.9 g/dL (calc) (ref 1.9–3.7)
POTASSIUM: 4.8 mmol/L (ref 3.5–5.3)
Sodium: 132 mmol/L — ABNORMAL LOW (ref 135–146)
TOTAL PROTEIN: 7.1 g/dL (ref 6.1–8.1)

## 2017-06-23 LAB — HEMOGLOBIN A1C
EAG (MMOL/L): 8.1 (calc)
HEMOGLOBIN A1C: 6.7 %{Hb} — AB (ref <5.7)
MEAN PLASMA GLUCOSE: 146 (calc)

## 2017-06-28 ENCOUNTER — Encounter: Payer: Medicare HMO | Attending: "Endocrinology | Admitting: Nutrition

## 2017-06-28 ENCOUNTER — Encounter: Payer: Self-pay | Admitting: "Endocrinology

## 2017-06-28 ENCOUNTER — Ambulatory Visit (INDEPENDENT_AMBULATORY_CARE_PROVIDER_SITE_OTHER): Payer: Medicare HMO | Admitting: "Endocrinology

## 2017-06-28 ENCOUNTER — Encounter: Payer: Self-pay | Admitting: Nutrition

## 2017-06-28 VITALS — BP 134/68 | HR 64 | Ht 63.0 in | Wt 158.0 lb

## 2017-06-28 VITALS — Ht 63.0 in | Wt 158.0 lb

## 2017-06-28 DIAGNOSIS — I10 Essential (primary) hypertension: Secondary | ICD-10-CM

## 2017-06-28 DIAGNOSIS — E782 Mixed hyperlipidemia: Secondary | ICD-10-CM

## 2017-06-28 DIAGNOSIS — E1122 Type 2 diabetes mellitus with diabetic chronic kidney disease: Secondary | ICD-10-CM

## 2017-06-28 DIAGNOSIS — E890 Postprocedural hypothyroidism: Secondary | ICD-10-CM | POA: Diagnosis not present

## 2017-06-28 DIAGNOSIS — N183 Chronic kidney disease, stage 3 (moderate): Secondary | ICD-10-CM

## 2017-06-28 DIAGNOSIS — E118 Type 2 diabetes mellitus with unspecified complications: Secondary | ICD-10-CM

## 2017-06-28 DIAGNOSIS — IMO0002 Reserved for concepts with insufficient information to code with codable children: Secondary | ICD-10-CM

## 2017-06-28 DIAGNOSIS — Z713 Dietary counseling and surveillance: Secondary | ICD-10-CM | POA: Diagnosis not present

## 2017-06-28 DIAGNOSIS — E1165 Type 2 diabetes mellitus with hyperglycemia: Secondary | ICD-10-CM

## 2017-06-28 MED ORDER — ACCU-CHEK SMARTVIEW VI STRP: 1.0000 | ORAL_STRIP | Freq: Two times a day (BID) | 3 refills | Status: DC

## 2017-06-28 NOTE — Progress Notes (Signed)
  Medical Nutrition Therapy:  Appt start time: 1530 end time:  1600  Assessment:  Primary concerns today: Diabetes Type 2. Saw DR. Nida, Endocrinology today. A1C down to 6.7% from 7.7%. Marland Kitchen Has been eating better balanced meals and more vegetables, fruit. On Actos and Januvia daily and Omega 3 Fish oils/statins for Hyperlipidemia..  Testing twice a day.  Lost 10 lbs Trying to eat better balanced meals and avoiding snacking. BS are dong better 120-150  Bedtime: less than 150  No low blood sugars.   Lab Results  Component Value Date   HGBA1C 6.7 (H) 06/22/2017    Preferred Learning Style:    No preference indicated   Learning Readiness:     Ready  Change in progress   MEDICATIONS:  See list.   DIETARY INTAKE:  B) 1 pancake, fruit and egg L)Green beans, mustard greens, fruit, and meatloaf,  Water D) same as lunch and fruit, meatloaf, Drinks water only.   Usual physical activity: ADL walks some.  Estimated energy needs: 1500  calories 170 g carbohydrates 112 g protein 42 g fat  Progress Towards Goal(s):  In progress.   Nutritional Diagnosis:  NB-1.1 Food and nutrition-related knowledge deficit As related to Diabetes Type 2.  As evidenced by A1C 7.7%.    Intervention:  Nutrition and Diabetes education provided on My Plate, CHO counting, meal planning, portion sizes, timing of meals, avoiding snacks between meals unless having a low blood sugar, target ranges for A1C and blood sugars, signs/symptoms and treatment of hyper/hypoglycemia, monitoring blood sugars, taking medications as prescribed, benefits of exercising 30 minutes per day and prevention of complications of DM. Goals 1.Follow My Plate 2. Increase fresh fruit and vegetables. 3. Keep water 4. Eat meals on time 5. Eat 2-3 carb choices per meal Test blood sugars once a day in am or evening..  Teaching Method Utilized:  Visual Auditory Hands on  Handouts given during visit include:  The Plate  Method   Meal Plan Card   Barriers to learning/adherence to lifestyle change: none  Demonstrated degree of understanding via:  Teach Back   Monitoring/Evaluation:  Dietary intake, exercise, meal planning, SBG, and body weight in 3 month(s).

## 2017-06-28 NOTE — Progress Notes (Signed)
Consult Note       06/28/2017, 3:38 PM   Subjective:    Patient ID: Madeline Fox, female    DOB: December 04, 1945.  Madeline Fox is being seen in consultation for management of currently uncontrolled symptomatic diabetes requested by  Chiquita Loth, MD.   Past Medical History:  Diagnosis Date  . Anemia    iron deficiency anemia  . Anxiety   . Arthritis   . Breast cancer ([place]) 2007   LT LUMPECTOMY  . Cancer ([place]) 2007   L BREAST CANCER - CHEMO/RADIATION /LUMPECTOMY    . Diabetes mellitus without complication (HCC)    Type 2  . Diverticulosis of colon with hemorrhage   . Goiter   . Heart murmur   . Hyperlipidemia   . Hypertension   . Hyperthyroidism   . Hypothyroidism    s/p thyroid ablation  . Internal hemorrhoids   . Neuromuscular disorder ([place])    NEUROPATHY IN TOES  . Nocturia   . Ovarian carcinoma in situ   . Pneumonia   . Psoriasis   . S/P chemotherapy, time since greater than 12 weeks 2007   BREAST CA  . S/P radiation > 12 weeks 2007   BREAST CA  . Thrombocytopenia ([place])    Past Surgical History:  Procedure Laterality Date  . ABDOMINAL HYSTERECTOMY    . APPENDECTOMY    . BREAST BIOPSY Left 2007   POS  . BREAST LUMPECTOMY    . CHOLECYSTECTOMY    . COLONOSCOPY WITH PROPOFOL N/A 04/12/2015   Procedure: COLONOSCOPY WITH PROPOFOL;  Surgeon: Hulen Luster, MD;  Location: [facility] ENDOSCOPY;  Service: Gastroenterology;  Laterality: N/A;  . FINE NEEDLE ASPIRATION Left    left upper lobe thyroid - benign  . MASTECTOMY PARTIAL / LUMPECTOMY Left 2007   chemo and radiation  . OOPHORECTOMY     for carcinoma in situ  . THYROIDECTOMY  11/19/2011   Procedure: THYROIDECTOMY;  Surgeon: Earnstine Regal, MD;  Location: [facility] ORS;  Service: General;  Laterality: N/A;  Total Thyroidectomy  . THYROIDECTOMY     Social History   Socioeconomic History  . Marital status: Married    Spouse name: Not on  file  . Number of children: Not on file  . Years of education: Not on file  . Highest education level: Not on file  Occupational History  . Not on file  Social Needs  . Financial resource strain: Not on file  . Food insecurity:    Worry: Not on file    Inability: Not on file  . Transportation needs:    Medical: Not on file    Non-medical: Not on file  Tobacco Use  . Smoking status: Never Smoker  . Smokeless tobacco: Never Used  Substance and Sexual Activity  . Alcohol use: No  . Drug use: No  . Sexual activity: Not on file  Lifestyle  . Physical activity:    Days per week: Not on file    Minutes per session: Not on file  . Stress: Not on file  Relationships  . Social connections:    Talks on phone: Not  on file    Gets together: Not on file    Attends religious service: Not on file    Active member of club or organization: Not on file    Attends meetings of clubs or organizations: Not on file    Relationship status: Not on file  Other Topics Concern  . Not on file  Social History Narrative  . Not on file   Outpatient Encounter Medications as of 06/28/2017  Medication Sig  . ACCU-CHEK SMARTVIEW test strip 1 each by Other route 2 (two) times daily.  Marland Kitchen aspirin 81 MG tablet Take 81 mg by mouth every morning.   . cetirizine (ZYRTEC) 10 MG tablet Take 10 mg by mouth at bedtime.  . Cholecalciferol (VITAMIN D3) 1000 units CAPS Take daily by mouth.  . diazepam (VALIUM) 5 MG tablet at bedtime.  . ferrous sulfate 325 (65 FE) MG tablet Take 325 mg by mouth 2 (two) times daily.  . hydrOXYzine (ATARAX/VISTARIL) 25 MG tablet 3 (three) times daily as needed.  Marland Kitchen levothyroxine (SYNTHROID, LEVOTHROID) 88 MCG tablet Take 88 mcg daily before breakfast by mouth. 1 tab Sun-Fri   1 1/2 tab on Sun  . lovastatin (MEVACOR) 40 MG tablet Take 40 mg by mouth at bedtime.   . Omega-3 Fatty Acids (FISH OIL) 1000 MG CAPS Take daily by mouth.  Marland Kitchen omeprazole (PRILOSEC) 40 MG capsule Take 40 mg daily by  mouth.  . pioglitazone (ACTOS) 15 MG tablet Take 15 mg daily by mouth.  . sitaGLIPtin (JANUVIA) 100 MG tablet Take 100 mg by mouth daily.  . vitamin B-12 (CYANOCOBALAMIN) 1000 MCG tablet Take 1,000 mcg by mouth daily.  . [DISCONTINUED] ACCU-CHEK SMARTVIEW test strip 2 (two) times daily.  . [DISCONTINUED] ALPRAZolam (XANAX) 0.25 MG tablet Take 0.25 mg by mouth at bedtime as needed. Rx is expired - pt & pt's daughter state that pt has taken one everyday since Saturday.  . [DISCONTINUED] enalapril (VASOTEC) 2.5 MG tablet Take 2.5 mg by mouth daily.   No facility-administered encounter medications on file as of 06/28/2017.     ALLERGIES: No Known Allergies  VACCINATION STATUS: There is no immunization history for the selected administration types on file for this patient.  Diabetes  She presents for her follow-up diabetic visit. She has type 2 diabetes mellitus. Onset time: She was diagnosed at approximate age of 26 years. Denies any history of gestational diabetes. Her disease course has been improving. There are no hypoglycemic associated symptoms. Pertinent negatives for hypoglycemia include no confusion, headaches, pallor or seizures. There are no diabetic associated symptoms. Pertinent negatives for diabetes include no chest pain, no polydipsia, no polyphagia and no polyuria. There are no hypoglycemic complications. Symptoms are improving. Diabetic complications include nephropathy. Risk factors for coronary artery disease include diabetes mellitus, dyslipidemia, family history, hypertension, obesity, sedentary lifestyle and post-menopausal. Her weight is decreasing steadily (She has lost 24 pounds since November 2018.). She is following a generally unhealthy diet. When asked about meal planning, she reported none. She has not had a previous visit with a dietitian. She never participates in exercise. Her breakfast blood glucose range is generally 140-180 mg/dl. Her bedtime blood glucose range is  generally 140-180 mg/dl. Her overall blood glucose range is 140-180 mg/dl. An ACE inhibitor/angiotensin II receptor blocker is not being taken. She does not see a podiatrist.Eye exam is current.  Hyperlipidemia  This is a chronic problem. The current episode started more than 1 year ago. Exacerbating diseases include chronic renal disease, diabetes,  hypothyroidism and obesity. Pertinent negatives include no chest pain, myalgias or shortness of breath. Current antihyperlipidemic treatment includes statins. Risk factors for coronary artery disease include diabetes mellitus, dyslipidemia, family history, hypertension, obesity, a sedentary lifestyle and post-menopausal.  Hypertension  This is a chronic problem. The current episode started more than 1 year ago. The problem is uncontrolled. Pertinent negatives include no chest pain, headaches, palpitations or shortness of breath. Risk factors for coronary artery disease include diabetes mellitus, dyslipidemia, obesity and sedentary lifestyle. Identifiable causes of hypertension include chronic renal disease.    Review of Systems  Constitutional: Positive for unexpected weight change. Negative for chills and fever.  HENT: Negative for trouble swallowing and voice change.   Eyes: Negative for visual disturbance.  Respiratory: Negative for cough, shortness of breath and wheezing.   Cardiovascular: Negative for chest pain, palpitations and leg swelling.  Gastrointestinal: Negative for diarrhea, nausea and vomiting.  Endocrine: Negative for cold intolerance, heat intolerance, polydipsia, polyphagia and polyuria.  Musculoskeletal: Negative for arthralgias and myalgias.  Skin: Negative for color change, pallor, rash and wound.  Neurological: Negative for seizures and headaches.  Psychiatric/Behavioral: Negative for confusion and suicidal ideas.    Objective:    BP 134/68   Pulse 64   Ht 5\' 3"  (1.6 m)   Wt 158 lb (71.7 kg)   BMI 27.99 kg/m   Wt  Readings from Last 3 Encounters:  06/28/17 158 lb (71.7 kg)  03/17/17 168 lb (76.2 kg)  03/17/17 168 lb (76.2 kg)     Physical Exam  Constitutional: She is oriented to person, place, and time. She appears well-developed.  HENT:  Head: Normocephalic and atraumatic.  Eyes: EOM are normal.  Neck: Normal range of motion. Neck supple. No tracheal deviation present. No thyromegaly present.  Cardiovascular: Normal rate and regular rhythm.  Pulmonary/Chest: Effort normal.  Abdominal: There is no tenderness. There is no guarding.  Musculoskeletal: Normal range of motion. She exhibits no edema.  Neurological: She is alert and oriented to person, place, and time. She has normal reflexes. No cranial nerve deficit. Coordination normal.  Skin: Skin is warm and dry. No rash noted. No erythema. No pallor.  Psychiatric: She has a normal mood and affect. Judgment normal.   Recent Results (from the past 2160 hour(s))  COMPLETE METABOLIC PANEL WITH GFR     Status: Abnormal   Collection Time: 06/22/17  8:21 AM  Result Value Ref Range   Glucose, Bld 149 (H) 65 - 99 mg/dL    Comment: .            Fasting reference interval . For someone without known diabetes, a glucose value >125 mg/dL indicates that they may have diabetes and this should be confirmed with a follow-up test. .    BUN 17 7 - 25 mg/dL   Creat 1.16 (H) 0.60 - 0.93 mg/dL    Comment: For patients >32 years of age, the reference limit for Creatinine is approximately 13% higher for people identified as African-American. .    GFR, Est Non African American 46 (L) > OR = 60 mL/min/1.38m2   GFR, Est African American 54 (L) > OR = 60 mL/min/1.31m2   BUN/Creatinine Ratio 15 6 - 22 (calc)   Sodium 132 (L) 135 - 146 mmol/L   Potassium 4.8 3.5 - 5.3 mmol/L   Chloride 97 (L) 98 - 110 mmol/L   CO2 30 20 - 32 mmol/L   Calcium 9.5 8.6 - 10.4 mg/dL   Total Protein 7.1  6.1 - 8.1 g/dL   Albumin 4.2 3.6 - 5.1 g/dL   Globulin 2.9 1.9 - 3.7 g/dL  (calc)   AG Ratio 1.4 1.0 - 2.5 (calc)   Total Bilirubin 0.4 0.2 - 1.2 mg/dL   Alkaline phosphatase (APISO) 67 33 - 130 U/L   AST 16 10 - 35 U/L   ALT 8 6 - 29 U/L  Hemoglobin A1c     Status: Abnormal   Collection Time: 06/22/17  8:21 AM  Result Value Ref Range   Hgb A1c MFr Bld 6.7 (H) <5.7 % of total Hgb    Comment: For someone without known diabetes, a hemoglobin A1c value of 6.5% or greater indicates that they may have  diabetes and this should be confirmed with a follow-up  test. . For someone with known diabetes, a value <7% indicates  that their diabetes is well controlled and a value  greater than or equal to 7% indicates suboptimal  control. A1c targets should be individualized based on  duration of diabetes, age, comorbid conditions, and  other considerations. . Currently, no consensus exists regarding use of hemoglobin A1c for diagnosis of diabetes for children. .    Mean Plasma Glucose 146 (calc)   eAG (mmol/L) 8.1 (calc)   Lipid Panel     Component Value Date/Time   CHOL 136 03/10/2017 0831   TRIG 166 (H) 03/10/2017 0831   HDL 38 (L) 03/10/2017 0831   CHOLHDL 3.6 03/10/2017 0831   LDLCALC 74 03/10/2017 0831      Assessment & Plan:   1. Type 2 diabetes mellitus with stage 3 chronic kidney disease, without long-term current use of insulin (HCC)  - Madeline Fox has currently uncontrolled symptomatic type 2 DM since  72 years of age. -She returns with significant improvement in her glycemic profile and A1c of 6.7% improving from 7.7%.    -her diabetes is complicated by stage 3 renal insufficiency, obesity/sedentary life and Madeline Fox remains at a high risk for more acute and chronic complications which include CAD, CVA, CKD, retinopathy, and neuropathy. These are all discussed in detail with the patient.  - I have counseled her on diet management and weight loss, by adopting a carbohydrate restricted/protein rich diet.  -  Suggestion is made for  her to avoid simple carbohydrates  from her diet including Cakes, Sweet Desserts / Pastries, Ice Cream, Soda (diet and regular), Sweet Tea, Candies, Chips, Cookies, Store Bought Juices, Alcohol in Excess of  1-2 drinks a day, Artificial Sweeteners, and "Sugar-free" Products. This will help patient to have stable blood glucose profile and potentially avoid unintended weight gain.   - I encouraged her to switch to  unprocessed or minimally processed complex starch and increased protein intake (animal or plant source), fruits, and vegetables.  - she is advised to stick to a routine mealtimes to eat 3 meals  a day and avoid unnecessary snacks ( to snack only to correct hypoglycemia).   - she has been scheduled with Madeline Fox, RDN, Madeline Fox for individualized diabetes education- consult pending.  - I have approached her with the following individualized plan to manage diabetes and patient agrees:   - Based on presenting blood glucose profile between with improved A1c of 6.7%,  she will not require initiation of insulin treatment at this time. - She'll continue to monitor blood glucose twice a day-before breakfast and at bedtime and , she is encouraged to call clinic for blood glucose levels less than 70 or above 200  mg /dl. - I will continue Januvia 100 mg by mouth daily, therapeutically suitable for patient . - I advised her to continue with Actos 15 mg by mouth daily for now. This medication will be stopped if she is initiated on insulin treatment.  - Patient specific target  A1c;  LDL, HDL, Triglycerides, and  Waist Circumference were discussed in detail.  2) BP/HTN: Her blood pressure is controlled to target.   I advised her to Continue current medications .   3) Lipids/HPL: Her recent lipid panel showed controlled LDL of 74.  She is advised to continue lovastatin 40 mg p.o. nightly.   4)  Weight/Diet: Madeline Fox Consult has been  initiated , exercise, and detailed carbohydrates information  provided.  5) Postsurgical hypothyroidism - She underwent total thyroidectomy for large multinodular goiter in 2013.  -I advised her to continue levothyroxine at 88 mcg p.o. before breakfast.   - We discussed about correct intake of levothyroxine, at fasting, with water, separated by at least 30 minutes from breakfast, and separated by more than 4 hours from calcium, iron, multivitamins, acid reflux medications (PPIs). -Patient is made aware of the fact that thyroid hormone replacement is needed for life, dose to be adjusted by periodic monitoring of thyroid function tests.  - She may need surveillance thyroid/neck ultrasound on subsequent visits.  6) Chronic Care/Health Maintenance:  -she  is on Statin medications and  is encouraged to continue to follow up with Ophthalmology, Dentist,  Podiatrist at least yearly or according to recommendations, and advised to   stay away from smoking. I have recommended yearly flu vaccine and pneumonia vaccination at least every 5 years; moderate intensity exercise for up to 150 minutes weekly; and  sleep for at least 7 hours a day.  - I advised patient to maintain close follow up with Nash Mantis Delano Metz, MD for primary care needs.  - Time spent with the patient: 25 min, of which >50% was spent in reviewing her blood glucose logs , discussing her hypo- and hyper-glycemic episodes, reviewing her current and  previous labs and insulin doses and developing a plan to avoid hypo- and hyper-glycemia. Please refer to Patient Instructions for Blood Glucose Monitoring and Insulin/Medications Dosing Guide"  in media tab for additional information. Madeline Fox participated in the discussions, expressed understanding, and voiced agreement with the above plans.  All questions were answered to her satisfaction. she is encouraged to contact clinic should she have any questions or concerns prior to her return visit.   Follow up plan: - Return in about 4 months (around  10/28/2017) for follow up with pre-visit labs.  Glade Lloyd, MD [facility] [address] [place], [place] [address] Phone: [phone]  Fax: [phone]    06/28/2017, 3:38 PM  This note was partially dictated with voice recognition software. Similar sounding words can be transcribed inadequately or may not  be corrected upon review.

## 2017-06-28 NOTE — Patient Instructions (Signed)

## 2017-06-28 NOTE — Patient Instructions (Signed)
Goals  Keep up the great job!! Be sure to eat 2-3 carb choices per meal Keep A1c 6.5% or less.

## 2017-07-23 ENCOUNTER — Other Ambulatory Visit: Payer: Self-pay | Admitting: "Endocrinology

## 2017-07-26 ENCOUNTER — Other Ambulatory Visit: Payer: Self-pay

## 2017-07-26 MED ORDER — BLOOD GLUCOSE MONITOR KIT: PACK | 5 refills | Status: DC

## 2017-07-27 ENCOUNTER — Other Ambulatory Visit: Payer: Self-pay

## 2017-08-02 ENCOUNTER — Encounter: Payer: Self-pay | Admitting: "Endocrinology

## 2017-08-02 LAB — HM DIABETES EYE EXAM

## 2017-09-22 ENCOUNTER — Encounter: Payer: Self-pay | Admitting: *Deleted

## 2017-09-22 ENCOUNTER — Other Ambulatory Visit: Payer: Self-pay

## 2017-09-22 NOTE — Discharge Instructions (Signed)

## 2017-09-29 ENCOUNTER — Ambulatory Visit
Admission: RE | Admit: 2017-09-29 | Discharge: 2017-09-29 | Disposition: A | Discharge status: Home / Self Care | Payer: Medicare HMO | Source: Ambulatory Visit | Attending: Ophthalmology | Admitting: Ophthalmology

## 2017-09-29 ENCOUNTER — Ambulatory Visit: Payer: Medicare HMO | Admitting: Anesthesiology

## 2017-09-29 ENCOUNTER — Encounter
Admission: RE | Disposition: A | Discharge status: Home / Self Care | Payer: Self-pay | Source: Ambulatory Visit | Attending: Ophthalmology

## 2017-09-29 DIAGNOSIS — D631 Anemia in chronic kidney disease: Secondary | ICD-10-CM | POA: Insufficient documentation

## 2017-09-29 DIAGNOSIS — E890 Postprocedural hypothyroidism: Secondary | ICD-10-CM | POA: Insufficient documentation

## 2017-09-29 DIAGNOSIS — Z853 Personal history of malignant neoplasm of breast: Secondary | ICD-10-CM | POA: Diagnosis not present

## 2017-09-29 DIAGNOSIS — I129 Hypertensive chronic kidney disease with stage 1 through stage 4 chronic kidney disease, or unspecified chronic kidney disease: Secondary | ICD-10-CM | POA: Diagnosis not present

## 2017-09-29 DIAGNOSIS — E1122 Type 2 diabetes mellitus with diabetic chronic kidney disease: Secondary | ICD-10-CM | POA: Insufficient documentation

## 2017-09-29 DIAGNOSIS — E1136 Type 2 diabetes mellitus with diabetic cataract: Secondary | ICD-10-CM | POA: Diagnosis present

## 2017-09-29 DIAGNOSIS — N189 Chronic kidney disease, unspecified: Secondary | ICD-10-CM | POA: Insufficient documentation

## 2017-09-29 DIAGNOSIS — H2511 Age-related nuclear cataract, right eye: Secondary | ICD-10-CM | POA: Diagnosis not present

## 2017-09-29 HISTORY — DX: Presence of dental prosthetic device (complete) (partial): Z97.2

## 2017-09-29 HISTORY — PX: CATARACT EXTRACTION W/PHACO: SHX586

## 2017-09-29 HISTORY — DX: Chronic kidney disease, unspecified: N18.9

## 2017-09-29 LAB — GLUCOSE, CAPILLARY
GLUCOSE-CAPILLARY: 141 mg/dL — AB (ref 70–99)
Glucose-Capillary: 148 mg/dL — ABNORMAL HIGH (ref 70–99)

## 2017-09-29 SURGERY — PHACOEMULSIFICATION, CATARACT, WITH IOL INSERTION
Anesthesia: Monitor Anesthesia Care | Site: Eye | Laterality: Right | Wound class: "Clean "

## 2017-09-29 MED ORDER — LACTATED RINGERS IV SOLN: INTRAVENOUS | Status: DC

## 2017-09-29 MED ORDER — EPINEPHRINE PF 1 MG/ML IJ SOLN
INTRAOCULAR | Status: DC | PRN
  Administered 2017-09-29: 67 mL via OPHTHALMIC

## 2017-09-29 MED ORDER — MIDAZOLAM HCL 2 MG/2ML IJ SOLN
INTRAMUSCULAR | Status: DC | PRN
  Administered 2017-09-29: 1 mg via INTRAVENOUS

## 2017-09-29 MED ORDER — BRIMONIDINE TARTRATE-TIMOLOL 0.2-0.5 % OP SOLN
OPHTHALMIC | Status: DC | PRN
  Administered 2017-09-29: 1 [drp] via OPHTHALMIC

## 2017-09-29 MED ORDER — MOXIFLOXACIN HCL 0.5 % OP SOLN
1.0000 [drp] | OPHTHALMIC | Status: DC | PRN
  Administered 2017-09-29 (×3): 1 [drp] via OPHTHALMIC

## 2017-09-29 MED ORDER — FENTANYL CITRATE (PF) 100 MCG/2ML IJ SOLN
INTRAMUSCULAR | Status: DC | PRN
  Administered 2017-09-29: 50 ug via INTRAVENOUS

## 2017-09-29 MED ORDER — CEFUROXIME OPHTHALMIC INJECTION 1 MG/0.1 ML
INJECTION | OPHTHALMIC | Status: DC | PRN
  Administered 2017-09-29: 0.1 mL via OPHTHALMIC

## 2017-09-29 MED ORDER — LIDOCAINE HCL (PF) 2 % IJ SOLN
INTRAOCULAR | Status: DC | PRN
  Administered 2017-09-29: 1 mL

## 2017-09-29 MED ORDER — ARMC OPHTHALMIC DILATING DROPS
1.0000 "application " | OPHTHALMIC | Status: DC | PRN
  Administered 2017-09-29 (×3): 1 via OPHTHALMIC

## 2017-09-29 MED ORDER — NA HYALUR & NA CHOND-NA HYALUR 0.4-0.35 ML IO KIT
PACK | INTRAOCULAR | Status: DC | PRN
  Administered 2017-09-29: 1 mL via INTRAOCULAR

## 2017-09-29 SURGICAL SUPPLY — 27 items
CANNULA ANT/CHMB 27G (MISCELLANEOUS) ×1 IMPLANT
CANNULA ANT/CHMB 27GA (MISCELLANEOUS) ×3 IMPLANT
CARTRIDGE ABBOTT (MISCELLANEOUS) IMPLANT
GLOVE SURG LX 7.5 STRW (GLOVE) ×2
GLOVE SURG LX STRL 7.5 STRW (GLOVE) ×1 IMPLANT
GLOVE SURG TRIUMPH 8.0 PF LTX (GLOVE) ×3 IMPLANT
GOWN STRL REUS W/ TWL LRG LVL3 (GOWN DISPOSABLE) ×2 IMPLANT
GOWN STRL REUS W/TWL LRG LVL3 (GOWN DISPOSABLE) ×4
LENS IOL TECNIS ITEC 23.0 (Intraocular Lens) ×2 IMPLANT
MARKER SKIN DUAL TIP RULER LAB (MISCELLANEOUS) ×3 IMPLANT
NDL FILTER BLUNT 18X1 1/2 (NEEDLE) ×1 IMPLANT
NDL RETROBULBAR .5 NSTRL (NEEDLE) IMPLANT
NEEDLE FILTER BLUNT 18X 1/2SAF (NEEDLE) ×2
NEEDLE FILTER BLUNT 18X1 1/2 (NEEDLE) ×1 IMPLANT
PACK CATARACT BRASINGTON (MISCELLANEOUS) ×3 IMPLANT
PACK EYE AFTER SURG (MISCELLANEOUS) ×3 IMPLANT
PACK OPTHALMIC (MISCELLANEOUS) ×3 IMPLANT
RING MALYGIN 7.0 (MISCELLANEOUS) IMPLANT
SUT ETHILON 10-0 CS-B-6CS-B-6 (SUTURE)
SUT VICRYL  9 0 (SUTURE)
SUT VICRYL 9 0 (SUTURE) IMPLANT
SUTURE EHLN 10-0 CS-B-6CS-B-6 (SUTURE) IMPLANT
SYR 3ML LL SCALE MARK (SYRINGE) ×3 IMPLANT
SYR 5ML LL (SYRINGE) ×3 IMPLANT
SYR TB 1ML LUER SLIP (SYRINGE) ×3 IMPLANT
WATER STERILE IRR 500ML POUR (IV SOLUTION) ×3 IMPLANT
WIPE NON LINTING 3.25X3.25 (MISCELLANEOUS) ×3 IMPLANT

## 2017-09-29 NOTE — Anesthesia Preprocedure Evaluation (Signed)
Anesthesia Evaluation  Patient identified by MRN, date of birth, ID band Patient awake    Reviewed: Allergy & Precautions, H&P , NPO status , Patient's Chart, lab work & pertinent test results, reviewed documented beta blocker date and time   Airway Mallampati: II  TM Distance: >3 FB Neck ROM: full    Dental no notable dental hx.    Pulmonary neg pulmonary ROS,    Pulmonary exam normal breath sounds clear to auscultation       Cardiovascular Exercise Tolerance: Good hypertension,  Rhythm:regular Rate:Normal     Neuro/Psych negative neurological ROS  negative psych ROS   GI/Hepatic negative GI ROS, Neg liver ROS,   Endo/Other  diabetes, Type 2Hypothyroidism   Renal/GU CRF  negative genitourinary   Musculoskeletal   Abdominal   Peds  Hematology  (+) anemia ,   Anesthesia Other Findings   Reproductive/Obstetrics negative OB ROS                             Anesthesia Physical Anesthesia Plan  ASA: II  Anesthesia Plan: MAC   Post-op Pain Management:    Induction:   PONV Risk Score and Plan:   Airway Management Planned:   Additional Equipment:   Intra-op Plan:   Post-operative Plan:   Informed Consent: I have reviewed the patients History and Physical, chart, labs and discussed the procedure including the risks, benefits and alternatives for the proposed anesthesia with the patient or authorized representative who has indicated his/her understanding and acceptance.   Dental Advisory Given  Plan Discussed with: CRNA  Anesthesia Plan Comments:         Anesthesia Quick Evaluation

## 2017-09-29 NOTE — Op Note (Signed)
LOCATION:  [place]   PREOPERATIVE DIAGNOSIS:    Nuclear sclerotic cataract right eye. H25.11   POSTOPERATIVE DIAGNOSIS:  Nuclear sclerotic cataract right eye.     PROCEDURE:  Phacoemusification with posterior chamber intraocular lens placement of the right eye   LENS:   Implant Name Type Inv. Item Serial No. Manufacturer Lot No. LRB No. Used  LENS IOL DIOP 23.0 - [id number] Intraocular Lens LENS IOL DIOP 23.0 [id number]  Right 1        ULTRASOUND TIME: 21 % of 1 minutes, 13 seconds.  CDE 15.4   SURGEON:  Wyonia Hough, MD   ANESTHESIA:  Topical with tetracaine drops and 2% Xylocaine jelly, augmented with 1% preservative-free intracameral lidocaine.    COMPLICATIONS:  None.   DESCRIPTION OF PROCEDURE:  The patient was identified in the holding room and transported to the operating room and placed in the supine position under the operating microscope.  The right eye was identified as the operative eye and it was prepped and draped in the usual sterile ophthalmic fashion.   A 1 millimeter clear-corneal paracentesis was made at the 12:00 position.  0.5 ml of preservative-free 1% lidocaine was injected into the anterior chamber. The anterior chamber was filled with Viscoat viscoelastic.  A 2.4 millimeter keratome was used to make a near-clear corneal incision at the 9:00 position.  A curvilinear capsulorrhexis was made with a cystotome and capsulorrhexis forceps.  Balanced salt solution was used to hydrodissect and hydrodelineate the nucleus.   Phacoemulsification was then used in stop and chop fashion to remove the lens nucleus and epinucleus.  The remaining cortex was then removed using the irrigation and aspiration handpiece. Provisc was then placed into the capsular bag to distend it for lens placement.  A lens was then injected into the capsular bag.  The remaining viscoelastic was aspirated.   Wounds were hydrated with balanced salt solution.  The anterior  chamber was inflated to a physiologic pressure with balanced salt solution.  No wound leaks were noted. Cefuroxime 0.1 ml of a 10mg /ml solution was injected into the anterior chamber for a dose of 1 mg of intracameral antibiotic at the completion of the case.   Timolol and Brimonidine drops were applied to the eye.  The patient was taken to the recovery room in stable condition without complications of anesthesia or surgery.   Roshawnda Pecora 09/29/2017, 7:54 AM

## 2017-09-29 NOTE — H&P (Signed)
The History and Physical notes are on paper, have been signed, and are to be scanned. The patient remains stable and unchanged from the H&P.   Previous H&P reviewed, patient examined, and there are no changes.  Madeline Fox 09/29/2017 7:28 AM

## 2017-09-29 NOTE — Transfer of Care (Signed)
Immediate Anesthesia Transfer of Care Note  Patient: Madeline Fox  Procedure(s) Performed: CATARACT EXTRACTION PHACO AND INTRAOCULAR LENS PLACEMENT (IOC)  RIGHT DIABETIC (Right Eye)  Patient Location: PACU  Anesthesia Type: MAC  Level of Consciousness: awake, alert  and patient cooperative  Airway and Oxygen Therapy: Patient Spontanous Breathing and Patient connected to supplemental oxygen  Post-op Assessment: Post-op Vital signs reviewed, Patient's Cardiovascular Status Stable, Respiratory Function Stable, Patent Airway and No signs of Nausea or vomiting  Post-op Vital Signs: Reviewed and stable  Complications: No apparent anesthesia complications

## 2017-09-29 NOTE — Anesthesia Procedure Notes (Signed)
Procedure Name: [place] Performed by: Cameron Ali, CRNA Pre-anesthesia Checklist: Patient identified, Emergency Drugs available, Suction available, Timeout performed and Patient being monitored Patient Re-evaluated:Patient Re-evaluated prior to induction Oxygen Delivery Method: Nasal cannula Placement Confirmation: positive ETCO2

## 2017-09-29 NOTE — Anesthesia Postprocedure Evaluation (Signed)
Anesthesia Post Note  Patient: Madeline Fox  Procedure(s) Performed: CATARACT EXTRACTION PHACO AND INTRAOCULAR LENS PLACEMENT (IOC)  RIGHT DIABETIC (Right Eye)  Patient location during evaluation: PACU Anesthesia Type: MAC Level of consciousness: awake and alert Pain management: pain level controlled Vital Signs Assessment: post-procedure vital signs reviewed and stable Respiratory status: spontaneous breathing, nonlabored ventilation, respiratory function stable and patient connected to nasal cannula oxygen Cardiovascular status: stable and blood pressure returned to baseline Postop Assessment: no apparent nausea or vomiting Anesthetic complications: no    Alisa Graff

## 2017-09-30 ENCOUNTER — Encounter: Payer: Self-pay | Admitting: Ophthalmology

## 2017-10-28 ENCOUNTER — Ambulatory Visit: Payer: Medicare HMO | Admitting: Nutrition

## 2017-10-28 ENCOUNTER — Ambulatory Visit: Payer: Medicare HMO | Admitting: "Endocrinology

## 2017-11-04 LAB — COMPLETE METABOLIC PANEL WITH GFR
AG Ratio: 1.5 (calc) (ref 1.0–2.5)
ALBUMIN MSPROF: 4.3 g/dL (ref 3.6–5.1)
ALKALINE PHOSPHATASE (APISO): 67 U/L (ref 33–130)
ALT: 10 U/L (ref 6–29)
AST: 19 U/L (ref 10–35)
BILIRUBIN TOTAL: 0.5 mg/dL (ref 0.2–1.2)
BUN / CREAT RATIO: 19 (calc) (ref 6–22)
BUN: 20 mg/dL (ref 7–25)
CO2: 26 mmol/L (ref 20–32)
Calcium: 9.4 mg/dL (ref 8.6–10.4)
Chloride: 96 mmol/L — ABNORMAL LOW (ref 98–110)
Creat: 1.07 mg/dL — ABNORMAL HIGH (ref 0.60–0.93)
GFR, Est African American: 59 mL/min/{1.73_m2} — ABNORMAL LOW (ref >=60)
GFR, Est Non African American: 51 mL/min/{1.73_m2} — ABNORMAL LOW (ref >=60)
Globulin: 2.8 g/dL (calc) (ref 1.9–3.7)
Glucose, Bld: 131 mg/dL — ABNORMAL HIGH (ref 65–99)
Potassium: 5.1 mmol/L (ref 3.5–5.3)
SODIUM: 131 mmol/L — AB (ref 135–146)
Total Protein: 7.1 g/dL (ref 6.1–8.1)

## 2017-11-04 LAB — HEMOGLOBIN A1C
HEMOGLOBIN A1C: 6.7 %{Hb} — AB (ref <5.7)
Mean Plasma Glucose: 146 (calc)
eAG (mmol/L): 8.1 (calc)

## 2017-11-04 LAB — LIPID PANEL
CHOL/HDL RATIO: 2.8 (calc) (ref <5.0)
Cholesterol: 149 mg/dL (ref <200)
HDL: 53 mg/dL (ref >50)
LDL CHOLESTEROL (CALC): 79 mg/dL
NON-HDL CHOLESTEROL (CALC): 96 mg/dL (ref <130)
Triglycerides: 86 mg/dL (ref <150)

## 2017-11-04 LAB — TSH: TSH: 3.68 mIU/L (ref 0.40–4.50)

## 2017-11-04 LAB — T4, FREE: Free T4: 1.2 ng/dL (ref 0.8–1.8)

## 2017-11-10 ENCOUNTER — Encounter: Payer: Self-pay | Admitting: "Endocrinology

## 2017-11-10 ENCOUNTER — Ambulatory Visit (INDEPENDENT_AMBULATORY_CARE_PROVIDER_SITE_OTHER): Payer: Medicare HMO | Admitting: "Endocrinology

## 2017-11-10 ENCOUNTER — Encounter: Payer: Medicare HMO | Attending: Family Medicine | Admitting: Nutrition

## 2017-11-10 ENCOUNTER — Encounter: Payer: Self-pay | Admitting: Nutrition

## 2017-11-10 VITALS — BP 136/71 | HR 64 | Ht 63.0 in | Wt 161.0 lb

## 2017-11-10 VITALS — Ht 63.0 in | Wt 161.0 lb

## 2017-11-10 DIAGNOSIS — E782 Mixed hyperlipidemia: Secondary | ICD-10-CM

## 2017-11-10 DIAGNOSIS — E890 Postprocedural hypothyroidism: Secondary | ICD-10-CM | POA: Diagnosis not present

## 2017-11-10 DIAGNOSIS — N183 Chronic kidney disease, stage 3 unspecified: Secondary | ICD-10-CM

## 2017-11-10 DIAGNOSIS — E1122 Type 2 diabetes mellitus with diabetic chronic kidney disease: Secondary | ICD-10-CM

## 2017-11-10 DIAGNOSIS — I10 Essential (primary) hypertension: Secondary | ICD-10-CM

## 2017-11-10 DIAGNOSIS — E119 Type 2 diabetes mellitus without complications: Secondary | ICD-10-CM

## 2017-11-10 NOTE — Patient Instructions (Addendum)
Goals  Keep up the great job!! Increase walking Try prunes or prune juice for constipation Increase high fiber foods

## 2017-11-10 NOTE — Progress Notes (Signed)
Endocrinology follow-up note       11/10/2017, 4:03 PM   Subjective:    Patient ID: Madeline Fox, female    DOB: 08-20-1945.  Madeline Fox is being seen in follow-up  for management of currently controlled symptomatic type 2 diabetes, postsurgical hypothyroidism, hyperlipidemia. PMD:   Chiquita Loth, MD.   Past Medical History:  Diagnosis Date  . Anemia    iron deficiency anemia  . Anxiety   . Arthritis   . Breast cancer ([place]) 2007   LT LUMPECTOMY  . Cancer ([facility]) 2007   L BREAST CANCER - CHEMO/RADIATION /LUMPECTOMY    . Chronic kidney disease    stage 3  . Diabetes mellitus without complication (HCC)    Type 2  . Diverticulosis of colon with hemorrhage   . Goiter   . Heart murmur   . Hyperlipidemia   . Hypertension   . Hyperthyroidism   . Hypothyroidism    s/p thyroid ablation  . Internal hemorrhoids   . Neuromuscular disorder ([place])    NEUROPATHY IN TOES  . Nocturia   . Ovarian carcinoma in situ   . Pneumonia   . Psoriasis   . S/P chemotherapy, time since greater than 12 weeks 2007   BREAST CA  . S/P radiation > 12 weeks 2007   BREAST CA  . Thrombocytopenia ([place])   . Wears dentures    partial upper   Past Surgical History:  Procedure Laterality Date  . ABDOMINAL HYSTERECTOMY    . APPENDECTOMY    . BREAST BIOPSY Left 2007   POS  . BREAST LUMPECTOMY    . CATARACT EXTRACTION W/PHACO Right 09/29/2017   Procedure: CATARACT EXTRACTION PHACO AND INTRAOCULAR LENS PLACEMENT ([place])  RIGHT DIABETIC;  Surgeon: Leandrew Koyanagi, MD;  Location: [facility];  Service: Ophthalmology;  Laterality: Right;  Diabetic - oral meds  . CHOLECYSTECTOMY    . COLONOSCOPY WITH PROPOFOL N/A 04/12/2015   Procedure: COLONOSCOPY WITH PROPOFOL;  Surgeon: Hulen Luster, MD;  Location: [facility] ENDOSCOPY;  Service: Gastroenterology;  Laterality: N/A;  . FINE NEEDLE ASPIRATION Left    left upper lobe  thyroid - benign  . MASTECTOMY PARTIAL / LUMPECTOMY Left 2007   chemo and radiation  . OOPHORECTOMY     for carcinoma in situ  . THYROIDECTOMY  11/19/2011   Procedure: THYROIDECTOMY;  Surgeon: Earnstine Regal, MD;  Location: [facility];  Service: General;  Laterality: N/A;  Total Thyroidectomy  . THYROIDECTOMY     Social History   Socioeconomic History  . Marital status: Married    Spouse name: Not on file  . Number of children: Not on file  . Years of education: Not on file  . Highest education level: Not on file  Occupational History  . Not on file  Social Needs  . Financial resource strain: Not on file  . Food insecurity:    Worry: Not on file    Inability: Not on file  . Transportation needs:    Medical: Not on file    Non-medical: Not on file  Tobacco Use  . Smoking status: Never Smoker  . Smokeless tobacco:  Never Used  Substance and Sexual Activity  . Alcohol use: No  . Drug use: No  . Sexual activity: Not on file  Lifestyle  . Physical activity:    Days per week: Not on file    Minutes per session: Not on file  . Stress: Not on file  Relationships  . Social connections:    Talks on phone: Not on file    Gets together: Not on file    Attends religious service: Not on file    Active member of club or organization: Not on file    Attends meetings of clubs or organizations: Not on file    Relationship status: Not on file  Other Topics Concern  . Not on file  Social History Narrative  . Not on file   Outpatient Encounter Medications as of 11/10/2017  Medication Sig  . ACCU-CHEK FASTCLIX LANCETS MISC TEST BLOOD GLUCOSE TWICE DAILY  . ACCU-CHEK SMARTVIEW test strip 1 each by Other route 2 (two) times daily.  Marland Kitchen aspirin 81 MG tablet Take 81 mg by mouth every morning.   . blood glucose meter kit and supplies KIT Dispense based on patient and insurance preference. Use up to 2-3 times daily as directed. (FOR ICD-10 E11.65).  . cetirizine (ZYRTEC) 10 MG tablet Take 10 mg  by mouth at bedtime.  . Cholecalciferol (VITAMIN D3) 1000 units CAPS Take daily by mouth.  . diazepam (VALIUM) 5 MG tablet at bedtime.  . ferrous sulfate 325 (65 FE) MG tablet Take 325 mg by mouth 2 (two) times daily.  . hydrOXYzine (ATARAX/VISTARIL) 25 MG tablet 3 (three) times daily as needed.  Marland Kitchen levothyroxine (SYNTHROID, LEVOTHROID) 88 MCG tablet Take 88 mcg daily before breakfast by mouth. 1 tab Sun-Fri   1 1/2 tab on Sun  . lovastatin (MEVACOR) 40 MG tablet Take 40 mg by mouth at bedtime.   . Omega-3 Fatty Acids (FISH OIL) 1000 MG CAPS Take daily by mouth.  Marland Kitchen omeprazole (PRILOSEC) 40 MG capsule Take 40 mg daily by mouth.  . pioglitazone (ACTOS) 15 MG tablet Take 15 mg daily by mouth.  . sitaGLIPtin (JANUVIA) 100 MG tablet Take 100 mg by mouth daily.  . vitamin B-12 (CYANOCOBALAMIN) 1000 MCG tablet Take 1,000 mcg by mouth daily.   No facility-administered encounter medications on file as of 11/10/2017.     ALLERGIES: Allergies  Allergen Reactions  . Augmentin [Amoxicillin-Pot Clavulanate]     nervousness    VACCINATION STATUS: There is no immunization history for the selected administration types on file for this patient.  Diabetes  She presents for her follow-up diabetic visit. She has type 2 diabetes mellitus. Onset time: She was diagnosed at approximate age of 36 years. Denies any history of gestational diabetes. Her disease course has been stable. There are no hypoglycemic associated symptoms. Pertinent negatives for hypoglycemia include no confusion, headaches, pallor or seizures. There are no diabetic associated symptoms. Pertinent negatives for diabetes include no chest pain, no polydipsia, no polyphagia and no polyuria. There are no hypoglycemic complications. Symptoms are stable. Diabetic complications include nephropathy. Risk factors for coronary artery disease include diabetes mellitus, dyslipidemia, family history, hypertension, obesity, sedentary lifestyle and  post-menopausal. Her weight is fluctuating minimally. She is following a generally unhealthy diet. When asked about meal planning, she reported none. She has not had a previous visit with a dietitian. She never participates in exercise. An ACE inhibitor/angiotensin II receptor blocker is not being taken. She does not see a podiatrist.Eye exam is  current.  Hyperlipidemia  This is a chronic problem. The current episode started more than 1 year ago. Exacerbating diseases include chronic renal disease, diabetes, hypothyroidism and obesity. Pertinent negatives include no chest pain, myalgias or shortness of breath. Current antihyperlipidemic treatment includes statins. Risk factors for coronary artery disease include diabetes mellitus, dyslipidemia, family history, hypertension, obesity, a sedentary lifestyle and post-menopausal.  Hypertension  This is a chronic problem. The current episode started more than 1 year ago. The problem is uncontrolled. Pertinent negatives include no chest pain, headaches, palpitations or shortness of breath. Risk factors for coronary artery disease include diabetes mellitus, dyslipidemia, obesity and sedentary lifestyle. Identifiable causes of hypertension include chronic renal disease.    Review of Systems  Constitutional: Negative for chills, fever and unexpected weight change.  HENT: Negative for trouble swallowing and voice change.   Eyes: Negative for visual disturbance.  Respiratory: Negative for cough, shortness of breath and wheezing.   Cardiovascular: Negative for chest pain, palpitations and leg swelling.  Gastrointestinal: Negative for diarrhea, nausea and vomiting.  Endocrine: Negative for cold intolerance, heat intolerance, polydipsia, polyphagia and polyuria.  Musculoskeletal: Negative for arthralgias and myalgias.  Skin: Negative for color change, pallor, rash and wound.  Neurological: Negative for seizures and headaches.  Psychiatric/Behavioral: Negative for  confusion and suicidal ideas.    Objective:    BP 136/71   Pulse 64   Ht 5' 3"  (1.6 m)   Wt 161 lb (73 kg)   BMI 28.52 kg/m   Wt Readings from Last 3 Encounters:  11/10/17 161 lb (73 kg)  11/10/17 161 lb (73 kg)  09/29/17 157 lb (71.2 kg)     Physical Exam  Constitutional: She is oriented to person, place, and time. She appears well-developed.  HENT:  Head: Normocephalic and atraumatic.  Eyes: EOM are normal.  Neck: Normal range of motion. No tracheal deviation present. No thyromegaly present.  Anterior lower neck scar from prior thyroidectomy.  Cardiovascular: Normal rate and regular rhythm.  Pulmonary/Chest: Effort normal.  Abdominal: There is no tenderness. There is no guarding.  Musculoskeletal: Normal range of motion. She exhibits no edema.  Neurological: She is alert and oriented to person, place, and time. She has normal reflexes. No cranial nerve deficit. Coordination normal.  Skin: Skin is warm and dry. No rash noted. No erythema. No pallor.  Psychiatric: She has a normal mood and affect. Judgment normal.   Recent Results (from the past 2160 hour(s))  Glucose, capillary     Status: Abnormal   Collection Time: 09/29/17  6:37 AM  Result Value Ref Range   Glucose-Capillary 148 (H) 70 - 99 mg/dL  Glucose, capillary     Status: Abnormal   Collection Time: 09/29/17  7:57 AM  Result Value Ref Range   Glucose-Capillary 141 (H) 70 - 99 mg/dL  COMPLETE METABOLIC PANEL WITH GFR     Status: Abnormal   Collection Time: 11/03/17  3:12 PM  Result Value Ref Range   Glucose, Bld 131 (H) 65 - 99 mg/dL    Comment: .            Fasting reference interval . For someone without known diabetes, a glucose value >125 mg/dL indicates that they may have diabetes and this should be confirmed with a follow-up test. .    BUN 20 7 - 25 mg/dL   Creat 1.07 (H) 0.60 - 0.93 mg/dL    Comment: For patients >80 years of age, the reference limit for Creatinine is approximately 13%  higher  for people identified as African-American. .    GFR, Est Non African American 51 (L) > OR = 60 mL/min/1.45m   GFR, Est African American 59 (L) > OR = 60 mL/min/1.714m  BUN/Creatinine Ratio 19 6 - 22 (calc)   Sodium 131 (L) 135 - 146 mmol/L   Potassium 5.1 3.5 - 5.3 mmol/L   Chloride 96 (L) 98 - 110 mmol/L   CO2 26 20 - 32 mmol/L   Calcium 9.4 8.6 - 10.4 mg/dL   Total Protein 7.1 6.1 - 8.1 g/dL   Albumin 4.3 3.6 - 5.1 g/dL   Globulin 2.8 1.9 - 3.7 g/dL (calc)   AG Ratio 1.5 1.0 - 2.5 (calc)   Total Bilirubin 0.5 0.2 - 1.2 mg/dL   Alkaline phosphatase (APISO) 67 33 - 130 U/L   AST 19 10 - 35 U/L   ALT 10 6 - 29 U/L  Hemoglobin A1c     Status: Abnormal   Collection Time: 11/03/17  3:12 PM  Result Value Ref Range   Hgb A1c MFr Bld 6.7 (H) <5.7 % of total Hgb    Comment: For someone without known diabetes, a hemoglobin A1c value of 6.5% or greater indicates that they may have  diabetes and this should be confirmed with a follow-up  test. . For someone with known diabetes, a value <7% indicates  that their diabetes is well controlled and a value  greater than or equal to 7% indicates suboptimal  control. A1c targets should be individualized based on  duration of diabetes, age, comorbid conditions, and  other considerations. . Currently, no consensus exists regarding use of hemoglobin A1c for diagnosis of diabetes for children. .    Mean Plasma Glucose 146 (calc)   eAG (mmol/L) 8.1 (calc)  Lipid panel     Status: None   Collection Time: 11/03/17  3:12 PM  Result Value Ref Range   Cholesterol 149 <200 mg/dL   HDL 53 >50 mg/dL   Triglycerides 86 <150 mg/dL   LDL Cholesterol (Calc) 79 mg/dL (calc)    Comment: Reference range: <100 . Desirable range <100 mg/dL for primary prevention;   <70 mg/dL for patients with CHD or diabetic patients  with > or = 2 CHD risk factors. . Marland KitchenDL-C is now calculated using the Martin-Hopkins  calculation, which is a validated novel method  providing  better accuracy than the Friedewald equation in the  estimation of LDL-C.  MaCresenciano Genret al. JAAnnamaria [id number]  (http://education.[facility].com/faq/FAQ164)    Total CHOL/HDL Ratio 2.8 <5.0 (calc)   Non-HDL Cholesterol (Calc) 96 <130 mg/dL (calc)    Comment: For patients with diabetes plus 1 major ASCVD risk  factor, treating to a non-HDL-C goal of <100 mg/dL  (LDL-C of <70 mg/dL) is considered a therapeutic  option.   TSH     Status: None   Collection Time: 11/03/17  3:12 PM  Result Value Ref Range   TSH 3.68 0.40 - 4.50 mIU/L  T4, free     Status: None   Collection Time: 11/03/17  3:12 PM  Result Value Ref Range   Free T4 1.2 0.8 - 1.8 ng/dL   Lipid Panel     Component Value Date/Time   CHOL 149 11/03/2017 1512   TRIG 86 11/03/2017 1512   HDL 53 11/03/2017 1512   CHOLHDL 2.8 11/03/2017 1512   LDLCALC 79 11/03/2017 1512      Assessment & Plan:   1. Type 2 diabetes mellitus with  stage 3 chronic kidney disease, without long-term current use of insulin (HCC)  - Madeline Fox has currently uncontrolled symptomatic type 2 DM since  72 years of age. -She returns with stable A1c of 6.7%  -her diabetes is complicated by stage 3 renal insufficiency which is improving, obesity/sedentary life and Madeline Fox remains at a high risk for more acute and chronic complications which include CAD, CVA, CKD, retinopathy, and neuropathy. These are all discussed in detail with the patient.  - I have counseled her on diet management and weight loss, by adopting a carbohydrate restricted/protein rich diet.  -  Suggestion is made for her to avoid simple carbohydrates  from her diet including Cakes, Sweet Desserts / Pastries, Ice Cream, Soda (diet and regular), Sweet Tea, Candies, Chips, Cookies, Store Bought Juices, Alcohol in Excess of  1-2 drinks a day, Artificial Sweeteners, and "Sugar-free" Products. This will help patient to have stable blood glucose profile  and potentially avoid unintended weight gain.  - I encouraged her to switch to  unprocessed or minimally processed complex starch and increased protein intake (animal or plant source), fruits, and vegetables.  - she is advised to stick to a routine mealtimes to eat 3 meals  a day and avoid unnecessary snacks ( to snack only to correct hypoglycemia).   - I have approached her with the following individualized plan to manage diabetes and patient agrees:   - Based on presenting blood glucose profile between with improved A1c of 6.7%,  she will not require initiation of insulin treatment at this time. -She is advised to continue Januvia 100 mg p.o. daily with breakfast along with Actos 15 mg p.o. daily with breakfast as well.  Actos will be discontinued if she is initiated on basal insulin.   - Patient specific target  A1c;  LDL, HDL, Triglycerides, and  Waist Circumference were discussed in detail.  2) BP/HTN: Her blood pressure is controlled to target.  She is advised to continue her current blood pressure medications.   3) Lipids/HPL: Her recent lipid panel showed controlled LDL of 74.  She is advised to continue lovastatin 40 mg p.o. nightly.    4)  Weight/Diet: CDE Consult has been  initiated , exercise, and detailed carbohydrates information provided.  5) Postsurgical hypothyroidism - She underwent total thyroidectomy for large multinodular goiter in 2013.  -She is advised to continue levothyroxine at 88 mcg p.o. before breakfast (1 1/2 pills on Saturdays).   - We discussed about correct intake of levothyroxine, at fasting, with water, separated by at least 30 minutes from breakfast, and separated by more than 4 hours from calcium, iron, multivitamins, acid reflux medications (PPIs). -Patient is made aware of the fact that thyroid hormone replacement is needed for life, dose to be adjusted by periodic monitoring of thyroid function tests.  -Her complaint of neck tightness, she is  offered surveillance thyroid ultrasound which she will do anytime between now and her next visit.    6) Chronic Care/Health Maintenance:  -she  is on Statin medications and  is encouraged to continue to follow up with Ophthalmology, Dentist,  Podiatrist at least yearly or according to recommendations, and advised to   stay away from smoking. I have recommended yearly flu vaccine and pneumonia vaccination at least every 5 years; moderate intensity exercise for up to 150 minutes weekly; and  sleep for at least 7 hours a day.  - I advised patient to maintain close follow up with Madeline Mantis Delano Metz, MD for  primary care needs.  - Time spent with the patient: 25 min, of which >50% was spent in reviewing her  current and  previous labs, previous treatments, and medications doses and developing a plan for long-term care.  Madeline Fox participated in the discussions, expressed understanding, and voiced agreement with the above plans.  All questions were answered to her satisfaction. she is encouraged to contact clinic should she have any questions or concerns prior to her return visit.   Follow up plan: - Return in about 3 months (around 02/10/2018) for Follow up with Pre-visit Labs, Follow up with Thyroid Uptake and Scan.  Glade Lloyd, MD [facility] [address] [place], [place] [address] Phone: [phone]  Fax: [phone]    11/10/2017, 4:03 PM  This note was partially dictated with voice recognition software. Similar sounding words can be transcribed inadequately or may not  be corrected upon review.

## 2017-11-10 NOTE — Progress Notes (Signed)
  Medical Nutrition Therapy:  Appt start time: 1500 end time:  1530 Assessment:  Primary concerns today: Diabetes Type 2. To see  DR. Nida, Endocrinology today.   Changes: Has been using the  Floor stepper for exercise. Has been walking in the woods some. Has dentures but doesn't use them. Eating well balanced meals.  Says she feels her throat gets tight sometimes and gets anxious. Had thryoid taken out 5 yrs ago.  Denies issues with swallowing. A1C 6.7%. On Actos and Januvia. Wt up 2 lbs. Doing very well.   Lab Results  Component Value Date   HGBA1C 6.7 (H) 11/03/2017   CMP Latest Ref Rng & Units 11/03/2017 06/22/2017 03/10/2017  Glucose 65 - 99 mg/dL 131(H) 149(H) 193(H)  BUN 7 - 25 mg/dL 20 17 17   Creatinine 0.60 - 0.93 mg/dL 1.07(H) 1.16(H) 1.32(H)  Sodium 135 - 146 mmol/L 131(L) 132(L) 134(L)  Potassium 3.5 - 5.3 mmol/L 5.1 4.8 4.9  Chloride 98 - 110 mmol/L 96(L) 97(L) 101  CO2 20 - 32 mmol/L 26 30 26   Calcium 8.6 - 10.4 mg/dL 9.4 9.5 9.3  Total Protein 6.1 - 8.1 g/dL 7.1 7.1 7.5  Total Bilirubin 0.2 - 1.2 mg/dL 0.5 0.4 0.5  Alkaline Phos Unit/L - - -  AST 10 - 35 U/L 19 16 16   ALT 6 - 29 U/L 10 8 8      Preferred Learning Style:    No preference indicated   Learning Readiness:     Ready  Change in progress   MEDICATIONS:  See list.   DIETARY INTAKE:  B) 1 pancake, fruit and egg or oatmeal, fruit and egg, L) Ham sandwich, tomato, green beans, water and yogurt, water D) pork tenderloin, green beans, broccoli, cauliflower, water Drinks water only.   Usual physical activity: ADL walks some.  Estimated energy needs: 1500  calories 170 g carbohydrates 112 g protein 42 g fat  Progress Towards Goal(s):  In progress.   Nutritional Diagnosis:  NB-1.1 Food and nutrition-related knowledge deficit As related to Diabetes Type 2.  As evidenced by A1C 7.7%.    Intervention:  Nutrition and Diabetes education provided on My Plate, CHO counting, meal planning,  portion sizes, timing of meals, avoiding snacks between meals unless having a low blood sugar, target ranges for A1C and blood sugars, signs/symptoms and treatment of hyper/hypoglycemia, monitoring blood sugars, taking medications as prescribed, benefits of exercising 30 minutes per day and prevention of complications of DM. Marland KitchenGoals  Keep up the great job!! Increase walking Try prunes or prune juice for constipation Increase high fiber foods   Teaching Method Utilized:  Visual Auditory Hands on  Handouts given during visit include:  The Plate Method   Meal Plan Card   Barriers to learning/adherence to lifestyle change: none  Demonstrated degree of understanding via:  Teach Back   Monitoring/Evaluation:  Dietary intake, exercise, meal planning, SBG, and body weight in 3 month(s).

## 2017-11-11 ENCOUNTER — Other Ambulatory Visit: Payer: Self-pay

## 2017-11-11 MED ORDER — SITAGLIPTIN PHOSPHATE 100 MG PO TABS: 100.0000 mg | ORAL_TABLET | Freq: Every day | ORAL | 1 refills | Status: DC

## 2017-12-06 ENCOUNTER — Ambulatory Visit (HOSPITAL_COMMUNITY)
Admission: RE | Admit: 2017-12-06 | Discharge: 2017-12-06 | Disposition: A | Discharge status: Home / Self Care | Payer: Medicare HMO | Source: Ambulatory Visit | Attending: "Endocrinology | Admitting: "Endocrinology

## 2017-12-06 DIAGNOSIS — E890 Postprocedural hypothyroidism: Secondary | ICD-10-CM | POA: Insufficient documentation

## 2018-02-02 NOTE — Anesthesia Preprocedure Evaluation (Addendum)
Anesthesia Evaluation  Patient identified by MRN, date of birth, ID band Patient awake    Reviewed: Allergy & Precautions, NPO status , Patient's Chart, lab work & pertinent test results  History of Anesthesia Complications Negative for: history of anesthetic complications  Airway Mallampati: III   Neck ROM: Full    Dental  (+) Edentulous Upper, Edentulous Lower   Pulmonary neg pulmonary ROS,    Pulmonary exam normal breath sounds clear to auscultation       Cardiovascular hypertension, + Valvular Problems/Murmurs (murmur)  Rhythm:Regular Rate:Normal + Systolic murmurs    Neuro/Psych PSYCHIATRIC DISORDERS Anxiety negative neurological ROS     GI/Hepatic GERD  ,  Endo/Other  diabetes, Type 2Hypothyroidism   Renal/GU Renal disease (stage III CKD)     Musculoskeletal  (+) Arthritis ,   Abdominal   Peds  Hematology  (+) Blood dyscrasia, anemia , Breast CA   Anesthesia Other Findings   Reproductive/Obstetrics                            Anesthesia Physical Anesthesia Plan  ASA: II  Anesthesia Plan: MAC   Post-op Pain Management:    Induction: Intravenous  PONV Risk Score and Plan: 2 and TIVA and Midazolam  Airway Management Planned: Natural Airway  Additional Equipment:   Intra-op Plan:   Post-operative Plan:   Informed Consent: I have reviewed the patients History and Physical, chart, labs and discussed the procedure including the risks, benefits and alternatives for the proposed anesthesia with the patient or authorized representative who has indicated his/her understanding and acceptance.       Plan Discussed with: CRNA  Anesthesia Plan Comments:        Anesthesia Quick Evaluation

## 2018-02-03 NOTE — Discharge Instructions (Signed)

## 2018-02-04 ENCOUNTER — Other Ambulatory Visit: Payer: Self-pay | Admitting: Family Medicine

## 2018-02-04 DIAGNOSIS — Z1231 Encounter for screening mammogram for malignant neoplasm of breast: Secondary | ICD-10-CM

## 2018-02-09 ENCOUNTER — Ambulatory Visit: Payer: Medicare HMO | Admitting: Anesthesiology

## 2018-02-09 ENCOUNTER — Ambulatory Visit
Admission: RE | Admit: 2018-02-09 | Discharge: 2018-02-09 | Disposition: A | Discharge status: Home / Self Care | Payer: Medicare HMO | Attending: Ophthalmology | Admitting: Ophthalmology

## 2018-02-09 ENCOUNTER — Encounter
Admission: RE | Disposition: A | Discharge status: Home / Self Care | Payer: Self-pay | Source: Home / Self Care | Attending: Ophthalmology

## 2018-02-09 DIAGNOSIS — E1122 Type 2 diabetes mellitus with diabetic chronic kidney disease: Secondary | ICD-10-CM | POA: Diagnosis not present

## 2018-02-09 DIAGNOSIS — K219 Gastro-esophageal reflux disease without esophagitis: Secondary | ICD-10-CM | POA: Insufficient documentation

## 2018-02-09 DIAGNOSIS — F419 Anxiety disorder, unspecified: Secondary | ICD-10-CM | POA: Diagnosis not present

## 2018-02-09 DIAGNOSIS — Z79890 Hormone replacement therapy (postmenopausal): Secondary | ICD-10-CM | POA: Diagnosis not present

## 2018-02-09 DIAGNOSIS — I129 Hypertensive chronic kidney disease with stage 1 through stage 4 chronic kidney disease, or unspecified chronic kidney disease: Secondary | ICD-10-CM | POA: Insufficient documentation

## 2018-02-09 DIAGNOSIS — Z79899 Other long term (current) drug therapy: Secondary | ICD-10-CM | POA: Diagnosis not present

## 2018-02-09 DIAGNOSIS — E039 Hypothyroidism, unspecified: Secondary | ICD-10-CM | POA: Diagnosis not present

## 2018-02-09 DIAGNOSIS — Z7984 Long term (current) use of oral hypoglycemic drugs: Secondary | ICD-10-CM | POA: Diagnosis not present

## 2018-02-09 DIAGNOSIS — Z7982 Long term (current) use of aspirin: Secondary | ICD-10-CM | POA: Diagnosis not present

## 2018-02-09 DIAGNOSIS — D631 Anemia in chronic kidney disease: Secondary | ICD-10-CM | POA: Insufficient documentation

## 2018-02-09 DIAGNOSIS — N183 Chronic kidney disease, stage 3 (moderate): Secondary | ICD-10-CM | POA: Diagnosis not present

## 2018-02-09 DIAGNOSIS — H2512 Age-related nuclear cataract, left eye: Secondary | ICD-10-CM | POA: Diagnosis not present

## 2018-02-09 DIAGNOSIS — E7800 Pure hypercholesterolemia, unspecified: Secondary | ICD-10-CM | POA: Diagnosis not present

## 2018-02-09 DIAGNOSIS — E1136 Type 2 diabetes mellitus with diabetic cataract: Secondary | ICD-10-CM | POA: Insufficient documentation

## 2018-02-09 HISTORY — PX: CATARACT EXTRACTION W/PHACO: SHX586

## 2018-02-09 LAB — HEMOGLOBIN A1C
EAG (MMOL/L): 8.2 (calc)
Hgb A1c MFr Bld: 6.8 % of total Hgb — ABNORMAL HIGH (ref <5.7)
Mean Plasma Glucose: 148 (calc)

## 2018-02-09 LAB — COMPLETE METABOLIC PANEL WITH GFR
AG Ratio: 1.3 (calc) (ref 1.0–2.5)
ALT: 8 U/L (ref 6–29)
AST: 18 U/L (ref 10–35)
Albumin: 4 g/dL (ref 3.6–5.1)
Alkaline phosphatase (APISO): 69 U/L (ref 33–130)
BUN / CREAT RATIO: 16 (calc) (ref 6–22)
BUN: 20 mg/dL (ref 7–25)
CALCIUM: 9.4 mg/dL (ref 8.6–10.4)
CHLORIDE: 99 mmol/L (ref 98–110)
CO2: 25 mmol/L (ref 20–32)
Creat: 1.24 mg/dL — ABNORMAL HIGH (ref 0.60–0.93)
GFR, EST AFRICAN AMERICAN: 50 mL/min/{1.73_m2} — AB (ref >=60)
GFR, EST NON AFRICAN AMERICAN: 43 mL/min/{1.73_m2} — AB (ref >=60)
Globulin: 3 g/dL (calc) (ref 1.9–3.7)
Glucose, Bld: 146 mg/dL — ABNORMAL HIGH (ref 65–99)
POTASSIUM: 5.1 mmol/L (ref 3.5–5.3)
Sodium: 135 mmol/L (ref 135–146)
TOTAL PROTEIN: 7 g/dL (ref 6.1–8.1)
Total Bilirubin: 0.5 mg/dL (ref 0.2–1.2)

## 2018-02-09 LAB — TSH: TSH: 2.86 mIU/L (ref 0.40–4.50)

## 2018-02-09 LAB — GLUCOSE, CAPILLARY
Glucose-Capillary: 125 mg/dL — ABNORMAL HIGH (ref 70–99)
Glucose-Capillary: 113 mg/dL — ABNORMAL HIGH (ref 70–99)

## 2018-02-09 LAB — T4, FREE: Free T4: 1.2 ng/dL (ref 0.8–1.8)

## 2018-02-09 SURGERY — PHACOEMULSIFICATION, CATARACT, WITH IOL INSERTION
Anesthesia: Monitor Anesthesia Care | Site: Eye | Laterality: Left

## 2018-02-09 MED ORDER — NA HYALUR & NA CHOND-NA HYALUR 0.4-0.35 ML IO KIT
PACK | INTRAOCULAR | Status: DC | PRN
  Administered 2018-02-09: 1 mL via INTRAOCULAR

## 2018-02-09 MED ORDER — TETRACAINE HCL 0.5 % OP SOLN
1.0000 [drp] | OPHTHALMIC | Status: DC | PRN
  Administered 2018-02-09 (×2): 1 [drp] via OPHTHALMIC

## 2018-02-09 MED ORDER — ACETAMINOPHEN 325 MG PO TABS: 650.0000 mg | ORAL_TABLET | Freq: Once | ORAL | Status: DC | PRN

## 2018-02-09 MED ORDER — ARMC OPHTHALMIC DILATING DROPS
1.0000 "application " | OPHTHALMIC | Status: DC | PRN
  Administered 2018-02-09 (×3): 1 via OPHTHALMIC

## 2018-02-09 MED ORDER — CEFUROXIME OPHTHALMIC INJECTION 1 MG/0.1 ML
INJECTION | OPHTHALMIC | Status: DC | PRN
  Administered 2018-02-09: 0.1 mL via INTRACAMERAL

## 2018-02-09 MED ORDER — LACTATED RINGERS IV SOLN: INTRAVENOUS | Status: DC

## 2018-02-09 MED ORDER — MOXIFLOXACIN HCL 0.5 % OP SOLN
1.0000 [drp] | OPHTHALMIC | Status: DC | PRN
  Administered 2018-02-09 (×3): 1 [drp] via OPHTHALMIC

## 2018-02-09 MED ORDER — FENTANYL CITRATE (PF) 100 MCG/2ML IJ SOLN
INTRAMUSCULAR | Status: DC | PRN
  Administered 2018-02-09: 50 ug via INTRAVENOUS

## 2018-02-09 MED ORDER — EPINEPHRINE PF 1 MG/ML IJ SOLN
INTRAOCULAR | Status: DC | PRN
  Administered 2018-02-09: 94 mL via OPHTHALMIC

## 2018-02-09 MED ORDER — ACETAMINOPHEN 160 MG/5ML PO SOLN: 325.0000 mg | ORAL | Status: DC | PRN

## 2018-02-09 MED ORDER — LIDOCAINE HCL (PF) 2 % IJ SOLN
INTRAOCULAR | Status: DC | PRN
  Administered 2018-02-09: 2 mL

## 2018-02-09 MED ORDER — MIDAZOLAM HCL 2 MG/2ML IJ SOLN
INTRAMUSCULAR | Status: DC | PRN
  Administered 2018-02-09: 1 mg via INTRAVENOUS

## 2018-02-09 MED ORDER — ONDANSETRON HCL 4 MG/2ML IJ SOLN: 4.0000 mg | Freq: Once | INTRAMUSCULAR | Status: DC | PRN

## 2018-02-09 MED ORDER — BRIMONIDINE TARTRATE-TIMOLOL 0.2-0.5 % OP SOLN
OPHTHALMIC | Status: DC | PRN
  Administered 2018-02-09: 1 [drp] via OPHTHALMIC

## 2018-02-09 SURGICAL SUPPLY — 20 items
CANNULA ANT/CHMB 27G (MISCELLANEOUS) ×1 IMPLANT
CANNULA ANT/CHMB 27GA (MISCELLANEOUS) ×3 IMPLANT
GLOVE SURG LX 7.5 STRW (GLOVE) ×2
GLOVE SURG LX STRL 7.5 STRW (GLOVE) ×1 IMPLANT
GLOVE SURG TRIUMPH 8.0 PF LTX (GLOVE) ×3 IMPLANT
GOWN STRL REUS W/ TWL LRG LVL3 (GOWN DISPOSABLE) ×2 IMPLANT
GOWN STRL REUS W/TWL LRG LVL3 (GOWN DISPOSABLE) ×4
LENS IOL TECNIS ITEC 22.0 (Intraocular Lens) ×2 IMPLANT
MARKER SKIN DUAL TIP RULER LAB (MISCELLANEOUS) ×3 IMPLANT
NDL FILTER BLUNT 18X1 1/2 (NEEDLE) ×1 IMPLANT
NEEDLE FILTER BLUNT 18X 1/2SAF (NEEDLE) ×2
NEEDLE FILTER BLUNT 18X1 1/2 (NEEDLE) ×1 IMPLANT
PACK CATARACT BRASINGTON (MISCELLANEOUS) ×3 IMPLANT
PACK EYE AFTER SURG (MISCELLANEOUS) ×3 IMPLANT
PACK OPTHALMIC (MISCELLANEOUS) ×3 IMPLANT
SYR 3ML LL SCALE MARK (SYRINGE) ×3 IMPLANT
SYR 5ML LL (SYRINGE) ×3 IMPLANT
SYR TB 1ML LUER SLIP (SYRINGE) ×3 IMPLANT
WATER STERILE IRR 500ML POUR (IV SOLUTION) ×3 IMPLANT
WIPE NON LINTING 3.25X3.25 (MISCELLANEOUS) ×3 IMPLANT

## 2018-02-09 NOTE — H&P (Signed)

## 2018-02-09 NOTE — Anesthesia Postprocedure Evaluation (Signed)
Anesthesia Post Note  Patient: Madeline Fox  Procedure(s) Performed: CATARACT EXTRACTION PHACO AND INTRAOCULAR LENS PLACEMENT ([facility]) LEFT DIABETIC (Left Eye)  Patient location during evaluation: PACU Anesthesia Type: MAC Level of consciousness: awake and alert, oriented and patient cooperative Pain management: pain level controlled Vital Signs Assessment: post-procedure vital signs reviewed and stable Respiratory status: spontaneous breathing, nonlabored ventilation and respiratory function stable Cardiovascular status: blood pressure returned to baseline and stable Postop Assessment: adequate PO intake Anesthetic complications: no    Darrin Nipper

## 2018-02-09 NOTE — Transfer of Care (Signed)
Immediate Anesthesia Transfer of Care Note  Patient: Madeline Fox  Procedure(s) Performed: CATARACT EXTRACTION PHACO AND INTRAOCULAR LENS PLACEMENT ([facility]) LEFT DIABETIC (Left Eye)  Patient Location: PACU  Anesthesia Type: MAC  Level of Consciousness: awake, alert  and patient cooperative  Airway and Oxygen Therapy: Patient Spontanous Breathing and Patient connected to supplemental oxygen  Post-op Assessment: Post-op Vital signs reviewed, Patient's Cardiovascular Status Stable, Respiratory Function Stable, Patent Airway and No signs of Nausea or vomiting  Post-op Vital Signs: Reviewed and stable  Complications: No apparent anesthesia complications

## 2018-02-09 NOTE — Op Note (Signed)
OPERATIVE NOTE  Madeline Fox [id number] 02/09/2018   PREOPERATIVE DIAGNOSIS:  Nuclear sclerotic cataract left eye. H25.12   POSTOPERATIVE DIAGNOSIS:    Nuclear sclerotic cataract left eye.     PROCEDURE:  Phacoemusification with posterior chamber intraocular lens placement of the left eye   LENS:   Implant Name Type Inv. Item Serial No. Manufacturer Lot No. LRB No. Used  LENS IOL DIOP 22.0 - [id number] Intraocular Lens LENS IOL DIOP 22.0 [id number] AMO  Left 1        ULTRASOUND TIME: 23  % of 1 minutes 45 seconds, CDE 24.3  SURGEON:  Wyonia Hough, MD   ANESTHESIA:  Topical with tetracaine drops and 2% Xylocaine jelly, augmented with 1% preservative-free intracameral lidocaine.    COMPLICATIONS:  None.   DESCRIPTION OF PROCEDURE:  The patient was identified in the holding room and transported to the operating room and placed in the supine position under the operating microscope.  The left eye was identified as the operative eye and it was prepped and draped in the usual sterile ophthalmic fashion.   A 1 millimeter clear-corneal paracentesis was made at the 1:30 position.  0.5 ml of preservative-free 1% lidocaine was injected into the anterior chamber.  The anterior chamber was filled with Viscoat viscoelastic.  A 2.4 millimeter keratome was used to make a near-clear corneal incision at the 10:30 position.  .  A curvilinear capsulorrhexis was made with a cystotome and capsulorrhexis forceps.  Balanced salt solution was used to hydrodissect and hydrodelineate the nucleus.   Phacoemulsification was then used in stop and chop fashion to remove the lens nucleus and epinucleus.  The remaining cortex was then removed using the irrigation and aspiration handpiece. Provisc was then placed into the capsular bag to distend it for lens placement.  A lens was then injected into the capsular bag.  The remaining viscoelastic was aspirated.   Wounds were hydrated with balanced salt  solution.  The anterior chamber was inflated to a physiologic pressure with balanced salt solution.  No wound leaks were noted. Cefuroxime 0.1 ml of a 10mg /ml solution was injected into the anterior chamber for a dose of 1 mg of intracameral antibiotic at the completion of the case.   Timolol and Brimonidine drops were applied to the eye.  The patient was taken to the recovery room in stable condition without complications of anesthesia or surgery.  [name] 02/09/2018, 8:00 AM

## 2018-02-10 ENCOUNTER — Encounter: Payer: Self-pay | Admitting: Ophthalmology

## 2018-02-15 ENCOUNTER — Encounter: Payer: Self-pay | Admitting: Nutrition

## 2018-02-15 ENCOUNTER — Ambulatory Visit (INDEPENDENT_AMBULATORY_CARE_PROVIDER_SITE_OTHER): Payer: Medicare HMO | Admitting: "Endocrinology

## 2018-02-15 ENCOUNTER — Encounter: Payer: Medicare HMO | Attending: Family Medicine | Admitting: Nutrition

## 2018-02-15 ENCOUNTER — Encounter: Payer: Self-pay | Admitting: "Endocrinology

## 2018-02-15 VITALS — Ht 63.0 in | Wt 162.0 lb

## 2018-02-15 VITALS — BP 136/77 | HR 64 | Ht 63.0 in | Wt 162.0 lb

## 2018-02-15 DIAGNOSIS — E1165 Type 2 diabetes mellitus with hyperglycemia: Secondary | ICD-10-CM | POA: Diagnosis present

## 2018-02-15 DIAGNOSIS — I10 Essential (primary) hypertension: Secondary | ICD-10-CM | POA: Diagnosis not present

## 2018-02-15 DIAGNOSIS — E782 Mixed hyperlipidemia: Secondary | ICD-10-CM

## 2018-02-15 DIAGNOSIS — E890 Postprocedural hypothyroidism: Secondary | ICD-10-CM | POA: Diagnosis not present

## 2018-02-15 DIAGNOSIS — IMO0002 Reserved for concepts with insufficient information to code with codable children: Secondary | ICD-10-CM

## 2018-02-15 DIAGNOSIS — E118 Type 2 diabetes mellitus with unspecified complications: Secondary | ICD-10-CM | POA: Diagnosis not present

## 2018-02-15 DIAGNOSIS — E1122 Type 2 diabetes mellitus with diabetic chronic kidney disease: Secondary | ICD-10-CM

## 2018-02-15 DIAGNOSIS — N183 Chronic kidney disease, stage 3 (moderate): Secondary | ICD-10-CM

## 2018-02-15 NOTE — Progress Notes (Signed)
  Medical Nutrition Therapy:  Appt start time: 1430  end time:  1500 Assessment:  Primary concerns today: Diabetes Type 2. To see  DR. Nida, Endocrinology today.    A1C 6.8%. Had cataract surgery on left last week and the right one in October 2019. Hasn't been able of stepper  Changes:  Still working on portion sizes and more fresh fruits and vegetables. Has dentures but doesn't use them. Eating well balanced meals.  Says she feels her throat gets tight sometimes and gets anxious. Had thryoid taken out 5 yrs ago.  Denies issues with swallowing. On Actos and Januvia. Doing very well.   Lab Results  Component Value Date   HGBA1C 6.8 (H) 02/08/2018   CMP Latest Ref Rng & Units 02/08/2018 11/03/2017 06/22/2017  Glucose 65 - 99 mg/dL 146(H) 131(H) 149(H)  BUN 7 - 25 mg/dL 20 20 17   Creatinine 0.60 - 0.93 mg/dL 1.24(H) 1.07(H) 1.16(H)  Sodium 135 - 146 mmol/L 135 131(L) 132(L)  Potassium 3.5 - 5.3 mmol/L 5.1 5.1 4.8  Chloride 98 - 110 mmol/L 99 96(L) 97(L)  CO2 20 - 32 mmol/L 25 26 30   Calcium 8.6 - 10.4 mg/dL 9.4 9.4 9.5  Total Protein 6.1 - 8.1 g/dL 7.0 7.1 7.1  Total Bilirubin 0.2 - 1.2 mg/dL 0.5 0.5 0.4  Alkaline Phos Unit/L - - -  AST 10 - 35 U/L 18 19 16   ALT 6 - 29 U/L 8 10 8      Preferred Learning Style:    No preference indicated   Learning Readiness:     Ready  Change in progress   MEDICATIONS:  See list.   DIETARY INTAKE:  B) 1 pancake, fruit and egg or oatmeal, fruit and egg, L) Ham sandwich, tomato, green beans, water and yogurt, water D) pork tenderloin, green beans, broccoli, cauliflower, water Drinks water only.   Usual physical activity: ADL walks some.  Estimated energy needs: 1500  calories 170 g carbohydrates 112 g protein 42 g fat  Progress Towards Goal(s):  In progress.   Nutritional Diagnosis:  NB-1.1 Food and nutrition-related knowledge deficit As related to Diabetes Type 2.  As evidenced by A1C 7.7%.    Intervention:  Nutrition and  Diabetes education provided on My Plate, CHO counting, meal planning, portion sizes, timing of meals, avoiding snacks between meals unless having a low blood sugar, target ranges for A1C and blood sugars, signs/symptoms and treatment of hyper/hypoglycemia, monitoring blood sugars, taking medications as prescribed, benefits of exercising 30 minutes per day and prevention of complications of DM. Marland KitchenGoals  Keep up the great job!! Goals  Increase water intake to 6 bottles per day  COntinue to increase high fiber food Increase walking Try prunes or prune juice for constipation Increase high fiber foods   Goals  Drink more water intake  Teaching Method Utilized:  Visual Auditory Hands on  Handouts given during visit include:  The Plate Method   Meal Plan Card   Barriers to learning/adherence to lifestyle change: none  Demonstrated degree of understanding via:  Teach Back   Monitoring/Evaluation:  Dietary intake, exercise, meal planning, SBG, and body weight in 6 month(s).

## 2018-02-15 NOTE — Progress Notes (Signed)
Endocrinology follow-up note       02/15/2018, 3:20 PM   Subjective:    Patient ID: Madeline Fox, female    DOB: 26-Oct-1945.  Madeline Fox is being seen in follow-up  for management of currently controlled symptomatic type 2 diabetes, postsurgical hypothyroidism, hyperlipidemia. PMD:   Zhou-Talbert, Elwyn Lade, MD.   Past Medical History:  Diagnosis Date  . Anemia    iron deficiency anemia  . Anxiety   . Arthritis   . Breast cancer ([place]) 2007   LT LUMPECTOMY  . Cancer ([place]) 2007   L BREAST CANCER - CHEMO/RADIATION /LUMPECTOMY    . Chronic kidney disease    stage 3  . Diabetes mellitus without complication (HCC)    Type 2  . Diverticulosis of colon with hemorrhage   . Goiter   . Heart murmur   . Hyperlipidemia   . Hypertension   . Hyperthyroidism   . Hypothyroidism    s/p thyroid ablation  . Internal hemorrhoids   . Neuromuscular disorder ([place])    NEUROPATHY IN TOES  . Nocturia   . Ovarian carcinoma in situ   . Pneumonia   . Psoriasis   . S/P chemotherapy, time since greater than 12 weeks 2007   BREAST CA  . S/P radiation > 12 weeks 2007   BREAST CA  . Thrombocytopenia ([place])   . Wears dentures    partial upper   Past Surgical History:  Procedure Laterality Date  . ABDOMINAL HYSTERECTOMY    . APPENDECTOMY    . BREAST BIOPSY Left 2007   POS  . BREAST LUMPECTOMY    . CATARACT EXTRACTION W/PHACO Right 09/29/2017   Procedure: CATARACT EXTRACTION PHACO AND INTRAOCULAR LENS PLACEMENT ([place])  RIGHT DIABETIC;  Surgeon: Leandrew Koyanagi, MD;  Location: [facility];  Service: Ophthalmology;  Laterality: Right;  Diabetic - oral meds  . CATARACT EXTRACTION W/PHACO Left 02/09/2018   Procedure: CATARACT EXTRACTION PHACO AND INTRAOCULAR LENS PLACEMENT ([place]) LEFT DIABETIC;  Surgeon: Leandrew Koyanagi, MD;  Location: [facility];  Service: Ophthalmology;  Laterality: Left;  .  CHOLECYSTECTOMY    . COLONOSCOPY WITH PROPOFOL N/A 04/12/2015   Procedure: COLONOSCOPY WITH PROPOFOL;  Surgeon: Hulen Luster, MD;  Location: [facility] ENDOSCOPY;  Service: Gastroenterology;  Laterality: N/A;  . FINE NEEDLE ASPIRATION Left    left upper lobe thyroid - benign  . MASTECTOMY PARTIAL / LUMPECTOMY Left 2007   chemo and radiation  . OOPHORECTOMY     for carcinoma in situ  . THYROIDECTOMY  11/19/2011   Procedure: THYROIDECTOMY;  Surgeon: Earnstine Regal, MD;  Location: [facility];  Service: General;  Laterality: N/A;  Total Thyroidectomy  . THYROIDECTOMY     Social History   Socioeconomic History  . Marital status: Married    Spouse name: Not on file  . Number of children: Not on file  . Years of education: Not on file  . Highest education level: Not on file  Occupational History  . Not on file  Social Needs  . Financial resource strain: Not on file  . Food insecurity:    Worry: Not on file  Inability: Not on file  . Transportation needs:    Medical: Not on file    Non-medical: Not on file  Tobacco Use  . Smoking status: Never Smoker  . Smokeless tobacco: Never Used  Substance and Sexual Activity  . Alcohol use: No  . Drug use: No  . Sexual activity: Not on file  Lifestyle  . Physical activity:    Days per week: Not on file    Minutes per session: Not on file  . Stress: Not on file  Relationships  . Social connections:    Talks on phone: Not on file    Gets together: Not on file    Attends religious service: Not on file    Active member of club or organization: Not on file    Attends meetings of clubs or organizations: Not on file    Relationship status: Not on file  Other Topics Concern  . Not on file  Social History Narrative  . Not on file   Outpatient Encounter Medications as of 02/15/2018  Medication Sig  . ACCU-CHEK FASTCLIX LANCETS MISC TEST BLOOD GLUCOSE TWICE DAILY  . ACCU-CHEK SMARTVIEW test strip 1 each by Other route 2 (two) times daily.  Marland Kitchen  ALPRAZolam (NIRAVAM) 0.25 MG dissolvable tablet Take 0.25 mg by mouth every morning.   Marland Kitchen aspirin 81 MG tablet Take 81 mg by mouth every morning.   . blood glucose meter kit and supplies KIT Dispense based on patient and insurance preference. Use up to 2-3 times daily as directed. (FOR ICD-10 E11.65).  . cetirizine (ZYRTEC) 10 MG tablet Take 10 mg by mouth at bedtime.  . Cholecalciferol (VITAMIN D3) 1000 units CAPS Take daily by mouth.  . diazepam (VALIUM) 5 MG tablet at bedtime.  . ferrous sulfate 325 (65 FE) MG tablet Take 325 mg by mouth 2 (two) times daily.  . hydrOXYzine (ATARAX/VISTARIL) 25 MG tablet 3 (three) times daily as needed.  Marland Kitchen levothyroxine (SYNTHROID, LEVOTHROID) 88 MCG tablet Take 88 mcg daily before breakfast by mouth. 1 tab Sun-Fri   1 1/2 tab on Sun  . lovastatin (MEVACOR) 40 MG tablet Take 40 mg by mouth at bedtime.   . Omega-3 Fatty Acids (FISH OIL) 1000 MG CAPS Take daily by mouth.  Marland Kitchen omeprazole (PRILOSEC) 40 MG capsule Take 40 mg daily by mouth.  . pioglitazone (ACTOS) 15 MG tablet Take 15 mg daily by mouth.  . sitaGLIPtin (JANUVIA) 100 MG tablet Take 1 tablet (100 mg total) by mouth daily.  . vitamin B-12 (CYANOCOBALAMIN) 1000 MCG tablet Take 1,000 mcg by mouth daily.   No facility-administered encounter medications on file as of 02/15/2018.     ALLERGIES: Allergies  Allergen Reactions  . Augmentin [Amoxicillin-Pot Clavulanate]     nervousness    VACCINATION STATUS: There is no immunization history for the selected administration types on file for this patient.  Diabetes  She presents for her follow-up diabetic visit. She has type 2 diabetes mellitus. Onset time: She was diagnosed at approximate age of 45 years. Denies any history of gestational diabetes. Her disease course has been stable. There are no hypoglycemic associated symptoms. Pertinent negatives for hypoglycemia include no confusion, headaches, pallor or seizures. There are no diabetic associated  symptoms. Pertinent negatives for diabetes include no chest pain, no polydipsia, no polyphagia and no polyuria. There are no hypoglycemic complications. Symptoms are stable. Diabetic complications include nephropathy. Risk factors for coronary artery disease include diabetes mellitus, dyslipidemia, family history, hypertension, obesity, sedentary lifestyle and  post-menopausal. She is compliant with treatment most of the time. Her weight is fluctuating minimally. She is following a generally unhealthy diet. When asked about meal planning, she reported none. She has not had a previous visit with a dietitian. She never participates in exercise. An ACE inhibitor/angiotensin II receptor blocker is not being taken. She does not see a podiatrist.Eye exam is current.  Hyperlipidemia  This is a chronic problem. The current episode started more than 1 year ago. Exacerbating diseases include chronic renal disease, diabetes, hypothyroidism and obesity. Pertinent negatives include no chest pain, myalgias or shortness of breath. Current antihyperlipidemic treatment includes statins. Risk factors for coronary artery disease include diabetes mellitus, dyslipidemia, family history, hypertension, obesity, a sedentary lifestyle and post-menopausal.  Hypertension  This is a chronic problem. The current episode started more than 1 year ago. The problem is uncontrolled. Pertinent negatives include no chest pain, headaches, palpitations or shortness of breath. Risk factors for coronary artery disease include diabetes mellitus, dyslipidemia, obesity and sedentary lifestyle. Identifiable causes of hypertension include chronic renal disease.    Review of Systems  Constitutional: Negative for chills, fever and unexpected weight change.  HENT: Negative for trouble swallowing and voice change.   Eyes: Negative for visual disturbance.  Respiratory: Negative for cough, shortness of breath and wheezing.   Cardiovascular: Negative for  chest pain, palpitations and leg swelling.  Gastrointestinal: Negative for diarrhea, nausea and vomiting.  Endocrine: Negative for cold intolerance, heat intolerance, polydipsia, polyphagia and polyuria.  Musculoskeletal: Negative for arthralgias and myalgias.  Skin: Negative for color change, pallor, rash and wound.  Neurological: Negative for seizures and headaches.  Psychiatric/Behavioral: Negative for confusion and suicidal ideas.    Objective:    BP 136/77   Pulse 64   Ht 5' 3"  (1.6 m)   Wt 162 lb (73.5 kg)   BMI 28.70 kg/m   Wt Readings from Last 3 Encounters:  02/15/18 162 lb (73.5 kg)  02/15/18 162 lb (73.5 kg)  02/09/18 161 lb (73 kg)     Physical Exam  Constitutional: She is oriented to person, place, and time. She appears well-developed.  HENT:  Head: Normocephalic and atraumatic.  Eyes: EOM are normal.  Neck: Normal range of motion. No tracheal deviation present. No thyromegaly present.  Anterior lower neck scar from prior thyroidectomy.  Cardiovascular: Normal rate and regular rhythm.  Pulmonary/Chest: Effort normal.  Abdominal: There is no abdominal tenderness. There is no guarding.  Musculoskeletal: Normal range of motion.        General: No edema.  Neurological: She is alert and oriented to person, place, and time. She has normal reflexes. No cranial nerve deficit. Coordination normal.  Skin: Skin is warm and dry. No rash noted. No erythema. No pallor.  Psychiatric: She has a normal mood and affect. Judgment normal.   Recent Results (from the past 2160 hour(s))  Hemoglobin A1c     Status: Abnormal   Collection Time: 02/08/18  8:37 AM  Result Value Ref Range   Hgb A1c MFr Bld 6.8 (H) <5.7 % of total Hgb    Comment: For someone without known diabetes, a hemoglobin A1c value of 6.5% or greater indicates that they may have  diabetes and this should be confirmed with a follow-up  test. . For someone with known diabetes, a value <7% indicates  that their  diabetes is well controlled and a value  greater than or equal to 7% indicates suboptimal  control. A1c targets should be individualized based on  duration of diabetes, age, comorbid conditions, and  other considerations. . Currently, no consensus exists regarding use of hemoglobin A1c for diagnosis of diabetes for children. .    Mean Plasma Glucose 148 (calc)   eAG (mmol/L) 8.2 (calc)  COMPLETE METABOLIC PANEL WITH GFR     Status: Abnormal   Collection Time: 02/08/18  8:37 AM  Result Value Ref Range   Glucose, Bld 146 (H) 65 - 99 mg/dL    Comment: .            Fasting reference interval . For someone without known diabetes, a glucose value >125 mg/dL indicates that they may have diabetes and this should be confirmed with a follow-up test. .    BUN 20 7 - 25 mg/dL   Creat 1.24 (H) 0.60 - 0.93 mg/dL    Comment: For patients >22 years of age, the reference limit for Creatinine is approximately 13% higher for people identified as African-American. .    GFR, Est Non African American 43 (L) > OR = 60 mL/min/1.22m   GFR, Est African American 50 (L) > OR = 60 mL/min/1.774m  BUN/Creatinine Ratio 16 6 - 22 (calc)   Sodium 135 135 - 146 mmol/L   Potassium 5.1 3.5 - 5.3 mmol/L   Chloride 99 98 - 110 mmol/L   CO2 25 20 - 32 mmol/L   Calcium 9.4 8.6 - 10.4 mg/dL   Total Protein 7.0 6.1 - 8.1 g/dL   Albumin 4.0 3.6 - 5.1 g/dL   Globulin 3.0 1.9 - 3.7 g/dL (calc)   AG Ratio 1.3 1.0 - 2.5 (calc)   Total Bilirubin 0.5 0.2 - 1.2 mg/dL   Alkaline phosphatase (APISO) 69 33 - 130 U/L   AST 18 10 - 35 U/L   ALT 8 6 - 29 U/L  T4, free     Status: None   Collection Time: 02/08/18  8:37 AM  Result Value Ref Range   Free T4 1.2 0.8 - 1.8 ng/dL  TSH     Status: None   Collection Time: 02/08/18  8:37 AM  Result Value Ref Range   TSH 2.86 0.40 - 4.50 mIU/L  Glucose, capillary     Status: Abnormal   Collection Time: 02/09/18  6:31 AM  Result Value Ref Range   Glucose-Capillary 125 (H)  70 - 99 mg/dL  Glucose, capillary     Status: Abnormal   Collection Time: 02/09/18  8:07 AM  Result Value Ref Range   Glucose-Capillary 113 (H) 70 - 99 mg/dL   Lipid Panel     Component Value Date/Time   CHOL 149 11/03/2017 1512   TRIG 86 11/03/2017 1512   HDL 53 11/03/2017 1512   CHOLHDL 2.8 11/03/2017 1512   LDLCALC 79 11/03/2017 1512      Assessment & Plan:   1. 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 -She returns with stable A1c of 6.8%.   -her diabetes is complicated by stage 2-3 renal insufficiency which is improving, obesity/sedentary life and Madeline Hibneremains at a high risk for more acute and chronic complications which include CAD, CVA, CKD, retinopathy, and neuropathy. These are all discussed in detail with the patient.  - I have counseled her on diet management and weight loss, by adopting a carbohydrate restricted/protein rich diet.  -  Suggestion is made  for her to avoid simple carbohydrates  from her diet including Cakes, Sweet Desserts / Pastries, Ice Cream, Soda (diet and regular), Sweet Tea, Candies, Chips, Cookies, Store Bought Juices, Alcohol in Excess of  1-2 drinks a day, Artificial Sweeteners, and "Sugar-free" Products. This will help patient to have stable blood glucose profile and potentially avoid unintended weight gain.   - I encouraged her to switch to  unprocessed or minimally processed complex starch and increased protein intake (animal or plant source), fruits, and vegetables.  - she is advised to stick to a routine mealtimes to eat 3 meals  a day and avoid unnecessary snacks ( to snack only to correct hypoglycemia).   - I have approached her with the following individualized plan to manage diabetes and patient agrees:   - Based on her presentation with A1c of 6.8%, she will not  require initiation of insulin at this time.    -She is advised to continue Januvia 100 mg p.o. daily with breakfast along with Actos 15 mg p.o. daily with breakfast as well.  Actos will be discontinued if she is initiated on basal insulin.   - Patient specific target  A1c;  LDL, HDL, Triglycerides, and  Waist Circumference were discussed in detail.  2) BP/HTN: Her blood pressure is controlled to target.  She is advised to continue her current blood pressure medications.   3) Lipids/HPL: Her recent lipid panel showed controlled LDL of 74.  She is advised to continue lovastatin 40 mg p.o. nightly.    4)  Weight/Diet: CDE Consult has been  initiated , exercise, and detailed carbohydrates information provided.  5) Postsurgical hypothyroidism - She underwent total thyroidectomy for large multinodular goiter in 2013.  Her previsit thyroid/neck surveillance ultrasound is negative for any thyroid remnant. -She is advised to continue levothyroxine at 88 mcg p.o. before breakfast (1 1/2 pills on Saturdays).   - We discussed about correct intake of levothyroxine, at fasting, with water, separated by at least 30 minutes from breakfast, and separated by more than 4 hours from calcium, iron, multivitamins, acid reflux medications (PPIs). -Patient is made aware of the fact that thyroid hormone replacement is needed for life, dose to be adjusted by periodic monitoring of thyroid function tests.    6) Chronic Care/Health Maintenance:  -she  is on Statin medications and  is encouraged to continue to follow up with Ophthalmology, Dentist,  Podiatrist at least yearly or according to recommendations, and advised to   stay away from smoking. I have recommended yearly flu vaccine and pneumonia vaccination at least every 5 years; moderate intensity exercise for up to 150 minutes weekly; and  sleep for at least 7 hours a day.  - I advised patient to maintain close follow up with Zhou-Talbert, Elwyn Lade, MD for  primary care needs.  - Time spent with the patient: 25 min, of which >50% was spent in reviewing her  current and  previous labs, current and previous imaging studies, previous treatments, and medications doses and developing a plan for long-term care.  Madeline Fox participated in the discussions, expressed understanding, and voiced agreement with the above plans.  All questions were answered to her satisfaction. she is encouraged to contact clinic should she have any questions or concerns prior to her return visit.   Follow up plan: - Return in about 6 months (around 08/16/2018) for Follow up with Pre-visit Labs.  Glade Lloyd, MD [facility] [address] [place], [place]  [address] Phone: [phone]  Fax: [phone]    02/15/2018, 3:20 PM  This note was partially dictated with voice recognition software. Similar sounding words can be transcribed inadequately or may not  be corrected upon review.

## 2018-02-15 NOTE — Patient Instructions (Addendum)
Goals  Increase water intake to 6 bottles per day  COntinue to increase high fiber food

## 2018-02-15 NOTE — Patient Instructions (Signed)

## 2018-02-28 ENCOUNTER — Telehealth: Payer: Self-pay

## 2018-02-28 DIAGNOSIS — E1122 Type 2 diabetes mellitus with diabetic chronic kidney disease: Secondary | ICD-10-CM

## 2018-02-28 DIAGNOSIS — N183 Chronic kidney disease, stage 3 (moderate): Principal | ICD-10-CM

## 2018-02-28 MED ORDER — SITAGLIPTIN PHOSPHATE 100 MG PO TABS: 100.0000 mg | ORAL_TABLET | Freq: Every day | ORAL | 1 refills | Status: DC

## 2018-02-28 NOTE — Telephone Encounter (Signed)
Madeline Fox, CMA  

## 2018-03-01 ENCOUNTER — Other Ambulatory Visit: Payer: Self-pay

## 2018-03-01 DIAGNOSIS — E1122 Type 2 diabetes mellitus with diabetic chronic kidney disease: Secondary | ICD-10-CM

## 2018-03-01 DIAGNOSIS — N183 Chronic kidney disease, stage 3 unspecified: Secondary | ICD-10-CM

## 2018-03-01 MED ORDER — SITAGLIPTIN PHOSPHATE 100 MG PO TABS: 100.0000 mg | ORAL_TABLET | Freq: Every day | ORAL | 1 refills | Status: DC

## 2018-04-04 ENCOUNTER — Inpatient Hospital Stay: Admission: RE | Admit: 2018-04-04 | Payer: Medicare HMO | Source: Ambulatory Visit

## 2018-04-11 ENCOUNTER — Other Ambulatory Visit: Payer: Self-pay | Admitting: "Endocrinology

## 2018-04-11 DIAGNOSIS — E1122 Type 2 diabetes mellitus with diabetic chronic kidney disease: Secondary | ICD-10-CM

## 2018-04-11 DIAGNOSIS — N183 Chronic kidney disease, stage 3 (moderate): Principal | ICD-10-CM

## 2018-06-29 ENCOUNTER — Ambulatory Visit
Admission: RE | Admit: 2018-06-29 | Discharge: 2018-06-29 | Disposition: A | Discharge status: Home / Self Care | Payer: Medicare HMO | Source: Ambulatory Visit | Attending: Family Medicine | Admitting: Family Medicine

## 2018-06-29 ENCOUNTER — Other Ambulatory Visit: Payer: Self-pay

## 2018-06-29 DIAGNOSIS — Z1231 Encounter for screening mammogram for malignant neoplasm of breast: Secondary | ICD-10-CM | POA: Diagnosis present

## 2018-08-01 ENCOUNTER — Other Ambulatory Visit: Payer: Self-pay | Admitting: "Endocrinology

## 2018-08-04 ENCOUNTER — Telehealth: Payer: Self-pay

## 2018-08-04 DIAGNOSIS — E1122 Type 2 diabetes mellitus with diabetic chronic kidney disease: Secondary | ICD-10-CM

## 2018-08-04 DIAGNOSIS — N183 Chronic kidney disease, stage 3 unspecified: Secondary | ICD-10-CM

## 2018-08-04 MED ORDER — ACCU-CHEK SOFT TOUCH LANCETS MISC: 2 refills | Status: DC

## 2018-08-04 NOTE — Telephone Encounter (Signed)
LeighAnn Echo Propp, CMA  

## 2018-08-05 ENCOUNTER — Other Ambulatory Visit: Payer: Self-pay | Admitting: "Endocrinology

## 2018-08-05 DIAGNOSIS — E1122 Type 2 diabetes mellitus with diabetic chronic kidney disease: Secondary | ICD-10-CM

## 2018-08-05 DIAGNOSIS — N183 Chronic kidney disease, stage 3 unspecified: Secondary | ICD-10-CM

## 2018-08-16 ENCOUNTER — Telehealth: Payer: Self-pay | Admitting: Nutrition

## 2018-08-16 ENCOUNTER — Ambulatory Visit: Payer: Medicare HMO | Admitting: "Endocrinology

## 2018-08-16 ENCOUNTER — Ambulatory Visit: Payer: Medicare HMO | Admitting: Nutrition

## 2018-08-16 NOTE — Telephone Encounter (Signed)
NO answer for phone visit. No VM set up. Will try again later.

## 2018-08-17 ENCOUNTER — Encounter: Payer: Self-pay | Admitting: "Endocrinology

## 2018-10-27 NOTE — Telephone Encounter (Signed)
Tc to reschedule missed appt. VM left.

## 2018-10-28 ENCOUNTER — Other Ambulatory Visit: Payer: Self-pay | Admitting: "Endocrinology

## 2018-10-28 DIAGNOSIS — E1122 Type 2 diabetes mellitus with diabetic chronic kidney disease: Secondary | ICD-10-CM

## 2018-12-01 ENCOUNTER — Encounter: Payer: Self-pay | Admitting: "Endocrinology

## 2018-12-01 ENCOUNTER — Ambulatory Visit: Payer: Medicare HMO | Admitting: "Endocrinology

## 2019-01-03 ENCOUNTER — Telehealth: Payer: Self-pay | Admitting: "Endocrinology

## 2019-01-03 DIAGNOSIS — E1122 Type 2 diabetes mellitus with diabetic chronic kidney disease: Secondary | ICD-10-CM

## 2019-01-03 NOTE — Telephone Encounter (Signed)
Pt is having some issues with her thyroid. Would like you to call her.

## 2019-01-03 NOTE — Telephone Encounter (Signed)
Pt is doing labs in the a.m. I advised her that we would need thyroid levels in able to change meds

## 2019-01-04 ENCOUNTER — Other Ambulatory Visit: Payer: Self-pay | Admitting: "Endocrinology

## 2019-01-05 LAB — HEMOGLOBIN A1C
Hgb A1c MFr Bld: 6.9 % of total Hgb — ABNORMAL HIGH (ref <5.7)
Mean Plasma Glucose: 151 (calc)
eAG (mmol/L): 8.4 (calc)

## 2019-01-05 LAB — COMPLETE METABOLIC PANEL WITH GFR
AG Ratio: 1.5 (calc) (ref 1.0–2.5)
ALT: 9 U/L (ref 6–29)
AST: 17 U/L (ref 10–35)
Albumin: 4.2 g/dL (ref 3.6–5.1)
Alkaline phosphatase (APISO): 81 U/L (ref 37–153)
BUN/Creatinine Ratio: 17 (calc) (ref 6–22)
BUN: 22 mg/dL (ref 7–25)
CO2: 25 mmol/L (ref 20–32)
Calcium: 9.7 mg/dL (ref 8.6–10.4)
Chloride: 101 mmol/L (ref 98–110)
Creat: 1.29 mg/dL — ABNORMAL HIGH (ref 0.60–0.93)
GFR, Est African American: 47 mL/min/{1.73_m2} — ABNORMAL LOW (ref >=60)
GFR, Est Non African American: 40 mL/min/{1.73_m2} — ABNORMAL LOW (ref >=60)
Globulin: 2.8 g/dL (calc) (ref 1.9–3.7)
Glucose, Bld: 155 mg/dL — ABNORMAL HIGH (ref 65–99)
Potassium: 4.9 mmol/L (ref 3.5–5.3)
Sodium: 137 mmol/L (ref 135–146)
Total Bilirubin: 0.4 mg/dL (ref 0.2–1.2)
Total Protein: 7 g/dL (ref 6.1–8.1)

## 2019-01-05 LAB — TSH: TSH: 2.11 mIU/L (ref 0.40–4.50)

## 2019-01-05 LAB — T4, FREE: Free T4: 1.2 ng/dL (ref 0.8–1.8)

## 2019-01-10 ENCOUNTER — Encounter: Payer: Self-pay | Admitting: "Endocrinology

## 2019-01-10 ENCOUNTER — Ambulatory Visit (INDEPENDENT_AMBULATORY_CARE_PROVIDER_SITE_OTHER): Payer: Medicare HMO | Admitting: "Endocrinology

## 2019-01-10 DIAGNOSIS — I10 Essential (primary) hypertension: Secondary | ICD-10-CM

## 2019-01-10 DIAGNOSIS — N1831 Chronic kidney disease, stage 3a: Secondary | ICD-10-CM

## 2019-01-10 DIAGNOSIS — E890 Postprocedural hypothyroidism: Secondary | ICD-10-CM | POA: Diagnosis not present

## 2019-01-10 DIAGNOSIS — E1122 Type 2 diabetes mellitus with diabetic chronic kidney disease: Secondary | ICD-10-CM

## 2019-01-10 DIAGNOSIS — E1121 Type 2 diabetes mellitus with diabetic nephropathy: Secondary | ICD-10-CM | POA: Diagnosis not present

## 2019-01-10 MED ORDER — PIOGLITAZONE HCL 15 MG PO TABS: 15.0000 mg | ORAL_TABLET | Freq: Every day | ORAL | 1 refills | Status: DC

## 2019-01-10 MED ORDER — LEVOTHYROXINE SODIUM 88 MCG PO TABS: 88.0000 ug | ORAL_TABLET | Freq: Every day | ORAL | 1 refills | Status: DC

## 2019-01-10 NOTE — Progress Notes (Signed)
01/10/2019, 4:17 PM                                Endocrinology Telehealth Visit Follow up Note -During COVID -19 Pandemic  I connected with Madeline Fox on 01/10/2019   by telephone and verified that I am speaking with the correct person using two identifiers. Madeline Fox, [birth year] she has verbally consented to this visit. All issues noted in this document were discussed and addressed. The format was not optimal for physical exam.   Subjective:    Patient ID: Madeline Fox, female    DOB: [birth year]  Morgen Linebaugh is being engaged in telehealth via telephone in follow-up  for management of currently controlled symptomatic type 2 diabetes, postsurgical hypothyroidism, hyperlipidemia. PMD:   Zhou-Talbert, Elwyn Lade, MD.   Past Medical History:  Diagnosis Date  . Anemia    iron deficiency anemia  . Anxiety   . Arthritis   . Breast cancer ([place]) 2007   LT LUMPECTOMY  . Cancer ([place]) 2007   L BREAST CANCER - CHEMO/RADIATION /LUMPECTOMY    . Chronic kidney disease    stage 3  . Diabetes mellitus without complication (HCC)    Type 2  . Diverticulosis of colon with hemorrhage   . Goiter   . Heart murmur   . Hyperlipidemia   . Hypertension   . Hyperthyroidism   . Hypothyroidism    s/p thyroid ablation  . Internal hemorrhoids   . Neuromuscular disorder ([place])    NEUROPATHY IN TOES  . Nocturia   . Ovarian carcinoma in situ   . Pneumonia   . Psoriasis   . S/P chemotherapy, time since greater than 12 weeks 2007   BREAST CA  . S/P radiation > 12 weeks 2007   BREAST CA  . Thrombocytopenia ([place])   . Wears dentures    partial upper   Past Surgical History:  Procedure Laterality Date  . ABDOMINAL HYSTERECTOMY    . APPENDECTOMY    . BREAST BIOPSY Left 2007   POS  . BREAST LUMPECTOMY    . CATARACT EXTRACTION W/PHACO Right 09/29/2017   Procedure: CATARACT EXTRACTION PHACO AND  INTRAOCULAR LENS PLACEMENT ([place])  RIGHT DIABETIC;  Surgeon: Leandrew Koyanagi, MD;  Location: [place];  Service: Ophthalmology;  Laterality: Right;  Diabetic - oral meds  . CATARACT EXTRACTION W/PHACO Left 02/09/2018   Procedure: CATARACT EXTRACTION PHACO AND INTRAOCULAR LENS PLACEMENT ([place]) LEFT DIABETIC;  Surgeon: Leandrew Koyanagi, MD;  Location: [place];  Service: Ophthalmology;  Laterality: Left;  . CHOLECYSTECTOMY    . COLONOSCOPY WITH PROPOFOL N/A 04/12/2015   Procedure: COLONOSCOPY WITH PROPOFOL;  Surgeon: Hulen Luster, MD;  Location: [facility];  Service: Gastroenterology;  Laterality: N/A;  . FINE NEEDLE ASPIRATION Left    left upper lobe thyroid - benign  . MASTECTOMY PARTIAL / LUMPECTOMY Left 2007   chemo and radiation  . OOPHORECTOMY     for carcinoma in situ  . THYROIDECTOMY  11/19/2011   Procedure: THYROIDECTOMY;  Surgeon: Earnstine Regal, MD;  Location: [facility];  Service: General;  Laterality: N/A;  Total Thyroidectomy  . THYROIDECTOMY     Social History   Socioeconomic History  . Marital status: Married    Spouse name: Not on file  . Number of children: Not on file  . Years of education: Not on file  . Highest education level: Not on file  Occupational History  . Not on file  Tobacco Use  . Smoking status: Never Smoker  . Smokeless tobacco: Never Used  Substance and Sexual Activity  . Alcohol use: No  . Drug use: No  . Sexual activity: Not on file  Other Topics Concern  . Not on file  Social History Narrative  . Not on file   Social Determinants of Health   Financial Resource Strain:   . Difficulty of Paying Living Expenses: Not on file  Food Insecurity:   . Worried About Charity fundraiser in the Last Year: Not on file  . Ran Out of Food in the Last Year: Not on file  Transportation Needs:   . Lack of Transportation (Medical): Not on file  . Lack of Transportation (Non-Medical): Not on file  Physical Activity:   . Days of  Exercise per Week: Not on file  . Minutes of Exercise per Session: Not on file  Stress:   . Feeling of Stress : Not on file  Social Connections:   . Frequency of Communication with Friends and Family: Not on file  . Frequency of Social Gatherings with Friends and Family: Not on file  . Attends Religious Services: Not on file  . Active Member of Clubs or Organizations: Not on file  . Attends Archivist Meetings: Not on file  . Marital Status: Not on file   Outpatient Encounter Medications as of 01/10/2019  Medication Sig  . ACCU-CHEK AVIVA PLUS test strip USE UP TO 2-3 TIMES DAILY AS DIRECTED  . Accu-Chek FastClix Lancets MISC TEST  UP  TO TWO TO THREE TIMES DAILY.  Marland Kitchen Alcohol Swabs (B-D SINGLE USE SWABS REGULAR) PADS USE TWICE DAILY  . ALPRAZolam (NIRAVAM) 0.25 MG dissolvable tablet Take 0.25 mg by mouth every morning.   Marland Kitchen aspirin 81 MG tablet Take 81 mg by mouth every morning.   . blood glucose meter kit and supplies KIT Dispense based on patient and insurance preference. Use up to 2-3 times daily as directed. (FOR ICD-10 E11.65).  . cetirizine (ZYRTEC) 10 MG tablet Take 10 mg by mouth at bedtime.  . Cholecalciferol (VITAMIN D3) 1000 units CAPS Take daily by mouth.  . diazepam (VALIUM) 5 MG tablet at bedtime.  . ferrous sulfate 325 (65 FE) MG tablet Take 325 mg by mouth 2 (two) times daily.  . hydrOXYzine (ATARAX/VISTARIL) 25 MG tablet 3 (three) times daily as needed.  Marland Kitchen levothyroxine (SYNTHROID) 88 MCG tablet Take 1 tablet (88 mcg total) by mouth daily before breakfast.  . lovastatin (MEVACOR) 40 MG tablet Take 40 mg by mouth at bedtime.   . Omega-3 Fatty Acids (FISH OIL) 1000 MG CAPS Take daily by mouth.  Marland Kitchen omeprazole (PRILOSEC) 40 MG capsule Take 40 mg daily by mouth.  . pioglitazone (ACTOS) 15 MG tablet Take 1 tablet (15 mg total) by mouth daily.  . sitaGLIPtin (JANUVIA) 50 MG tablet Take 50 mg by mouth daily.  . vitamin B-12 (CYANOCOBALAMIN) 1000 MCG tablet Take 1,000  mcg by mouth daily.  . [DISCONTINUED] levothyroxine (SYNTHROID) 100 MCG tablet Take 88 mcg by mouth daily before  breakfast.  . [DISCONTINUED] pioglitazone (ACTOS) 15 MG tablet Take 15 mg daily by mouth.   No facility-administered encounter medications on file as of 01/10/2019.    ALLERGIES: Allergies  Allergen Reactions  . Augmentin [Amoxicillin-Pot Clavulanate]     nervousness    VACCINATION STATUS: There is no immunization history for the selected administration types on file for this patient.  Diabetes She presents for her follow-up diabetic visit. She has type 2 diabetes mellitus. Onset time: She was diagnosed at approximate age of 23 years. Denies any history of gestational diabetes. Her disease course has been stable. There are no hypoglycemic associated symptoms. Pertinent negatives for hypoglycemia include no confusion, headaches, pallor or seizures. There are no diabetic associated symptoms. Pertinent negatives for diabetes include no chest pain, no polydipsia, no polyphagia and no polyuria. There are no hypoglycemic complications. Symptoms are stable. Diabetic complications include nephropathy. Risk factors for coronary artery disease include diabetes mellitus, dyslipidemia, family history, hypertension, obesity, sedentary lifestyle and post-menopausal. She is compliant with treatment most of the time. Her weight is stable. She is following a generally unhealthy diet. When asked about meal planning, she reported none. She has not had a previous visit with a dietitian. She never participates in exercise. Her breakfast blood glucose range is generally 130-140 mg/dl. Her bedtime blood glucose range is generally 140-180 mg/dl. Her overall blood glucose range is 140-180 mg/dl. An ACE inhibitor/angiotensin II receptor blocker is not being taken. She does not see a podiatrist.Eye exam is current.  Hyperlipidemia This is a chronic problem. The current episode started more than 1 year ago.  Exacerbating diseases include chronic renal disease, diabetes, hypothyroidism and obesity. Pertinent negatives include no chest pain, myalgias or shortness of breath. Current antihyperlipidemic treatment includes statins. Risk factors for coronary artery disease include diabetes mellitus, dyslipidemia, family history, hypertension, obesity, a sedentary lifestyle and post-menopausal.  Hypertension This is a chronic problem. The current episode started more than 1 year ago. The problem is uncontrolled. Pertinent negatives include no chest pain, headaches, palpitations or shortness of breath. Risk factors for coronary artery disease include diabetes mellitus, dyslipidemia, obesity and sedentary lifestyle. Identifiable causes of hypertension include chronic renal disease.   Review of systems: Limited as above.  Objective:    There were no vitals taken for this visit.  Wt Readings from Last 3 Encounters:  02/15/18 162 lb (73.5 kg)  02/15/18 162 lb (73.5 kg)  02/09/18 161 lb (73 kg)     Recent Results (from the past 2160 hour(s))  COMPLETE METABOLIC PANEL WITH GFR     Status: Abnormal   Collection Time: 01/04/19  8:36 AM  Result Value Ref Range   Glucose, Bld 155 (H) 65 - 99 mg/dL    Comment: .            Fasting reference interval . For someone without known diabetes, a glucose value >125 mg/dL indicates that they may have diabetes and this should be confirmed with a follow-up test. .    BUN 22 7 - 25 mg/dL   Creat 1.29 (H) 0.60 - 0.93 mg/dL    Comment: For patients >50 years of age, the reference limit for Creatinine is approximately 13% higher for people identified as African-American. .    GFR, Est Non African American 40 (L) > OR = 60 mL/min/1.41m   GFR, Est African American 47 (L) > OR = 60 mL/min/1.745m  BUN/Creatinine Ratio 17 6 - 22 (calc)   Sodium 137 135 - 146 mmol/L  Potassium 4.9 3.5 - 5.3 mmol/L   Chloride 101 98 - 110 mmol/L   CO2 25 20 - 32 mmol/L   Calcium 9.7  8.6 - 10.4 mg/dL   Total Protein 7.0 6.1 - 8.1 g/dL   Albumin 4.2 3.6 - 5.1 g/dL   Globulin 2.8 1.9 - 3.7 g/dL (calc)   AG Ratio 1.5 1.0 - 2.5 (calc)   Total Bilirubin 0.4 0.2 - 1.2 mg/dL   Alkaline phosphatase (APISO) 81 37 - 153 U/L   AST 17 10 - 35 U/L   ALT 9 6 - 29 U/L  Hemoglobin A1c     Status: Abnormal   Collection Time: 01/04/19  8:36 AM  Result Value Ref Range   Hgb A1c MFr Bld 6.9 (H) <5.7 % of total Hgb    Comment: For someone without known diabetes, a hemoglobin A1c value of 6.5% or greater indicates that they may have  diabetes and this should be confirmed with a follow-up  test. . For someone with known diabetes, a value <7% indicates  that their diabetes is well controlled and a value  greater than or equal to 7% indicates suboptimal  control. A1c targets should be individualized based on  duration of diabetes, age, comorbid conditions, and  other considerations. . Currently, no consensus exists regarding use of hemoglobin A1c for diagnosis of diabetes for children. .    Mean Plasma Glucose 151 (calc)   eAG (mmol/L) 8.4 (calc)  TSH     Status: None   Collection Time: 01/04/19  8:36 AM  Result Value Ref Range   TSH 2.11 0.40 - 4.50 mIU/L  T4, free     Status: None   Collection Time: 01/04/19  8:36 AM  Result Value Ref Range   Free T4 1.2 0.8 - 1.8 ng/dL   Lipid Panel     Component Value Date/Time   CHOL 149 11/03/2017 1512   TRIG 86 11/03/2017 1512   HDL 53 11/03/2017 1512   CHOLHDL 2.8 11/03/2017 1512   LDLCALC 79 11/03/2017 1512      Assessment & Plan:   1. 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 -She reports controlled glycemic profile both fasting and postprandial and A1c of 6.9%, stable.     -her diabetes is complicated by stage 2-3 renal insufficiency which is improving,  obesity/sedentary life and Braeden Dolinski remains at a high risk for more acute and chronic complications which include CAD, CVA, CKD, retinopathy, and neuropathy. These are all discussed in detail with the patient.  - I have counseled her on diet management and weight loss, by adopting a carbohydrate restricted/protein rich diet.  - she  admits there is a room for improvement in her diet and drink choices. -  Suggestion is made for her to avoid simple carbohydrates  from her diet including Cakes, Sweet Desserts / Pastries, Ice Cream, Soda (diet and regular), Sweet Tea, Candies, Chips, Cookies, Sweet Pastries,  Store Bought Juices, Alcohol in Excess of  1-2 drinks a day, Artificial Sweeteners, Coffee Creamer, and "Sugar-free" Products. This will help patient to have stable blood glucose profile and potentially avoid unintended weight gain.  - I encouraged her to switch to  unprocessed or minimally processed complex starch and increased protein intake (animal or plant source), fruits, and vegetables.  - she is advised to stick to  a routine mealtimes to eat 3 meals  a day and avoid unnecessary snacks ( to snack only to correct hypoglycemia).   - I have approached her with the following individualized plan to manage diabetes and patient agrees:   - Based on her presentation with A1c of 6.9%, she will not require initiation of insulin at this time.     -She is advised to continue Januvia 50 mg p.o. daily at breakfast, Actos 15 units p.o. daily at breakfast as well.     Actos will be discontinued if she is initiated on basal insulin.   - Patient specific target  A1c;  LDL, HDL, Triglycerides, and  Waist Circumference were discussed in detail.  2) BP/HTN: she is advised to home monitor blood pressure and report if > 140/90 on 2 separate readings.   She is advised to continue her current blood pressure medications.   3) Lipids/HPL: Her recent lipid panel showed controlled LDL of 74.  She is advised  to continue lovastatin 40 mg p.o. nightly.    4)  Weight/Diet: CDE Consult has been  initiated , exercise, and detailed carbohydrates information provided.  5) Postsurgical hypothyroidism - She underwent total thyroidectomy for large multinodular goiter in 2013.  Her recent  thyroid/neck surveillance ultrasound is negative for any thyroid remnant. -She did not tolerate levothyroxine 100 mcg p.o. daily.  She is advised to continue levothyroxine 88 mcg p.o. daily before breakfast.   - We discussed about the correct intake of her thyroid hormone, on empty stomach at fasting, with water, separated by at least 30 minutes from breakfast and other medications,  and separated by more than 4 hours from calcium, iron, multivitamins, acid reflux medications (PPIs). -Patient is made aware of the fact that thyroid hormone replacement is needed for life, dose to be adjusted by periodic monitoring of thyroid function tests.  6) Chronic Care/Health Maintenance:  -she  is on Statin medications and  is encouraged to continue to follow up with Ophthalmology, Dentist,  Podiatrist at least yearly or according to recommendations, and advised to   stay away from smoking. I have recommended yearly flu vaccine and pneumonia vaccination at least every 5 years; moderate intensity exercise for up to 150 minutes weekly; and  sleep for at least 7 hours a day.  - I advised patient to maintain close follow up with Zhou-Talbert, Elwyn Lade, MD for primary care needs.  - Patient Care Time Today:  25 min, of which >50% was spent in  counseling and the rest reviewing her  current and  previous labs/studies, previous treatments, her blood glucose readings, and medications' doses and developing a plan for long-term care based on the latest recommendations for standards of care.   Madeline Fox participated in the discussions, expressed understanding, and voiced agreement with the above plans.  All questions were answered to her  satisfaction. she is encouraged to contact clinic should she have any questions or concerns prior to her return visit.   Follow up plan: - Return in about 3 months (around 04/10/2019) for Follow up with Pre-visit Labs, Next Visit A1c in Office.  Glade Lloyd, MD [facility] [address] [place], [place] [address] Phone: [phone]  Fax: [phone]    01/10/2019, 4:17 PM  This note was partially dictated with voice recognition software. Similar sounding words can be transcribed inadequately or may not  be corrected upon review.

## 2019-01-31 ENCOUNTER — Telehealth: Payer: Self-pay | Admitting: "Endocrinology

## 2019-01-31 NOTE — Telephone Encounter (Signed)
Faxed and mailed patient assistance form to patient and patient assistance

## 2019-02-08 DIAGNOSIS — N189 Chronic kidney disease, unspecified: Secondary | ICD-10-CM | POA: Insufficient documentation

## 2019-02-08 DIAGNOSIS — D631 Anemia in chronic kidney disease: Secondary | ICD-10-CM | POA: Insufficient documentation

## 2019-02-08 IMAGING — MG MM DIGITAL SCREENING BILAT W/ TOMO W/ CAD
9 of 15 series · 9 of 31 positions shown · non-contrast
Comparison: Previous exam(s).

CLINICAL DATA: Screening.

EXAM:
2D DIGITAL SCREENING BILATERAL MAMMOGRAM WITH CAD AND ADJUNCT TOMO

[L CC]
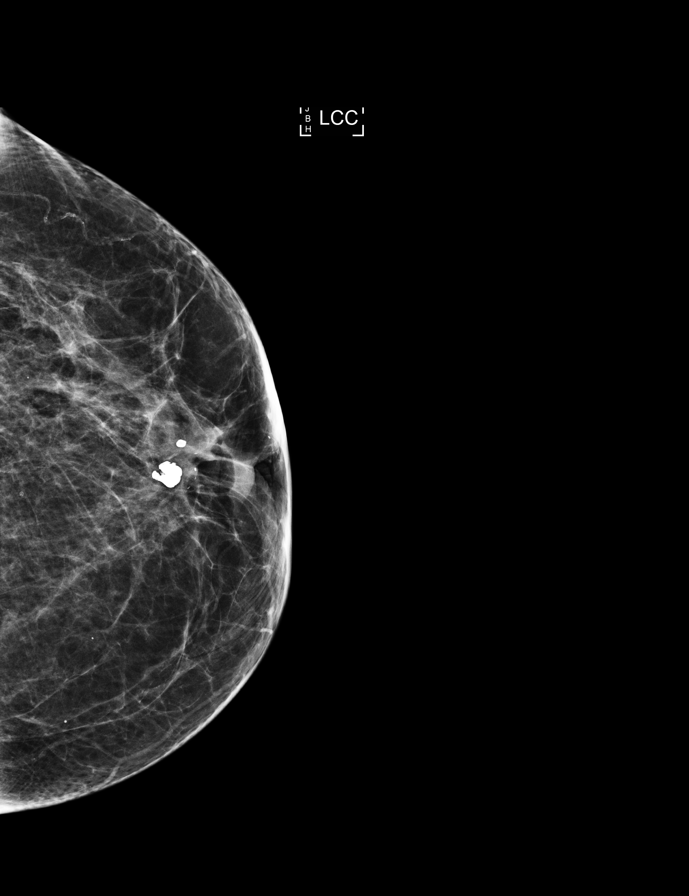

[L MLO (1 of 2)]
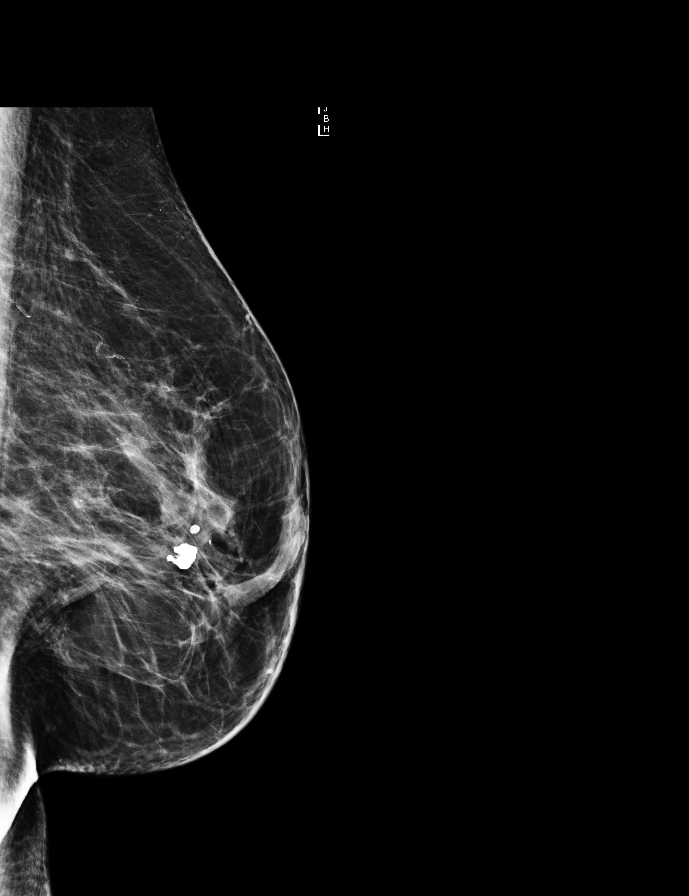

[R MLO (1 of 2)]
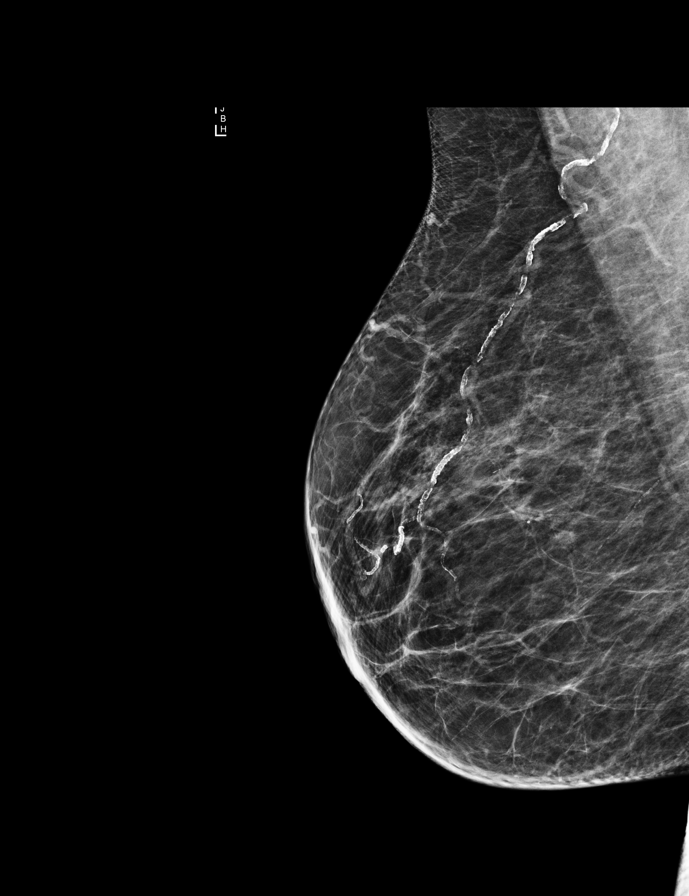

[L CC synth-2D]
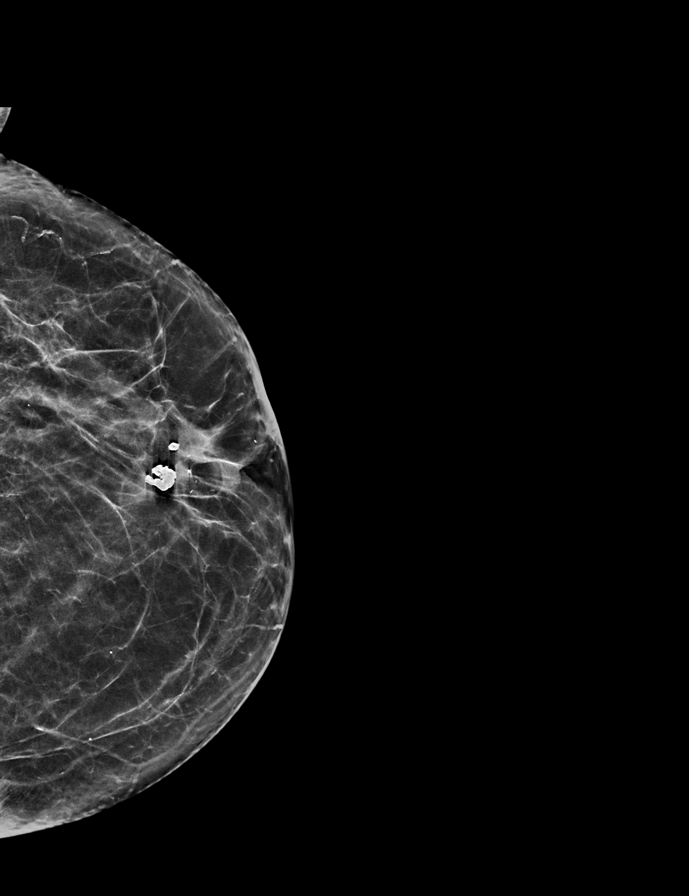

[R CC synth-2D]
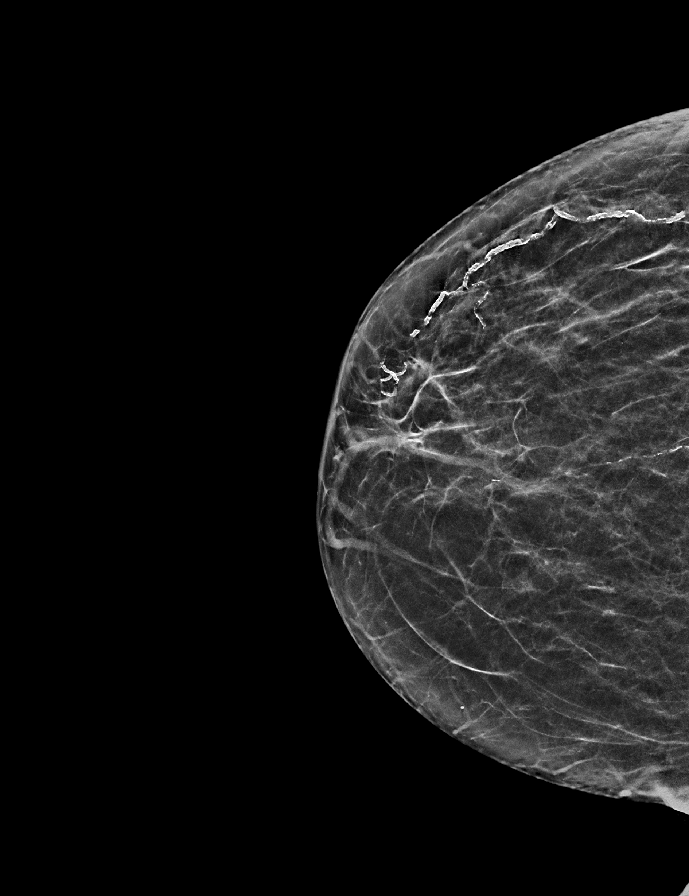

[R MLO (2 of 2)]
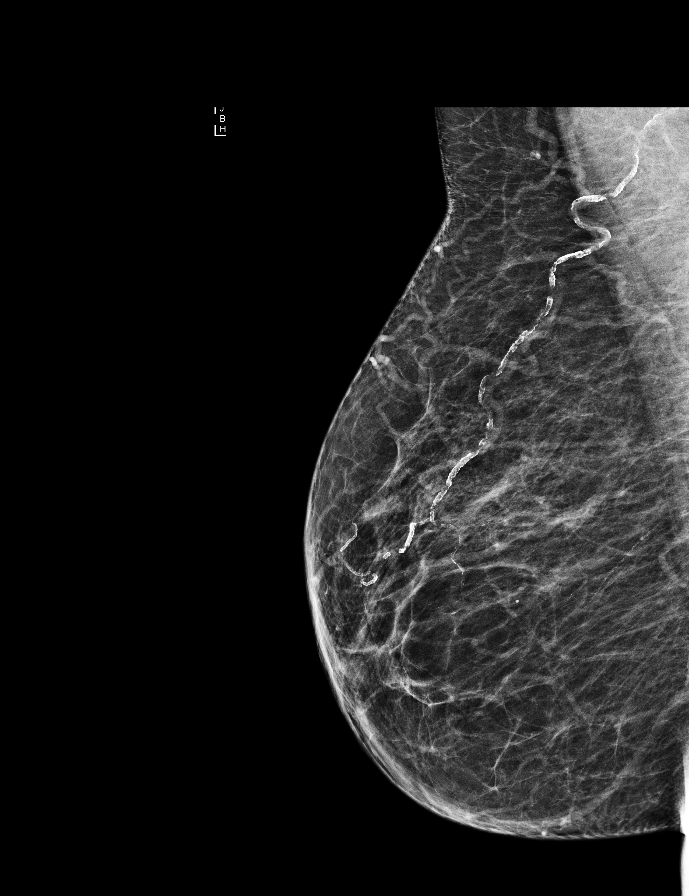

[R MLO synth-2D]
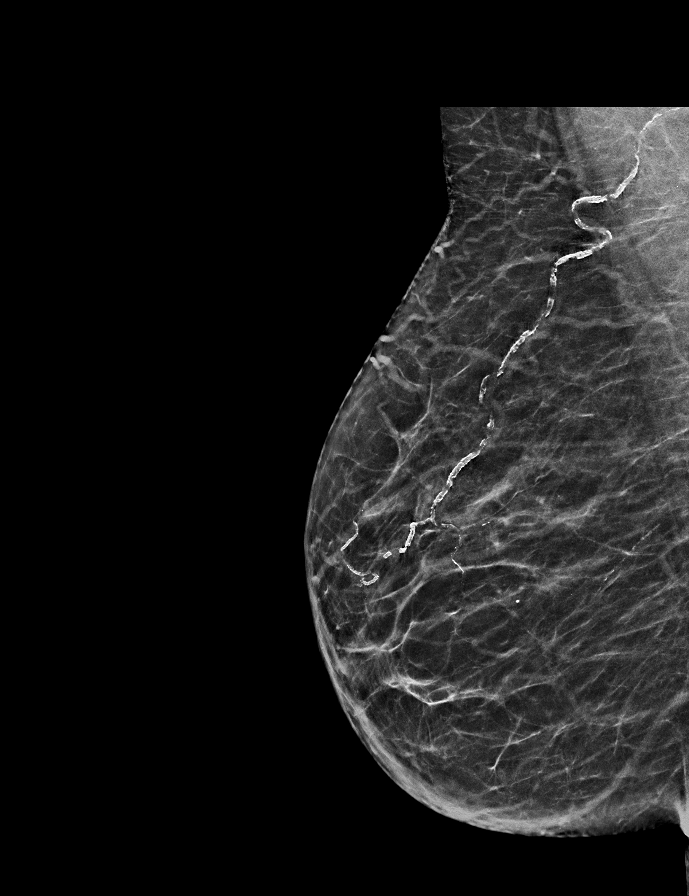

[L MLO (2 of 2)]
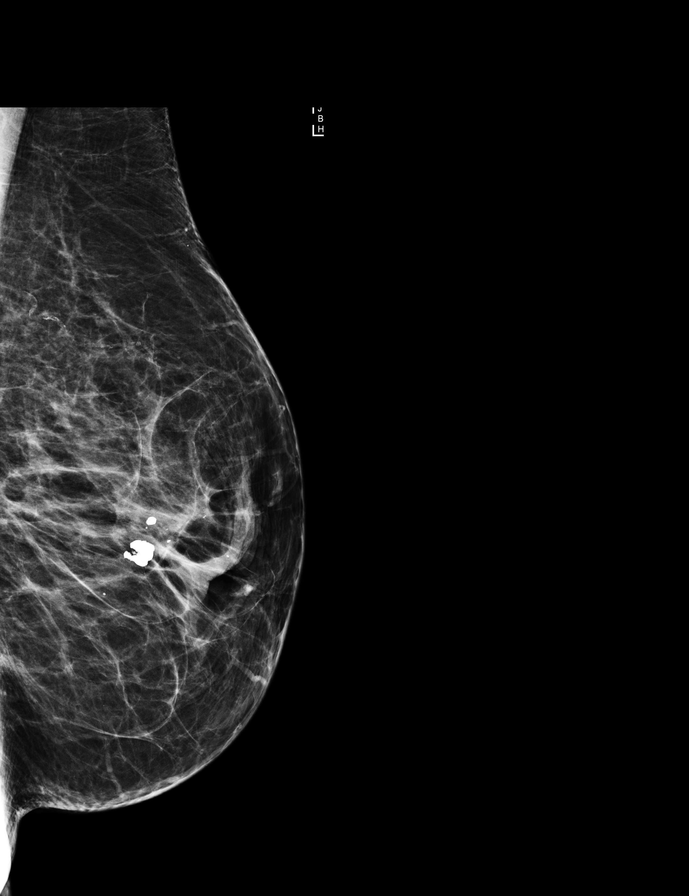

[R CC]
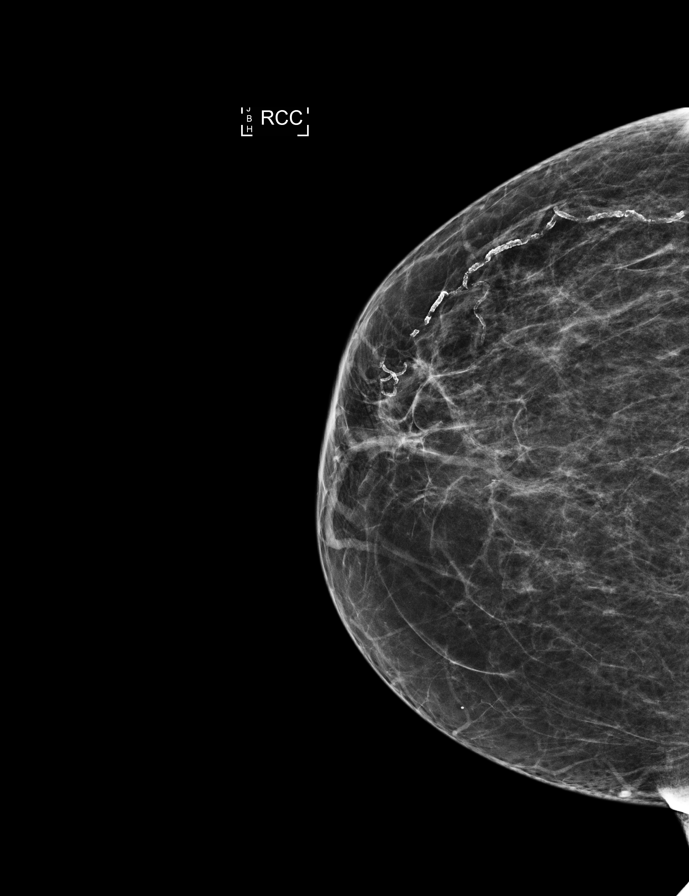

[9 of 31 positions shown; findings below may reference images not displayed]

ACR Breast Density Category b: There are scattered areas of
fibroglandular density.
FINDINGS: There are no findings suspicious for malignancy. Images were
processed with CAD.
IMPRESSION: No mammographic evidence of malignancy. A result letter of this
screening mammogram will be mailed directly to the patient.

RECOMMENDATION:
Screening mammogram in one year. (Code:97-6-RS4)

BI-RADS CATEGORY  1: Negative.

## 2019-04-12 ENCOUNTER — Other Ambulatory Visit: Payer: Self-pay | Admitting: "Endocrinology

## 2019-04-12 ENCOUNTER — Other Ambulatory Visit: Payer: Self-pay | Admitting: Family Medicine

## 2019-04-12 DIAGNOSIS — N63 Unspecified lump in unspecified breast: Secondary | ICD-10-CM

## 2019-04-12 DIAGNOSIS — Z13820 Encounter for screening for osteoporosis: Secondary | ICD-10-CM

## 2019-04-13 ENCOUNTER — Ambulatory Visit: Payer: Medicare HMO | Admitting: "Endocrinology

## 2019-04-24 ENCOUNTER — Ambulatory Visit
Admission: RE | Admit: 2019-04-24 | Discharge: 2019-04-24 | Disposition: A | Discharge status: Home / Self Care | Payer: Medicare HMO | Source: Ambulatory Visit | Attending: Family Medicine | Admitting: Family Medicine

## 2019-04-24 DIAGNOSIS — N63 Unspecified lump in unspecified breast: Secondary | ICD-10-CM | POA: Diagnosis not present

## 2019-04-24 DIAGNOSIS — R921 Mammographic calcification found on diagnostic imaging of breast: Secondary | ICD-10-CM | POA: Diagnosis not present

## 2019-04-24 HISTORY — DX: Personal history of antineoplastic chemotherapy: Z92.21

## 2019-04-24 HISTORY — DX: Personal history of irradiation: Z92.3

## 2019-07-19 ENCOUNTER — Other Ambulatory Visit: Payer: Self-pay | Admitting: "Endocrinology

## 2019-10-03 ENCOUNTER — Other Ambulatory Visit: Payer: Self-pay | Admitting: "Endocrinology

## 2019-10-04 LAB — TSH: TSH: 0.84 mIU/L (ref 0.40–4.50)

## 2019-10-04 LAB — VITAMIN D 25 HYDROXY (VIT D DEFICIENCY, FRACTURES): Vit D, 25-Hydroxy: 36 ng/mL (ref 30–100)

## 2019-10-04 LAB — T4, FREE: Free T4: 1.6 ng/dL (ref 0.8–1.8)

## 2019-10-05 ENCOUNTER — Encounter: Payer: Self-pay | Admitting: "Endocrinology

## 2019-10-05 ENCOUNTER — Telehealth (INDEPENDENT_AMBULATORY_CARE_PROVIDER_SITE_OTHER): Payer: Medicare HMO | Admitting: "Endocrinology

## 2019-10-05 VITALS — BP 126/47 | Ht 63.0 in | Wt 162.0 lb

## 2019-10-05 DIAGNOSIS — I10 Essential (primary) hypertension: Secondary | ICD-10-CM

## 2019-10-05 DIAGNOSIS — E890 Postprocedural hypothyroidism: Secondary | ICD-10-CM | POA: Diagnosis not present

## 2019-10-05 DIAGNOSIS — E1121 Type 2 diabetes mellitus with diabetic nephropathy: Secondary | ICD-10-CM | POA: Diagnosis not present

## 2019-10-05 DIAGNOSIS — N1831 Chronic kidney disease, stage 3a: Secondary | ICD-10-CM

## 2019-10-05 DIAGNOSIS — E1122 Type 2 diabetes mellitus with diabetic chronic kidney disease: Secondary | ICD-10-CM

## 2019-10-05 NOTE — Progress Notes (Signed)
10/05/2019, 3:22 PM                                              Endocrinology Telehealth Visit Follow up Note -During COVID -19 Pandemic  This visit type was conducted  via telephone due to national recommendations for restrictions regarding the COVID-19 Pandemic  in an effort to limit this patient's exposure and mitigate transmission of the corona virus.   I connected with Madeline Fox on 10/05/2019   by telephone and verified that I am speaking with the correct person using two identifiers. Madeline Fox, 04-01-1945. she has verbally consented to this visit.  I was in my office and patient was in her residence. No other persons were with me during the encounter. All issues noted in this document were discussed and addressed. The format was not optimal for physical exam.  Subjective:    Patient ID: Madeline Fox, female    DOB: August 07, 1945.  Meagan Ancona is being engaged in telehealth via telephone in follow-up  for management of currently controlled symptomatic type 2 diabetes, postsurgical hypothyroidism, hyperlipidemia. PMD:   Zhou-Talbert, Elwyn Lade, MD.   Past Medical History:  Diagnosis Date  . Anemia    iron deficiency anemia  . Anxiety   . Arthritis   . Breast cancer ([place]) 2007   LT LUMPECTOMY  . Cancer ([place]) 2007   L BREAST CANCER - CHEMO/RADIATION /LUMPECTOMY    . Chronic kidney disease    stage 3  . Diabetes mellitus without complication (HCC)    Type 2  . Diverticulosis of colon with hemorrhage   . Goiter   . Heart murmur   . Hyperlipidemia   . Hypertension   . Hyperthyroidism   . Hypothyroidism    s/p thyroid ablation  . Internal hemorrhoids   . Neuromuscular disorder ([place])    NEUROPATHY IN TOES  . Nocturia   . Ovarian carcinoma in situ   . Personal history of chemotherapy   . Personal history of radiation therapy   . Pneumonia   . Psoriasis   . S/P chemotherapy, time  since greater than 12 weeks 2007   BREAST CA  . S/P radiation > 12 weeks 2007   BREAST CA  . Thrombocytopenia ([place])   . Wears dentures    partial upper   Past Surgical History:  Procedure Laterality Date  . ABDOMINAL HYSTERECTOMY    . APPENDECTOMY    . BREAST BIOPSY Left 2007   POS  . BREAST LUMPECTOMY    . CATARACT EXTRACTION W/PHACO Right 09/29/2017   Procedure: CATARACT EXTRACTION PHACO AND INTRAOCULAR LENS PLACEMENT ([place])  RIGHT DIABETIC;  Surgeon: Leandrew Koyanagi, MD;  Location: [facility];  Service: Ophthalmology;  Laterality: Right;  Diabetic - oral meds  . CATARACT EXTRACTION W/PHACO Left 02/09/2018   Procedure: CATARACT EXTRACTION PHACO AND INTRAOCULAR LENS PLACEMENT ([place]) LEFT DIABETIC;  Surgeon: Leandrew Koyanagi, MD;  Location: [place] [facility];  Service: Ophthalmology;  Laterality: Left;  . CHOLECYSTECTOMY    .  COLONOSCOPY WITH PROPOFOL N/A 04/12/2015   Procedure: COLONOSCOPY WITH PROPOFOL;  Surgeon: Hulen Luster, MD;  Location: [facility] ENDOSCOPY;  Service: Gastroenterology;  Laterality: N/A;  . FINE NEEDLE ASPIRATION Left    left upper lobe thyroid - benign  . MASTECTOMY PARTIAL / LUMPECTOMY Left 2007   chemo and radiation  . OOPHORECTOMY     for carcinoma in situ  . THYROIDECTOMY  11/19/2011   Procedure: THYROIDECTOMY;  Surgeon: Earnstine Regal, MD;  Location: [facility];  Service: General;  Laterality: N/A;  Total Thyroidectomy  . THYROIDECTOMY     Social History   Socioeconomic History  . Marital status: Married    Spouse name: Not on file  . Number of children: Not on file  . Years of education: Not on file  . Highest education level: Not on file  Occupational History  . Not on file  Tobacco Use  . Smoking status: Never Smoker  . Smokeless tobacco: Never Used  Vaping Use  . Vaping Use: Never used  Substance and Sexual Activity  . Alcohol use: No  . Drug use: No  . Sexual activity: Not on file  Other Topics Concern  . Not on file  Social  History Narrative  . Not on file   Social Determinants of Health   Financial Resource Strain:   . Difficulty of Paying Living Expenses: Not on file  Food Insecurity:   . Worried About Charity fundraiser in the Last Year: Not on file  . Ran Out of Food in the Last Year: Not on file  Transportation Needs:   . Lack of Transportation (Medical): Not on file  . Lack of Transportation (Non-Medical): Not on file  Physical Activity:   . Days of Exercise per Week: Not on file  . Minutes of Exercise per Session: Not on file  Stress:   . Feeling of Stress : Not on file  Social Connections:   . Frequency of Communication with Friends and Family: Not on file  . Frequency of Social Gatherings with Friends and Family: Not on file  . Attends Religious Services: Not on file  . Active Member of Clubs or Organizations: Not on file  . Attends Archivist Meetings: Not on file  . Marital Status: Not on file   Outpatient Encounter Medications as of 10/05/2019  Medication Sig  . ACCU-CHEK AVIVA PLUS test strip USE UP TO 2-3 TIMES DAILY AS DIRECTED  . Accu-Chek FastClix Lancets MISC TEST  UP  TO TWO TO THREE TIMES DAILY.  Marland Kitchen Alcohol Swabs (B-D SINGLE USE SWABS REGULAR) PADS USE TWICE DAILY  . ALPRAZolam (NIRAVAM) 0.25 MG dissolvable tablet Take 0.25 mg by mouth every morning.   Marland Kitchen aspirin 81 MG tablet Take 81 mg by mouth every morning.   . blood glucose meter kit and supplies KIT Dispense based on patient and insurance preference. Use up to 2-3 times daily as directed. (FOR ICD-10 E11.65).  . cetirizine (ZYRTEC) 10 MG tablet Take 10 mg by mouth at bedtime.  . Cholecalciferol (VITAMIN D3) 1000 units CAPS Take daily by mouth.  . ferrous sulfate 325 (65 FE) MG tablet Take 325 mg by mouth 2 (two) times daily.  Marland Kitchen levothyroxine (SYNTHROID) 88 MCG tablet Take 1 tablet (88 mcg total) by mouth daily before breakfast.  . lovastatin (MEVACOR) 40 MG tablet Take 40 mg by mouth at bedtime.   . Omega-3 Fatty  Acids (FISH OIL) 1000 MG CAPS Take daily by mouth.  Marland Kitchen  omeprazole (PRILOSEC) 40 MG capsule Take 40 mg daily by mouth.  . pioglitazone (ACTOS) 15 MG tablet Take 1 tablet (15 mg total) by mouth daily.  . sitaGLIPtin (JANUVIA) 50 MG tablet Take 50 mg by mouth daily.  . vitamin B-12 (CYANOCOBALAMIN) 1000 MCG tablet Take 1,000 mcg by mouth daily.  . [DISCONTINUED] diazepam (VALIUM) 5 MG tablet at bedtime.  . [DISCONTINUED] hydrOXYzine (ATARAX/VISTARIL) 25 MG tablet 3 (three) times daily as needed.   No facility-administered encounter medications on file as of 10/05/2019.    ALLERGIES: Allergies  Allergen Reactions  . Augmentin [Amoxicillin-Pot Clavulanate]     nervousness    VACCINATION STATUS: There is no immunization history for the selected administration types on file for this patient.  Diabetes She presents for her follow-up diabetic visit. She has type 2 diabetes mellitus. Onset time: She was diagnosed at approximate age of 35 years. Denies any history of gestational diabetes. Her disease course has been stable. There are no hypoglycemic associated symptoms. Pertinent negatives for hypoglycemia include no confusion, headaches, pallor or seizures. There are no diabetic associated symptoms. Pertinent negatives for diabetes include no chest pain, no polydipsia, no polyphagia and no polyuria. There are no hypoglycemic complications. Symptoms are stable. Diabetic complications include nephropathy. Risk factors for coronary artery disease include diabetes mellitus, dyslipidemia, family history, hypertension, obesity, sedentary lifestyle and post-menopausal. She is compliant with treatment most of the time. Her weight is fluctuating minimally. She is following a generally unhealthy diet. When asked about meal planning, she reported none. She has not had a previous visit with a dietitian. She never participates in exercise. Her home blood glucose trend is fluctuating minimally. Her breakfast blood  glucose range is generally 130-140 mg/dl. Her bedtime blood glucose range is generally 130-140 mg/dl. Her overall blood glucose range is 130-140 mg/dl. (She reports average blood glucose is between 100-135.  No hypoglycemia.) An ACE inhibitor/angiotensin II receptor blocker is not being taken. She does not see a podiatrist.Eye exam is current.  Hyperlipidemia This is a chronic problem. The current episode started more than 1 year ago. Exacerbating diseases include chronic renal disease, diabetes, hypothyroidism and obesity. Pertinent negatives include no chest pain, myalgias or shortness of breath. Current antihyperlipidemic treatment includes statins. Risk factors for coronary artery disease include diabetes mellitus, dyslipidemia, family history, hypertension, obesity, a sedentary lifestyle and post-menopausal.  Hypertension This is a chronic problem. The current episode started more than 1 year ago. The problem is uncontrolled. Pertinent negatives include no chest pain, headaches, palpitations or shortness of breath. Risk factors for coronary artery disease include diabetes mellitus, dyslipidemia, obesity and sedentary lifestyle. Identifiable causes of hypertension include chronic renal disease.   Review of systems: Limited as above.  Objective:    BP (!) 126/47   Ht _0  (1.6 m)   Wt 162 lb (73.5 kg)   BMI 28.70 kg/m   Wt Readings from Last 3 Encounters:  10/05/19 162 lb (73.5 kg)  02/15/18 162 lb (73.5 kg)  02/15/18 162 lb (73.5 kg)     Recent Results (from the past 2160 hour(s))  VITAMIN D 25 Hydroxy (Vit-D Deficiency, Fractures)     Status: None   Collection Time: 10/03/19  8:42 AM  Result Value Ref Range   Vit D, 25-Hydroxy 36 30 - 100 ng/mL    Comment: Vitamin D Status         25-OH Vitamin D: . Deficiency:                    <  20 ng/mL Insufficiency:             20 - 29 ng/mL Optimal:                 > or = 30 ng/mL . For 25-OH Vitamin D testing on patients on   D2-supplementation and patients for whom quantitation  of D2 and D3 fractions is required, the [facility](TM) 25-OH VIT D, (D2,D3), LC/MS/MS is recommended: order  code [phone] (patients >1yr). See Note 1 . Note 1 . For additional information, please refer to  http://education.[facility].com/faq/FAQ199  (This link is being provided for informational/ educational purposes only.)   TSH     Status: None   Collection Time: 10/03/19  8:42 AM  Result Value Ref Range   TSH 0.84 0.40 - 4.50 mIU/L  T4, free     Status: None   Collection Time: 10/03/19  8:42 AM  Result Value Ref Range   Free T4 1.6 0.8 - 1.8 ng/dL   Lipid Panel     Component Value Date/Time   CHOL 149 11/03/2017 1512   TRIG 86 11/03/2017 1512   HDL 53 11/03/2017 1512   CHOLHDL 2.8 11/03/2017 1512   LDLCALC 79 11/03/2017 1512      Assessment & Plan:   1. 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 -She reports controlled glycemic profile to near target levels both fasting and postprandial.  A1c was 6.9% during her last visit.      -her diabetes is complicated by stage 2-3 renal insufficiency which is improving, obesity/sedentary life and JKrystalynn Ridgewayremains at a high risk for more acute and chronic complications which include CAD, CVA, CKD, retinopathy, and neuropathy. These are all discussed in detail with the patient.  - I have counseled her on diet management and weight loss, by adopting a carbohydrate restricted/protein rich diet.  - she  admits there is a room for improvement in her diet and drink choices. -  Suggestion is made for her to avoid simple carbohydrates  from her diet including Cakes, Sweet Desserts / Pastries, Ice Cream, Soda (diet and regular), Sweet Tea, Candies, Chips, Cookies, Sweet Pastries,  Store Bought Juices, Alcohol in  Excess of  1-2 drinks a day, Artificial Sweeteners, Coffee Creamer, and "Sugar-free" Products. This will help patient to have stable blood glucose profile and potentially avoid unintended weight gain.  - I encouraged her to switch to  unprocessed or minimally processed complex starch and increased protein intake (animal or plant source), fruits, and vegetables.  - she is advised to stick to a routine mealtimes to eat 3 meals  a day and avoid unnecessary snacks ( to snack only to correct hypoglycemia).   - I have approached her with the following individualized plan to manage diabetes and patient agrees:   - Based on her recent A1c of 6.9%, she will not require any additional treatment.  She is advised to continue Januvia 50 mg p.o. daily at breakfast, Actos 15 mg p.o. daily at breakfast as well.    - Patient specific target  A1c;  LDL, HDL, Triglycerides, and  Waist Circumference were discussed in detail.  2) BP/HTN: she is advised to home monitor blood pressure and report if > 140/90 on 2 separate readings.    She is advised to continue her current blood pressure medications.  3) Lipids/HPL: Her recent lipid panel showed controlled LDL of 74.  She is advised to continue lovastatin 40 mg p.o. nightly.    4)  Weight/Diet: CDE Consult has been  initiated , exercise, and detailed carbohydrates information provided.  5) Postsurgical hypothyroidism - She underwent total thyroidectomy for large multinodular goiter in 2013.  In the interval, her thyroid hormone was adjusted by her PCP up to 112 mcg, patient did not tolerate.  She is currently on 88 mcg p.o. daily before breakfast.  Her previsit thyroid function tests are consistent with appropriate replacement.  She is advised to remain on her current dose of 88 mcg until next measurement.    - We discussed about the correct intake of her thyroid hormone, on empty stomach at fasting, with water, separated by at least 30 minutes from breakfast and  other medications,  and separated by more than 4 hours from calcium, iron, multivitamins, acid reflux medications (PPIs). -Patient is made aware of the fact that thyroid hormone replacement is needed for life, dose to be adjusted by periodic monitoring of thyroid function tests.   6) Chronic Care/Health Maintenance:  -she  is on Statin medications and  is encouraged to continue to follow up with Ophthalmology, Dentist,  Podiatrist at least yearly or according to recommendations, and advised to   stay away from smoking. I have recommended yearly flu vaccine and pneumonia vaccination at least every 5 years; moderate intensity exercise for up to 150 minutes weekly; and  sleep for at least 7 hours a day.  - I advised patient to maintain close follow up with Zhou-Talbert, Elwyn Lade, MD for primary care needs.    - Time spent on this patient care encounter:  30 minutes of which 50% was spent in  counseling and the rest reviewing  her current and  previous labs / studies and medications  doses and developing a plan for long term care. Madeline Fox  participated in the discussions, expressed understanding, and voiced agreement with the above plans.  All questions were answered to her satisfaction. she is encouraged to contact clinic should she have any questions or concerns prior to her return visit.    Follow up plan: - Return in about 3 months (around 01/04/2020) for F/U with Pre-visit Labs, NV A1c in Office.  Glade Lloyd, MD [facility] [address] [place], [place] [address] Phone: [phone]  Fax: [phone]    10/05/2019, 3:22 PM  This note was partially dictated with voice recognition software. Similar sounding words can be transcribed inadequately or may not  be corrected upon review.

## 2019-11-07 ENCOUNTER — Other Ambulatory Visit: Payer: Self-pay

## 2019-11-07 DIAGNOSIS — N183 Chronic kidney disease, stage 3 unspecified: Secondary | ICD-10-CM

## 2019-11-07 DIAGNOSIS — E1122 Type 2 diabetes mellitus with diabetic chronic kidney disease: Secondary | ICD-10-CM

## 2019-11-07 MED ORDER — ACCU-CHEK FASTCLIX LANCETS MISC: 1 refills | Status: DC

## 2019-11-07 MED ORDER — PIOGLITAZONE HCL 15 MG PO TABS: 15.0000 mg | ORAL_TABLET | Freq: Every day | ORAL | 1 refills | Status: DC

## 2019-12-14 ENCOUNTER — Other Ambulatory Visit: Payer: Self-pay | Admitting: "Endocrinology

## 2019-12-15 NOTE — Telephone Encounter (Signed)
Please davise, I don't see testing frequency in last OV.

## 2019-12-28 ENCOUNTER — Other Ambulatory Visit: Payer: Self-pay | Admitting: "Endocrinology

## 2019-12-28 LAB — TSH: TSH: 5.22 mIU/L — ABNORMAL HIGH (ref 0.40–4.50)

## 2019-12-28 LAB — T4, FREE: Free T4: 1.2 ng/dL (ref 0.8–1.8)

## 2020-01-04 ENCOUNTER — Ambulatory Visit: Payer: Medicare HMO | Admitting: Nurse Practitioner

## 2020-01-04 ENCOUNTER — Other Ambulatory Visit: Payer: Self-pay

## 2020-01-04 ENCOUNTER — Ambulatory Visit: Payer: Medicare HMO | Admitting: "Endocrinology

## 2020-01-04 ENCOUNTER — Encounter: Payer: Self-pay | Admitting: Nurse Practitioner

## 2020-01-04 VITALS — BP 140/54 | HR 74 | Ht 63.0 in | Wt 172.0 lb

## 2020-01-04 DIAGNOSIS — E782 Mixed hyperlipidemia: Secondary | ICD-10-CM | POA: Diagnosis not present

## 2020-01-04 DIAGNOSIS — N1831 Chronic kidney disease, stage 3a: Secondary | ICD-10-CM

## 2020-01-04 DIAGNOSIS — E1122 Type 2 diabetes mellitus with diabetic chronic kidney disease: Secondary | ICD-10-CM | POA: Diagnosis not present

## 2020-01-04 DIAGNOSIS — I10 Essential (primary) hypertension: Secondary | ICD-10-CM

## 2020-01-04 DIAGNOSIS — E890 Postprocedural hypothyroidism: Secondary | ICD-10-CM

## 2020-01-04 LAB — POCT GLYCOSYLATED HEMOGLOBIN (HGB A1C): HbA1c, POC (controlled diabetic range): 7.1 % — AB (ref 0.0–7.0)

## 2020-01-04 LAB — POCT UA - MICROALBUMIN: Microalbumin Ur, POC: 10 mg/L

## 2020-01-04 NOTE — Progress Notes (Signed)
01/04/2020, 4:28 PM                            Endocrinology Follow Up Visit  Subjective:    Patient ID: Madeline Fox, female    DOB: 1945-07-11.  Madeline Fox is being engaged in follow-up  for management of currently controlled symptomatic type 2 diabetes, postsurgical hypothyroidism, hyperlipidemia. PMD:   Zhou-Talbert, Elwyn Lade, MD.   Past Medical History:  Diagnosis Date  . Anemia    iron deficiency anemia  . Anxiety   . Arthritis   . Breast cancer ([place]) 2007   LT LUMPECTOMY  . Cancer ([place]) 2007   L BREAST CANCER - CHEMO/RADIATION /LUMPECTOMY    . Chronic kidney disease    stage 3  . Diabetes mellitus without complication (HCC)    Type 2  . Diverticulosis of colon with hemorrhage   . Goiter   . Heart murmur   . Hyperlipidemia   . Hypertension   . Hyperthyroidism   . Hypothyroidism    s/p thyroid ablation  . Internal hemorrhoids   . Neuromuscular disorder ([place])    NEUROPATHY IN TOES  . Nocturia   . Ovarian carcinoma in situ   . Personal history of chemotherapy   . Personal history of radiation therapy   . Pneumonia   . Psoriasis   . S/P chemotherapy, time since greater than 12 weeks 2007   BREAST CA  . S/P radiation > 12 weeks 2007   BREAST CA  . Thrombocytopenia ([place])   . Wears dentures    partial upper   Past Surgical History:  Procedure Laterality Date  . ABDOMINAL HYSTERECTOMY    . APPENDECTOMY    . BREAST BIOPSY Left 2007   POS  . BREAST LUMPECTOMY    . CATARACT EXTRACTION W/PHACO Right 09/29/2017   Procedure: CATARACT EXTRACTION PHACO AND INTRAOCULAR LENS PLACEMENT ([place])  RIGHT DIABETIC;  Surgeon: Leandrew Koyanagi, MD;  Location: [facility];  Service: Ophthalmology;  Laterality: Right;  Diabetic - oral meds  . CATARACT EXTRACTION W/PHACO Left 02/09/2018   Procedure: CATARACT EXTRACTION PHACO AND INTRAOCULAR LENS PLACEMENT ([place]) LEFT DIABETIC;   Surgeon: Leandrew Koyanagi, MD;  Location: [facility];  Service: Ophthalmology;  Laterality: Left;  . CHOLECYSTECTOMY    . COLONOSCOPY WITH PROPOFOL N/A 04/12/2015   Procedure: COLONOSCOPY WITH PROPOFOL;  Surgeon: Hulen Luster, MD;  Location[facility];  Service: Gastroenterology;  Laterality: N/A;  . FINE NEEDLE ASPIRATION Left    left upper lobe thyroid - benign  . MASTECTOMY PARTIAL / LUMPECTOMY Left 2007   chemo and radiation  . OOPHORECTOMY     for carcinoma in situ  . THYROIDECTOMY  11/19/2011   Procedure: THYROIDECTOMY;  Surgeon: Earnstine Regal, MD;  Location: [facility];  Service: General;  Laterality: N/A;  Total Thyroidectomy  . THYROIDECTOMY     Social History   Socioeconomic History  . Marital status: Married    Spouse name: Not on file  . Number of children: Not on file  . Years of education: Not on  file  . Highest education level: Not on file  Occupational History  . Not on file  Tobacco Use  . Smoking status: Never Smoker  . Smokeless tobacco: Never Used  Vaping Use  . Vaping Use: Never used  Substance and Sexual Activity  . Alcohol use: No  . Drug use: No  . Sexual activity: Not on file  Other Topics Concern  . Not on file  Social History Narrative  . Not on file   Social Determinants of Health   Financial Resource Strain: Not on file  Food Insecurity: Not on file  Transportation Needs: Not on file  Physical Activity: Not on file  Stress: Not on file  Social Connections: Not on file   Outpatient Encounter Medications as of 01/04/2020  Medication Sig  . ACCU-CHEK AVIVA PLUS test strip USE UP TO 2-3 TIMES DAILY AS DIRECTED  . Accu-Chek FastClix Lancets MISC Pt  Test BG tid  . Alcohol Swabs (B-D SINGLE USE SWABS REGULAR) PADS USE TWICE DAILY  . ALPRAZolam (NIRAVAM) 0.25 MG dissolvable tablet Take 0.25 mg by mouth every morning.   Marland Kitchen aspirin 81 MG tablet Take 81 mg by mouth every morning.   . blood glucose meter kit and supplies KIT Dispense  based on patient and insurance preference. Use up to 2-3 times daily as directed. (FOR ICD-10 E11.65).  . cetirizine (ZYRTEC) 10 MG tablet Take 10 mg by mouth at bedtime.  . Cholecalciferol (VITAMIN D3) 1000 units CAPS Take daily by mouth.  . enalapril (VASOTEC) 2.5 MG tablet 1 tablet  . ferrous sulfate 325 (65 FE) MG tablet Take 325 mg by mouth 2 (two) times daily.  . fluticasone (FLONASE) 50 MCG/ACT nasal spray Place 1 spray into both nostrils 2 (two) times daily.  Marland Kitchen levothyroxine (SYNTHROID) 88 MCG tablet Take 1 tablet (88 mcg total) by mouth daily before breakfast.  . lovastatin (MEVACOR) 40 MG tablet Take 40 mg by mouth at bedtime.   . Omega-3 Fatty Acids (FISH OIL) 1000 MG CAPS Take daily by mouth.  Marland Kitchen omeprazole (PRILOSEC) 40 MG capsule Take 40 mg daily by mouth.  . pioglitazone (ACTOS) 15 MG tablet Take 1 tablet (15 mg total) by mouth daily.  . sitaGLIPtin (JANUVIA) 50 MG tablet Take 50 mg by mouth daily.  . vitamin B-12 (CYANOCOBALAMIN) 1000 MCG tablet Take 1,000 mcg by mouth daily.   No facility-administered encounter medications on file as of 01/04/2020.    ALLERGIES: Allergies  Allergen Reactions  . Augmentin [Amoxicillin-Pot Clavulanate]     nervousness    VACCINATION STATUS: There is no immunization history for the selected administration types on file for this patient.  Diabetes She presents for her follow-up diabetic visit. She has type 2 diabetes mellitus. Onset time: She was diagnosed at approximate age of 36 years. Denies any history of gestational diabetes. Her disease course has been stable. There are no hypoglycemic associated symptoms. Pertinent negatives for hypoglycemia include no confusion, headaches, pallor or seizures. There are no diabetic associated symptoms. Pertinent negatives for diabetes include no chest pain, no fatigue, no polydipsia, no polyphagia and no polyuria. There are no hypoglycemic complications. Symptoms are stable. Diabetic complications  include nephropathy. Risk factors for coronary artery disease include diabetes mellitus, dyslipidemia, family history, hypertension, obesity, sedentary lifestyle and post-menopausal. Current diabetic treatment includes oral agent (dual therapy). She is compliant with treatment most of the time. Her weight is increasing steadily. She is following a generally unhealthy diet. When asked about meal planning, she reported  none. She has not had a previous visit with a dietitian. She never participates in exercise. Her home blood glucose trend is fluctuating minimally. (She present today, accompanied by her daughter, with no meter or logs to review.  She does not routinely monitor glucose at home, as she was advised she did not need to.  Her POCT A1c today is 7.1%, essentially unchanged from previous visit of 6.9%.  She denies any s/s of hypoglycemia.) An ACE inhibitor/angiotensin II receptor blocker is not being taken. She does not see a podiatrist.Eye exam is current.  Hyperlipidemia This is a chronic problem. The current episode started more than 1 year ago. The problem is controlled. Exacerbating diseases include chronic renal disease, diabetes, hypothyroidism and obesity. Factors aggravating her hyperlipidemia include fatty foods. Pertinent negatives include no chest pain, myalgias or shortness of breath. Current antihyperlipidemic treatment includes statins. The current treatment provides mild improvement of lipids. There are no compliance problems.  Risk factors for coronary artery disease include diabetes mellitus, dyslipidemia, family history, hypertension, obesity, a sedentary lifestyle and post-menopausal.  Hypertension This is a chronic problem. The current episode started more than 1 year ago. The problem has been gradually improving since onset. The problem is controlled. Pertinent negatives include no chest pain, headaches, palpitations or shortness of breath. Agents associated with hypertension include  thyroid hormones. Risk factors for coronary artery disease include diabetes mellitus, dyslipidemia, obesity, sedentary lifestyle and post-menopausal state. Past treatments include ACE inhibitors. The current treatment provides moderate improvement. There are no compliance problems.  Hypertensive end-organ damage includes kidney disease. Identifiable causes of hypertension include chronic renal disease and a thyroid problem.   Review of systems  Constitutional: + steadily increasing body weight,  current Body mass index is 30.47 kg/m. , no fatigue, no subjective hyperthermia, no subjective hypothermia Eyes: no blurry vision, no xerophthalmia ENT: no sore throat, no nodules palpated in throat, no dysphagia/odynophagia, no hoarseness Cardiovascular: no chest pain, no shortness of breath, no palpitations, no leg swelling Respiratory: no cough, no shortness of breath Gastrointestinal: no nausea/vomiting/diarrhea Musculoskeletal: c/o bilateral knee pain (arthritis) Skin: no rashes, no hyperemia Neurological: no tremors, no numbness, no tingling, no dizziness Psychiatric: no depression, no anxiety  Objective:    BP (!) 140/54 (BP Location: Right Arm)   Pulse 74   Ht 5' 3"  (1.6 m)   Wt 172 lb (78 kg)   BMI 30.47 kg/m   Wt Readings from Last 3 Encounters:  01/04/20 172 lb (78 kg)  10/05/19 162 lb (73.5 kg)  02/15/18 162 lb (73.5 kg)    BP Readings from Last 3 Encounters:  01/04/20 (!) 140/54  10/05/19 (!) 126/47  02/15/18 136/77     Physical Exam- Limited  Constitutional:  Body mass index is 30.47 kg/m. , not in acute distress, normal state of mind Eyes:  EOMI, no exophthalmos Neck: Supple Thyroid: No gross goiter Cardiovascular: RRR, no murmers, rubs, or gallops, no edema Respiratory: Adequate breathing efforts, no crackles, rales, rhonchi, or wheezing Musculoskeletal: no gross deformities, strength intact in all four extremities, no gross restriction of joint movements Skin:   no rashes, no hyperemia Neurological: no tremor with outstretched hands   POCT ABI Results 01/04/20   Right ABI:  1.34      Left ABI:  1.32  Right leg systolic / diastolic: 355/97 mmHg Left leg systolic / diastolic: 416/38 mmHg  Arm systolic / diastolic: 453/64 mmHG  Detailed report will be scanned into patient chart.    Recent Results (from  the past 2160 hour(s))  TSH     Status: Abnormal   Collection Time: 12/28/19 10:02 AM  Result Value Ref Range   TSH 5.22 (H) 0.40 - 4.50 mIU/L  T4, free     Status: None   Collection Time: 12/28/19 10:02 AM  Result Value Ref Range   Free T4 1.2 0.8 - 1.8 ng/dL  POCT UA - Microalbumin     Status: Normal   Collection Time: 01/04/20  4:01 PM  Result Value Ref Range   Microalbumin Ur, POC 10 mg/L   Creatinine, POC     Albumin/Creatinine Ratio, Urine, POC    HgB A1c     Status: Abnormal   Collection Time: 01/04/20  4:01 PM  Result Value Ref Range   Hemoglobin A1C     HbA1c POC (<> result, manual entry)     HbA1c, POC (prediabetic range)     HbA1c, POC (controlled diabetic range) 7.1 (A) 0.0 - 7.0 %   Lipid Panel     Component Value Date/Time   CHOL 149 11/03/2017 1512   TRIG 86 11/03/2017 1512   HDL 53 11/03/2017 1512   CHOLHDL 2.8 11/03/2017 1512   LDLCALC 79 11/03/2017 1512      Assessment & Plan:   1) 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  She present today, accompanied by her daughter, with no meter or logs to review.  She does not routinely monitor glucose at home, as she was advised she did not need to.  Her POCT A1c today is 7.1%, essentially unchanged from previous visit of 6.9%.  She denies any s/s of hypoglycemia.    -her diabetes is complicated by stage 3 renal insufficiency which is improving, obesity/sedentary life and Madeline Fox remains at a  high risk for more acute and chronic complications which include CAD, CVA, CKD, retinopathy, and neuropathy. These are all discussed in detail with the patient.  - Nutritional counseling repeated at each appointment due to patients tendency to fall back in to old habits.  - The patient admits there is a room for improvement in their diet and drink choices. -  Suggestion is made for the patient to avoid simple carbohydrates from their diet including Cakes, Sweet Desserts / Pastries, Ice Cream, Soda (diet and regular), Sweet Tea, Candies, Chips, Cookies, Sweet Pastries,  Store Bought Juices, Alcohol in Excess of  1-2 drinks a day, Artificial Sweeteners, Coffee Creamer, and "Sugar-free" Products. This will help patient to have stable blood glucose profile and potentially avoid unintended weight gain.   - I encouraged the patient to switch to  unprocessed or minimally processed complex starch and increased protein intake (animal or plant source), fruits, and vegetables.   - Patient is advised to stick to a routine mealtimes to eat 3 meals  a day and avoid unnecessary snacks ( to snack only to correct hypoglycemia).  - I have approached her with the following individualized plan to manage diabetes and patient agrees:   - Based on her stable glycemic profile, she will not require any additional treatment.  She is advised to continue Januvia 50 mg p.o. daily at breakfast and Actos 15 mg p.o. daily at breakfast.  - Patient specific target  A1c;  LDL, HDL, Triglycerides, and  Waist Circumference were discussed in detail.  2) BP/HTN: Her blood pressure is  controlled to target for her age.  She is advised to continue Enalapril 2.5 mg po daily.  3) Lipids/HPL:  She does not have recent lipid panel to review.  She is advised to continue Lovastatin 40 mg po daily at bedtime.  Side effects and precautions discussed with her.  4)  Weight/Diet:  Her Body mass index is 30.47 kg/m.-- not a candidate for major  weight loss.  CDE Consult has been  initiated , exercise, and detailed carbohydrates information provided.  5) Postsurgical hypothyroidism - She underwent total thyroidectomy for large multinodular goiter in 2013.  In the interval, her thyroid hormone was adjusted by her PCP up to 112 mcg, patient did not tolerate.  She is currently on 88 mcg p.o. daily before breakfast.    -Her previsit TFTs are consistent with slight under replacement.  She did not tolerate increasing her levothyroxine dose in the past.  She is advised to continue Levothyroxine 88 mcg po daily before breakfast and on Saturdays take 1.5 pills.   - We discussed about the correct intake of her thyroid hormone, on empty stomach at fasting, with water, separated by at least 30 minutes from breakfast and other medications,  and separated by more than 4 hours from calcium, iron, multivitamins, acid reflux medications (PPIs). -Patient is made aware of the fact that thyroid hormone replacement is needed for life, dose to be adjusted by periodic monitoring of thyroid function tests.  6) Chronic Care/Health Maintenance: -she is on ACE and on Statin medications and  is encouraged to continue to follow up with Ophthalmology, Dentist,  Podiatrist at least yearly or according to recommendations, and advised to   stay away from smoking. I have recommended yearly flu vaccine and pneumonia vaccination at least every 5 years; moderate intensity exercise for up to 150 minutes weekly; and  sleep for at least 7 hours a day.  - I advised patient to maintain close follow up with Zhou-Talbert, Elwyn Lade, MD for primary care needs.  - Time spent on this patient care encounter:  35 min, of which > 50% was spent in  counseling and the rest reviewing her blood glucose logs , discussing her hypoglycemia and hyperglycemia episodes, reviewing her current and  previous labs / studies  ( including abstraction from other facilities) and medications  doses and  developing a  long term treatment plan and documenting her care.   Please refer to Patient Instructions for Blood Glucose Monitoring and Insulin/Medications Dosing Guide"  in media tab for additional information. Please  also refer to " Patient Self Inventory" in the Media  tab for reviewed elements of pertinent patient history.  Madeline Fox participated in the discussions, expressed understanding, and voiced agreement with the above plans.  All questions were answered to her satisfaction. she is encouraged to contact clinic should she have any questions or concerns prior to her return visit.    Follow up plan: - Return in about 1 year (around 01/03/2021) for Diabetes follow up with A1c in office, Thyroid follow up, Previsit labs, Bring glucometer and logs.  Madeline Fox, [facility] [address] [place], [place] [address] Phone: [phone] Fax: [phone]   01/04/2020, 4:28 PM

## 2020-01-04 NOTE — Patient Instructions (Signed)

## 2020-02-26 ENCOUNTER — Telehealth: Payer: Self-pay | Admitting: Nurse Practitioner

## 2020-02-26 NOTE — Telephone Encounter (Signed)
Pt was seen in December by Loree Fee and she is on 54mcg of synthroid and she is unsure what MCG she is supposed to be taken because she thinks it was supposed to be increased  [phone]

## 2020-02-26 NOTE — Telephone Encounter (Signed)
Discussed with pt, understanding voiced. 

## 2020-02-26 NOTE — Telephone Encounter (Signed)
Faxed lab orders to [facility] per pt request.

## 2020-02-26 NOTE — Telephone Encounter (Signed)
I remember she could not tolerate 112 mcg previously, perhaps she is just sensitive to the medication. Let's go ahead and recheck her thyroid labs TSH and Free T4 now and then I can better see what is going on and make adjustments accordingly.

## 2020-02-26 NOTE — Telephone Encounter (Signed)
Pt is taking levothyroxin 68mcg (1 pill) daily except for Saturdays she takes 1.5 pills. States she's experiencing increased anxiety, throat feels tight, "I'm just not myself". States she thinks her medication needs adjusting.

## 2020-02-27 ENCOUNTER — Other Ambulatory Visit: Payer: Self-pay | Admitting: Nurse Practitioner

## 2020-02-28 LAB — COMPLETE METABOLIC PANEL WITH GFR
AG Ratio: 1.5 (calc) (ref 1.0–2.5)
ALT: 9 U/L (ref 6–29)
AST: 18 U/L (ref 10–35)
Albumin: 4.1 g/dL (ref 3.6–5.1)
Alkaline phosphatase (APISO): 64 U/L (ref 37–153)
BUN/Creatinine Ratio: 16 (calc) (ref 6–22)
BUN: 19 mg/dL (ref 7–25)
CO2: 29 mmol/L (ref 20–32)
Calcium: 9.1 mg/dL (ref 8.6–10.4)
Chloride: 94 mmol/L — ABNORMAL LOW (ref 98–110)
Creat: 1.18 mg/dL — ABNORMAL HIGH (ref 0.60–0.93)
GFR, Est African American: 52 mL/min/{1.73_m2} — ABNORMAL LOW (ref >=60)
GFR, Est Non African American: 45 mL/min/{1.73_m2} — ABNORMAL LOW (ref >=60)
Globulin: 2.7 g/dL (calc) (ref 1.9–3.7)
Glucose, Bld: 137 mg/dL — ABNORMAL HIGH (ref 65–99)
Potassium: 4.9 mmol/L (ref 3.5–5.3)
Sodium: 128 mmol/L — ABNORMAL LOW (ref 135–146)
Total Bilirubin: 0.5 mg/dL (ref 0.2–1.2)
Total Protein: 6.8 g/dL (ref 6.1–8.1)

## 2020-02-28 LAB — TSH+FREE T4
Free T4: 1.3 ng/dL (ref 0.8–1.8)
TSH: 3.12 mIU/L (ref 0.40–4.50)

## 2020-03-18 ENCOUNTER — Other Ambulatory Visit: Payer: Self-pay | Admitting: Family Medicine

## 2020-03-18 DIAGNOSIS — Z1231 Encounter for screening mammogram for malignant neoplasm of breast: Secondary | ICD-10-CM

## 2020-04-01 ENCOUNTER — Other Ambulatory Visit: Payer: Self-pay | Admitting: "Endocrinology

## 2020-04-01 DIAGNOSIS — E1122 Type 2 diabetes mellitus with diabetic chronic kidney disease: Secondary | ICD-10-CM

## 2020-04-01 DIAGNOSIS — N183 Chronic kidney disease, stage 3 unspecified: Secondary | ICD-10-CM

## 2020-04-24 ENCOUNTER — Other Ambulatory Visit: Payer: Self-pay

## 2020-04-24 ENCOUNTER — Ambulatory Visit
Admission: RE | Admit: 2020-04-24 | Discharge: 2020-04-24 | Disposition: A | Discharge status: Home / Self Care | Payer: Medicare HMO | Source: Ambulatory Visit | Attending: Family Medicine | Admitting: Family Medicine

## 2020-04-24 DIAGNOSIS — Z1231 Encounter for screening mammogram for malignant neoplasm of breast: Secondary | ICD-10-CM | POA: Diagnosis present

## 2020-06-03 ENCOUNTER — Other Ambulatory Visit: Payer: Self-pay | Admitting: Nurse Practitioner

## 2020-07-15 ENCOUNTER — Other Ambulatory Visit: Payer: Self-pay | Admitting: "Endocrinology

## 2020-08-05 ENCOUNTER — Emergency Department (HOSPITAL_COMMUNITY): Payer: Medicare HMO

## 2020-08-05 ENCOUNTER — Inpatient Hospital Stay (HOSPITAL_COMMUNITY)
Admission: EM | Admit: 2020-08-05 | Discharge: 2020-08-08 | DRG: 481 | Disposition: A | Discharge status: Home Health | Payer: Medicare HMO | Attending: Family Medicine | Admitting: Family Medicine

## 2020-08-05 ENCOUNTER — Encounter (HOSPITAL_COMMUNITY): Payer: Self-pay | Admitting: Emergency Medicine

## 2020-08-05 ENCOUNTER — Other Ambulatory Visit: Payer: Self-pay

## 2020-08-05 DIAGNOSIS — Z79890 Hormone replacement therapy (postmenopausal): Secondary | ICD-10-CM

## 2020-08-05 DIAGNOSIS — W010XXA Fall on same level from slipping, tripping and stumbling without subsequent striking against object, initial encounter: Secondary | ICD-10-CM | POA: Diagnosis present

## 2020-08-05 DIAGNOSIS — Z9012 Acquired absence of left breast and nipple: Secondary | ICD-10-CM

## 2020-08-05 DIAGNOSIS — Z9221 Personal history of antineoplastic chemotherapy: Secondary | ICD-10-CM

## 2020-08-05 DIAGNOSIS — Z8249 Family history of ischemic heart disease and other diseases of the circulatory system: Secondary | ICD-10-CM

## 2020-08-05 DIAGNOSIS — E871 Hypo-osmolality and hyponatremia: Secondary | ICD-10-CM | POA: Diagnosis not present

## 2020-08-05 DIAGNOSIS — D631 Anemia in chronic kidney disease: Secondary | ICD-10-CM | POA: Diagnosis present

## 2020-08-05 DIAGNOSIS — Z923 Personal history of irradiation: Secondary | ICD-10-CM

## 2020-08-05 DIAGNOSIS — E782 Mixed hyperlipidemia: Secondary | ICD-10-CM | POA: Diagnosis present

## 2020-08-05 DIAGNOSIS — N1831 Chronic kidney disease, stage 3a: Secondary | ICD-10-CM | POA: Diagnosis present

## 2020-08-05 DIAGNOSIS — E785 Hyperlipidemia, unspecified: Secondary | ICD-10-CM | POA: Diagnosis present

## 2020-08-05 DIAGNOSIS — N1832 Chronic kidney disease, stage 3b: Secondary | ICD-10-CM | POA: Diagnosis present

## 2020-08-05 DIAGNOSIS — Z20822 Contact with and (suspected) exposure to covid-19: Secondary | ICD-10-CM | POA: Diagnosis present

## 2020-08-05 DIAGNOSIS — Z803 Family history of malignant neoplasm of breast: Secondary | ICD-10-CM

## 2020-08-05 DIAGNOSIS — Z66 Do not resuscitate: Secondary | ICD-10-CM | POA: Diagnosis present

## 2020-08-05 DIAGNOSIS — Z961 Presence of intraocular lens: Secondary | ICD-10-CM | POA: Diagnosis present

## 2020-08-05 DIAGNOSIS — E1122 Type 2 diabetes mellitus with diabetic chronic kidney disease: Secondary | ICD-10-CM | POA: Diagnosis present

## 2020-08-05 DIAGNOSIS — D649 Anemia, unspecified: Secondary | ICD-10-CM | POA: Diagnosis present

## 2020-08-05 DIAGNOSIS — Y92009 Unspecified place in unspecified non-institutional (private) residence as the place of occurrence of the external cause: Secondary | ICD-10-CM

## 2020-08-05 DIAGNOSIS — E890 Postprocedural hypothyroidism: Secondary | ICD-10-CM | POA: Diagnosis present

## 2020-08-05 DIAGNOSIS — Z90710 Acquired absence of both cervix and uterus: Secondary | ICD-10-CM

## 2020-08-05 DIAGNOSIS — I129 Hypertensive chronic kidney disease with stage 1 through stage 4 chronic kidney disease, or unspecified chronic kidney disease: Secondary | ICD-10-CM | POA: Diagnosis present

## 2020-08-05 DIAGNOSIS — Z7982 Long term (current) use of aspirin: Secondary | ICD-10-CM

## 2020-08-05 DIAGNOSIS — S72144A Nondisplaced intertrochanteric fracture of right femur, initial encounter for closed fracture: Secondary | ICD-10-CM | POA: Diagnosis not present

## 2020-08-05 DIAGNOSIS — Z86002 Personal history of in-situ neoplasm of other and unspecified genital organs: Secondary | ICD-10-CM

## 2020-08-05 DIAGNOSIS — Z7984 Long term (current) use of oral hypoglycemic drugs: Secondary | ICD-10-CM

## 2020-08-05 DIAGNOSIS — Z853 Personal history of malignant neoplasm of breast: Secondary | ICD-10-CM

## 2020-08-05 DIAGNOSIS — M170 Bilateral primary osteoarthritis of knee: Secondary | ICD-10-CM | POA: Diagnosis present

## 2020-08-05 DIAGNOSIS — W19XXXA Unspecified fall, initial encounter: Secondary | ICD-10-CM

## 2020-08-05 DIAGNOSIS — Z9049 Acquired absence of other specified parts of digestive tract: Secondary | ICD-10-CM

## 2020-08-05 DIAGNOSIS — Z9841 Cataract extraction status, right eye: Secondary | ICD-10-CM

## 2020-08-05 DIAGNOSIS — Z79899 Other long term (current) drug therapy: Secondary | ICD-10-CM

## 2020-08-05 DIAGNOSIS — S72001A Fracture of unspecified part of neck of right femur, initial encounter for closed fracture: Secondary | ICD-10-CM | POA: Diagnosis present

## 2020-08-05 DIAGNOSIS — Z880 Allergy status to penicillin: Secondary | ICD-10-CM

## 2020-08-05 DIAGNOSIS — F419 Anxiety disorder, unspecified: Secondary | ICD-10-CM | POA: Diagnosis present

## 2020-08-05 DIAGNOSIS — I10 Essential (primary) hypertension: Secondary | ICD-10-CM | POA: Diagnosis present

## 2020-08-05 DIAGNOSIS — Z9842 Cataract extraction status, left eye: Secondary | ICD-10-CM

## 2020-08-05 HISTORY — DX: Other complications of anesthesia, initial encounter: T88.59XA

## 2020-08-05 MED ORDER — FENTANYL CITRATE (PF) 100 MCG/2ML IJ SOLN
50.0000 ug | INTRAMUSCULAR | Status: DC | PRN
  Administered 2020-08-05: 50 ug via INTRAVENOUS
  Filled 2020-08-05: qty 2

## 2020-08-05 NOTE — ED Triage Notes (Signed)
Pt tripped over air mattress and fell onto her right side. Pt c/o right hip/thigh pain.

## 2020-08-05 NOTE — ED Notes (Signed)
Patient transported to X-ray 

## 2020-08-06 ENCOUNTER — Encounter (HOSPITAL_COMMUNITY): Payer: Self-pay | Admitting: Family Medicine

## 2020-08-06 ENCOUNTER — Emergency Department (HOSPITAL_COMMUNITY): Payer: Medicare HMO

## 2020-08-06 ENCOUNTER — Inpatient Hospital Stay (HOSPITAL_COMMUNITY): Payer: Medicare HMO | Admitting: Anesthesiology

## 2020-08-06 ENCOUNTER — Inpatient Hospital Stay (HOSPITAL_COMMUNITY): Payer: Medicare HMO

## 2020-08-06 ENCOUNTER — Encounter (HOSPITAL_COMMUNITY)
Admission: EM | Disposition: A | Discharge status: Home Health | Payer: Self-pay | Source: Home / Self Care | Attending: Family Medicine

## 2020-08-06 DIAGNOSIS — Y92009 Unspecified place in unspecified non-institutional (private) residence as the place of occurrence of the external cause: Secondary | ICD-10-CM | POA: Diagnosis not present

## 2020-08-06 DIAGNOSIS — Z803 Family history of malignant neoplasm of breast: Secondary | ICD-10-CM | POA: Diagnosis not present

## 2020-08-06 DIAGNOSIS — E871 Hypo-osmolality and hyponatremia: Secondary | ICD-10-CM | POA: Diagnosis present

## 2020-08-06 DIAGNOSIS — Z9841 Cataract extraction status, right eye: Secondary | ICD-10-CM | POA: Diagnosis not present

## 2020-08-06 DIAGNOSIS — W010XXA Fall on same level from slipping, tripping and stumbling without subsequent striking against object, initial encounter: Secondary | ICD-10-CM | POA: Diagnosis present

## 2020-08-06 DIAGNOSIS — Z853 Personal history of malignant neoplasm of breast: Secondary | ICD-10-CM | POA: Diagnosis not present

## 2020-08-06 DIAGNOSIS — E782 Mixed hyperlipidemia: Secondary | ICD-10-CM | POA: Diagnosis present

## 2020-08-06 DIAGNOSIS — E119 Type 2 diabetes mellitus without complications: Secondary | ICD-10-CM

## 2020-08-06 DIAGNOSIS — Z90710 Acquired absence of both cervix and uterus: Secondary | ICD-10-CM | POA: Diagnosis not present

## 2020-08-06 DIAGNOSIS — Z86002 Personal history of in-situ neoplasm of other and unspecified genital organs: Secondary | ICD-10-CM | POA: Diagnosis not present

## 2020-08-06 DIAGNOSIS — Z66 Do not resuscitate: Secondary | ICD-10-CM | POA: Diagnosis present

## 2020-08-06 DIAGNOSIS — Z20822 Contact with and (suspected) exposure to covid-19: Secondary | ICD-10-CM | POA: Diagnosis present

## 2020-08-06 DIAGNOSIS — E1122 Type 2 diabetes mellitus with diabetic chronic kidney disease: Secondary | ICD-10-CM | POA: Diagnosis present

## 2020-08-06 DIAGNOSIS — Z9842 Cataract extraction status, left eye: Secondary | ICD-10-CM | POA: Diagnosis not present

## 2020-08-06 DIAGNOSIS — S72001A Fracture of unspecified part of neck of right femur, initial encounter for closed fracture: Secondary | ICD-10-CM | POA: Diagnosis present

## 2020-08-06 DIAGNOSIS — I129 Hypertensive chronic kidney disease with stage 1 through stage 4 chronic kidney disease, or unspecified chronic kidney disease: Secondary | ICD-10-CM | POA: Diagnosis present

## 2020-08-06 DIAGNOSIS — W19XXXA Unspecified fall, initial encounter: Secondary | ICD-10-CM | POA: Diagnosis not present

## 2020-08-06 DIAGNOSIS — Z7984 Long term (current) use of oral hypoglycemic drugs: Secondary | ICD-10-CM | POA: Diagnosis not present

## 2020-08-06 DIAGNOSIS — S72144A Nondisplaced intertrochanteric fracture of right femur, initial encounter for closed fracture: Secondary | ICD-10-CM | POA: Diagnosis present

## 2020-08-06 DIAGNOSIS — Z9012 Acquired absence of left breast and nipple: Secondary | ICD-10-CM | POA: Diagnosis not present

## 2020-08-06 DIAGNOSIS — Z8249 Family history of ischemic heart disease and other diseases of the circulatory system: Secondary | ICD-10-CM | POA: Diagnosis not present

## 2020-08-06 DIAGNOSIS — Z9221 Personal history of antineoplastic chemotherapy: Secondary | ICD-10-CM | POA: Diagnosis not present

## 2020-08-06 DIAGNOSIS — D631 Anemia in chronic kidney disease: Secondary | ICD-10-CM | POA: Diagnosis present

## 2020-08-06 DIAGNOSIS — E890 Postprocedural hypothyroidism: Secondary | ICD-10-CM | POA: Diagnosis present

## 2020-08-06 DIAGNOSIS — N1832 Chronic kidney disease, stage 3b: Secondary | ICD-10-CM | POA: Diagnosis present

## 2020-08-06 DIAGNOSIS — E785 Hyperlipidemia, unspecified: Secondary | ICD-10-CM | POA: Diagnosis present

## 2020-08-06 DIAGNOSIS — Z923 Personal history of irradiation: Secondary | ICD-10-CM | POA: Diagnosis not present

## 2020-08-06 DIAGNOSIS — Z961 Presence of intraocular lens: Secondary | ICD-10-CM | POA: Diagnosis present

## 2020-08-06 DIAGNOSIS — Z9049 Acquired absence of other specified parts of digestive tract: Secondary | ICD-10-CM | POA: Diagnosis not present

## 2020-08-06 HISTORY — PX: INTRAMEDULLARY (IM) NAIL INTERTROCHANTERIC: SHX5875

## 2020-08-06 LAB — CBC WITH DIFFERENTIAL/PLATELET
Abs Immature Granulocytes: 0.05 10*3/uL (ref 0.00–0.07)
Basophils Absolute: 0 10*3/uL (ref 0.0–0.1)
Basophils Relative: 0 %
Eosinophils Absolute: 0.1 10*3/uL (ref 0.0–0.5)
Eosinophils Relative: 1 %
HCT: 31.3 % — ABNORMAL LOW (ref 36.0–46.0)
Hemoglobin: 10.4 g/dL — ABNORMAL LOW (ref 12.0–15.0)
Immature Granulocytes: 1 %
Lymphocytes Relative: 12 %
Lymphs Abs: 0.9 10*3/uL (ref 0.7–4.0)
MCH: 30.8 pg (ref 26.0–34.0)
MCHC: 33.2 g/dL (ref 30.0–36.0)
MCV: 92.6 fL (ref 80.0–100.0)
Monocytes Absolute: 0.4 10*3/uL (ref 0.1–1.0)
Monocytes Relative: 6 %
Neutro Abs: 5.5 10*3/uL (ref 1.7–7.7)
Neutrophils Relative %: 80 %
Platelets: 175 10*3/uL (ref 150–400)
RBC: 3.38 MIL/uL — ABNORMAL LOW (ref 3.87–5.11)
RDW: 13.6 % (ref 11.5–15.5)
WBC: 6.9 10*3/uL (ref 4.0–10.5)
nRBC: 0 % (ref 0.0–0.2)

## 2020-08-06 LAB — CBC
HCT: 31.2 % — ABNORMAL LOW (ref 36.0–46.0)
Hemoglobin: 10.3 g/dL — ABNORMAL LOW (ref 12.0–15.0)
MCH: 30.4 pg (ref 26.0–34.0)
MCHC: 33 g/dL (ref 30.0–36.0)
MCV: 92 fL (ref 80.0–100.0)
Platelets: 177 10*3/uL (ref 150–400)
RBC: 3.39 MIL/uL — ABNORMAL LOW (ref 3.87–5.11)
RDW: 13.6 % (ref 11.5–15.5)
WBC: 10.1 10*3/uL (ref 4.0–10.5)
nRBC: 0 % (ref 0.0–0.2)

## 2020-08-06 LAB — BASIC METABOLIC PANEL
Anion gap: 7 (ref 5–15)
Anion gap: 8 (ref 5–15)
BUN: 19 mg/dL (ref 8–23)
BUN: 21 mg/dL (ref 8–23)
CO2: 24 mmol/L (ref 22–32)
CO2: 26 mmol/L (ref 22–32)
Calcium: 8.4 mg/dL — ABNORMAL LOW (ref 8.9–10.3)
Calcium: 8.8 mg/dL — ABNORMAL LOW (ref 8.9–10.3)
Chloride: 91 mmol/L — ABNORMAL LOW (ref 98–111)
Chloride: 94 mmol/L — ABNORMAL LOW (ref 98–111)
Creatinine, Ser: 0.93 mg/dL (ref 0.44–1.00)
Creatinine, Ser: 1.11 mg/dL — ABNORMAL HIGH (ref 0.44–1.00)
GFR, Estimated: 51 mL/min — ABNORMAL LOW (ref >60)
GFR, Estimated: >60 mL/min (ref >60)
Glucose, Bld: 178 mg/dL — ABNORMAL HIGH (ref 70–99)
Glucose, Bld: 208 mg/dL — ABNORMAL HIGH (ref 70–99)
Potassium: 4 mmol/L (ref 3.5–5.1)
Potassium: 4.2 mmol/L (ref 3.5–5.1)
Sodium: 124 mmol/L — ABNORMAL LOW (ref 135–145)
Sodium: 126 mmol/L — ABNORMAL LOW (ref 135–145)

## 2020-08-06 LAB — GLUCOSE, CAPILLARY
Glucose-Capillary: 193 mg/dL — ABNORMAL HIGH (ref 70–99)
Glucose-Capillary: 147 mg/dL — ABNORMAL HIGH (ref 70–99)
Glucose-Capillary: 116 mg/dL — ABNORMAL HIGH (ref 70–99)
Glucose-Capillary: 160 mg/dL — ABNORMAL HIGH (ref 70–99)
Glucose-Capillary: 204 mg/dL — ABNORMAL HIGH (ref 70–99)

## 2020-08-06 LAB — RESP PANEL BY RT-PCR (FLU A&B, COVID) ARPGX2
Influenza A by PCR: NEGATIVE
Influenza B by PCR: NEGATIVE
SARS Coronavirus 2 by RT PCR: NEGATIVE

## 2020-08-06 LAB — TYPE AND SCREEN
ABO/RH(D): A POS
Antibody Screen: NEGATIVE

## 2020-08-06 LAB — OSMOLALITY: Osmolality: 279 mOsm/kg (ref 275–295)

## 2020-08-06 LAB — SURGICAL PCR SCREEN
MRSA, PCR: NEGATIVE
Staphylococcus aureus: NEGATIVE

## 2020-08-06 LAB — HEMOGLOBIN A1C
Hgb A1c MFr Bld: 7.1 % — ABNORMAL HIGH (ref 4.8–5.6)
Mean Plasma Glucose: 157.07 mg/dL

## 2020-08-06 LAB — PROTIME-INR
INR: 1 (ref 0.8–1.2)
Prothrombin Time: 13.1 seconds (ref 11.4–15.2)

## 2020-08-06 SURGERY — FIXATION, FRACTURE, INTERTROCHANTERIC, WITH INTRAMEDULLARY ROD
Anesthesia: General | Site: Hip | Laterality: Right

## 2020-08-06 MED ORDER — FENTANYL CITRATE (PF) 100 MCG/2ML IJ SOLN
25.0000 ug | INTRAMUSCULAR | Status: DC | PRN
  Administered 2020-08-06: 25 ug via INTRAVENOUS

## 2020-08-06 MED ORDER — FERROUS SULFATE 325 (65 FE) MG PO TABS
325.0000 mg | ORAL_TABLET | Freq: Two times a day (BID) | ORAL | Status: DC
  Administered 2020-08-06 – 2020-08-08 (×4): 325 mg via ORAL
  Filled 2020-08-06 (×4): qty 1

## 2020-08-06 MED ORDER — ONDANSETRON HCL 4 MG PO TABS: 4.0000 mg | ORAL_TABLET | Freq: Four times a day (QID) | ORAL | Status: DC | PRN

## 2020-08-06 MED ORDER — PHENOL 1.4 % MT LIQD: 1.0000 | OROMUCOSAL | Status: DC | PRN

## 2020-08-06 MED ORDER — SODIUM CHLORIDE 0.9 % IV SOLN
2.0000 g | Freq: Once | INTRAVENOUS | Status: AC
  Administered 2020-08-06: 2 g via INTRAVENOUS
  Filled 2020-08-06: qty 2

## 2020-08-06 MED ORDER — MENTHOL 3 MG MT LOZG: 1.0000 | LOZENGE | OROMUCOSAL | Status: DC | PRN

## 2020-08-06 MED ORDER — CEFAZOLIN SODIUM-DEXTROSE 2-4 GM/100ML-% IV SOLN
2.0000 g | INTRAVENOUS | Status: AC
  Administered 2020-08-06: 2 g via INTRAVENOUS
  Filled 2020-08-06: qty 100

## 2020-08-06 MED ORDER — ROCURONIUM BROMIDE 10 MG/ML (PF) SYRINGE
PREFILLED_SYRINGE | INTRAVENOUS | Status: DC | PRN
  Administered 2020-08-06: 60 mg via INTRAVENOUS
  Administered 2020-08-06: 40 mg via INTRAVENOUS

## 2020-08-06 MED ORDER — PIOGLITAZONE HCL 15 MG PO TABS: 15.0000 mg | ORAL_TABLET | Freq: Every day | ORAL | Status: DC

## 2020-08-06 MED ORDER — LEVOTHYROXINE SODIUM 88 MCG PO TABS
88.0000 ug | ORAL_TABLET | Freq: Every day | ORAL | Status: DC
  Administered 2020-08-06 – 2020-08-08 (×3): 88 ug via ORAL
  Filled 2020-08-06 (×3): qty 1

## 2020-08-06 MED ORDER — ONDANSETRON HCL 4 MG/2ML IJ SOLN: 4.0000 mg | Freq: Four times a day (QID) | INTRAMUSCULAR | Status: DC | PRN

## 2020-08-06 MED ORDER — ESMOLOL HCL 100 MG/10ML IV SOLN
INTRAVENOUS | Status: AC
  Filled 2020-08-06: qty 10

## 2020-08-06 MED ORDER — LACTATED RINGERS IV SOLN: INTRAVENOUS | Status: DC

## 2020-08-06 MED ORDER — VANCOMYCIN HCL IN DEXTROSE 1-5 GM/200ML-% IV SOLN: 1000.0000 mg | INTRAVENOUS | Status: DC

## 2020-08-06 MED ORDER — LIDOCAINE HCL (CARDIAC) PF 100 MG/5ML IV SOSY
PREFILLED_SYRINGE | INTRAVENOUS | Status: DC | PRN
  Administered 2020-08-06: 100 mg via INTRAVENOUS

## 2020-08-06 MED ORDER — FENTANYL CITRATE (PF) 100 MCG/2ML IJ SOLN
50.0000 ug | INTRAMUSCULAR | Status: DC | PRN
  Administered 2020-08-06 (×3): 50 ug via INTRAVENOUS
  Administered 2020-08-06: 25 ug via INTRAVENOUS
  Administered 2020-08-06 (×3): 50 ug via INTRAVENOUS
  Administered 2020-08-06: 25 ug via INTRAVENOUS
  Administered 2020-08-06 (×3): 50 ug via INTRAVENOUS
  Filled 2020-08-06 (×5): qty 2

## 2020-08-06 MED ORDER — LINAGLIPTIN 5 MG PO TABS: 5.0000 mg | ORAL_TABLET | Freq: Every day | ORAL | Status: DC

## 2020-08-06 MED ORDER — TRAZODONE HCL 50 MG PO TABS: 25.0000 mg | ORAL_TABLET | Freq: Every evening | ORAL | Status: DC | PRN

## 2020-08-06 MED ORDER — MORPHINE SULFATE (PF) 2 MG/ML IV SOLN
0.5000 mg | INTRAVENOUS | Status: DC | PRN
  Administered 2020-08-07 (×2): 0.5 mg via INTRAVENOUS
  Administered 2020-08-07: 1 mg via INTRAVENOUS
  Filled 2020-08-06 (×3): qty 1

## 2020-08-06 MED ORDER — SEVOFLURANE IN SOLN
RESPIRATORY_TRACT | Status: AC
  Filled 2020-08-06: qty 250

## 2020-08-06 MED ORDER — MAGNESIUM HYDROXIDE 400 MG/5ML PO SUSP: 30.0000 mL | Freq: Every day | ORAL | Status: DC | PRN

## 2020-08-06 MED ORDER — SODIUM CHLORIDE 0.9 % IV SOLN: INTRAVENOUS | Status: DC

## 2020-08-06 MED ORDER — DOCUSATE SODIUM 100 MG PO CAPS
100.0000 mg | ORAL_CAPSULE | Freq: Two times a day (BID) | ORAL | Status: DC
  Administered 2020-08-06 – 2020-08-08 (×4): 100 mg via ORAL
  Filled 2020-08-06 (×5): qty 1

## 2020-08-06 MED ORDER — ONDANSETRON HCL 4 MG/2ML IJ SOLN
INTRAMUSCULAR | Status: DC | PRN
  Administered 2020-08-06: 4 mg via INTRAVENOUS

## 2020-08-06 MED ORDER — TRAMADOL HCL 50 MG PO TABS
50.0000 mg | ORAL_TABLET | Freq: Four times a day (QID) | ORAL | Status: DC
  Administered 2020-08-06 – 2020-08-08 (×8): 50 mg via ORAL
  Filled 2020-08-06 (×8): qty 1

## 2020-08-06 MED ORDER — METOCLOPRAMIDE HCL 5 MG PO TABS
5.0000 mg | ORAL_TABLET | Freq: Three times a day (TID) | ORAL | Status: DC | PRN
  Filled 2020-08-06: qty 2

## 2020-08-06 MED ORDER — FLUTICASONE PROPIONATE 50 MCG/ACT NA SUSP
1.0000 | Freq: Two times a day (BID) | NASAL | Status: DC
  Administered 2020-08-06 – 2020-08-08 (×4): 1 via NASAL
  Filled 2020-08-06 (×2): qty 16

## 2020-08-06 MED ORDER — CHLORHEXIDINE GLUCONATE CLOTH 2 % EX PADS
6.0000 | MEDICATED_PAD | Freq: Every day | CUTANEOUS | Status: DC
  Administered 2020-08-07 – 2020-08-08 (×2): 6 via TOPICAL

## 2020-08-06 MED ORDER — PROPOFOL 10 MG/ML IV BOLUS
INTRAVENOUS | Status: DC | PRN
  Administered 2020-08-06 (×2): 50 mg via INTRAVENOUS
  Administered 2020-08-06: 100 mg via INTRAVENOUS

## 2020-08-06 MED ORDER — FENTANYL CITRATE (PF) 100 MCG/2ML IJ SOLN
INTRAMUSCULAR | Status: AC
  Filled 2020-08-06: qty 2

## 2020-08-06 MED ORDER — DEXAMETHASONE SODIUM PHOSPHATE 10 MG/ML IJ SOLN
INTRAMUSCULAR | Status: AC
  Filled 2020-08-06: qty 1

## 2020-08-06 MED ORDER — BUPIVACAINE-EPINEPHRINE (PF) 0.5% -1:200000 IJ SOLN
INTRAMUSCULAR | Status: AC
  Filled 2020-08-06: qty 60

## 2020-08-06 MED ORDER — SUCCINYLCHOLINE CHLORIDE 20 MG/ML IJ SOLN: INTRAMUSCULAR | Status: DC | PRN

## 2020-08-06 MED ORDER — INSULIN ASPART 100 UNIT/ML IJ SOLN
0.0000 [IU] | Freq: Three times a day (TID) | INTRAMUSCULAR | Status: DC
  Administered 2020-08-06: 2 [IU] via SUBCUTANEOUS
  Administered 2020-08-06 (×2): 3 [IU] via SUBCUTANEOUS
  Administered 2020-08-07: 2 [IU] via SUBCUTANEOUS
  Administered 2020-08-07: 3 [IU] via SUBCUTANEOUS
  Administered 2020-08-07 – 2020-08-08 (×3): 2 [IU] via SUBCUTANEOUS

## 2020-08-06 MED ORDER — ASPIRIN EC 325 MG PO TBEC
325.0000 mg | DELAYED_RELEASE_TABLET | Freq: Every day | ORAL | Status: DC
  Administered 2020-08-07 – 2020-08-08 (×2): 325 mg via ORAL
  Filled 2020-08-06 (×3): qty 1

## 2020-08-06 MED ORDER — POLYETHYLENE GLYCOL 3350 17 G PO PACK: 17.0000 g | PACK | Freq: Every day | ORAL | Status: DC | PRN

## 2020-08-06 MED ORDER — ROCURONIUM BROMIDE 10 MG/ML (PF) SYRINGE
PREFILLED_SYRINGE | INTRAVENOUS | Status: AC
  Filled 2020-08-06: qty 20

## 2020-08-06 MED ORDER — OMEGA-3-ACID ETHYL ESTERS 1 G PO CAPS
1.0000 g | ORAL_CAPSULE | Freq: Every day | ORAL | Status: DC
  Administered 2020-08-06 – 2020-08-08 (×3): 1 g via ORAL
  Filled 2020-08-06 (×3): qty 1

## 2020-08-06 MED ORDER — CEFAZOLIN SODIUM-DEXTROSE 2-4 GM/100ML-% IV SOLN
2.0000 g | Freq: Four times a day (QID) | INTRAVENOUS | Status: AC
  Administered 2020-08-06 – 2020-08-07 (×2): 2 g via INTRAVENOUS
  Filled 2020-08-06 (×2): qty 100

## 2020-08-06 MED ORDER — POVIDONE-IODINE 10 % EX SWAB: 2.0000 "application " | Freq: Once | CUTANEOUS | Status: DC

## 2020-08-06 MED ORDER — VITAMIN D 25 MCG (1000 UNIT) PO TABS
1000.0000 [IU] | ORAL_TABLET | Freq: Every day | ORAL | Status: DC
  Administered 2020-08-06 – 2020-08-08 (×3): 1000 [IU] via ORAL
  Filled 2020-08-06 (×3): qty 1

## 2020-08-06 MED ORDER — TRANEXAMIC ACID-NACL 1000-0.7 MG/100ML-% IV SOLN
INTRAVENOUS | Status: AC
  Filled 2020-08-06: qty 100

## 2020-08-06 MED ORDER — ESMOLOL HCL 100 MG/10ML IV SOLN
INTRAVENOUS | Status: AC
  Filled 2020-08-06: qty 20

## 2020-08-06 MED ORDER — SODIUM CHLORIDE 0.9 % IR SOLN
Status: DC | PRN
  Administered 2020-08-06: 1000 mL

## 2020-08-06 MED ORDER — ONDANSETRON HCL 4 MG/2ML IJ SOLN: 4.0000 mg | Freq: Once | INTRAMUSCULAR | Status: DC | PRN

## 2020-08-06 MED ORDER — VITAMIN B-12 1000 MCG PO TABS
1000.0000 ug | ORAL_TABLET | Freq: Every day | ORAL | Status: DC
  Administered 2020-08-06 – 2020-08-08 (×3): 1000 ug via ORAL
  Filled 2020-08-06 (×3): qty 1

## 2020-08-06 MED ORDER — SODIUM CHLORIDE 0.9 % IV BOLUS (SEPSIS)
500.0000 mL | Freq: Once | INTRAVENOUS | Status: AC
  Administered 2020-08-06: 500 mL via INTRAVENOUS

## 2020-08-06 MED ORDER — ALPRAZOLAM 0.25 MG PO TABS
0.2500 mg | ORAL_TABLET | Freq: Every morning | ORAL | Status: DC
  Administered 2020-08-07 – 2020-08-08 (×2): 0.25 mg via ORAL
  Filled 2020-08-06 (×2): qty 1

## 2020-08-06 MED ORDER — ACETAMINOPHEN 650 MG RE SUPP: 650.0000 mg | Freq: Four times a day (QID) | RECTAL | Status: DC | PRN

## 2020-08-06 MED ORDER — ONDANSETRON HCL 4 MG/2ML IJ SOLN
INTRAMUSCULAR | Status: AC
  Filled 2020-08-06: qty 2

## 2020-08-06 MED ORDER — METOCLOPRAMIDE HCL 5 MG/ML IJ SOLN: 5.0000 mg | Freq: Three times a day (TID) | INTRAMUSCULAR | Status: DC | PRN

## 2020-08-06 MED ORDER — CHLORHEXIDINE GLUCONATE 0.12 % MT SOLN
15.0000 mL | Freq: Once | OROMUCOSAL | Status: AC
  Administered 2020-08-06: 15 mL via OROMUCOSAL
  Filled 2020-08-06: qty 15

## 2020-08-06 MED ORDER — CHLORHEXIDINE GLUCONATE 4 % EX LIQD
60.0000 mL | Freq: Once | CUTANEOUS | Status: AC
  Administered 2020-08-06: 4 via TOPICAL
  Filled 2020-08-06: qty 60

## 2020-08-06 MED ORDER — ESMOLOL HCL 100 MG/10ML IV SOLN
INTRAVENOUS | Status: DC | PRN
  Administered 2020-08-06 (×3): 20 mg via INTRAVENOUS

## 2020-08-06 MED ORDER — ORAL CARE MOUTH RINSE: 15.0000 mL | Freq: Once | OROMUCOSAL | Status: AC

## 2020-08-06 MED ORDER — GLYCOPYRROLATE PF 0.2 MG/ML IJ SOSY
PREFILLED_SYRINGE | INTRAMUSCULAR | Status: AC
  Filled 2020-08-06: qty 1

## 2020-08-06 MED ORDER — INSULIN DETEMIR 100 UNIT/ML ~~LOC~~ SOLN
8.0000 [IU] | Freq: Every day | SUBCUTANEOUS | Status: DC
  Administered 2020-08-06 – 2020-08-08 (×3): 8 [IU] via SUBCUTANEOUS
  Filled 2020-08-06 (×4): qty 0.08

## 2020-08-06 MED ORDER — LORATADINE 10 MG PO TABS
10.0000 mg | ORAL_TABLET | Freq: Every day | ORAL | Status: DC
  Administered 2020-08-06 – 2020-08-08 (×3): 10 mg via ORAL
  Filled 2020-08-06 (×3): qty 1

## 2020-08-06 MED ORDER — METHOCARBAMOL 500 MG PO TABS
500.0000 mg | ORAL_TABLET | Freq: Four times a day (QID) | ORAL | Status: DC | PRN
  Administered 2020-08-07: 500 mg via ORAL
  Filled 2020-08-06: qty 1

## 2020-08-06 MED ORDER — SODIUM CHLORIDE 0.9 % IV SOLN: 2.0000 g | Freq: Two times a day (BID) | INTRAVENOUS | Status: DC

## 2020-08-06 MED ORDER — TRANEXAMIC ACID-NACL 1000-0.7 MG/100ML-% IV SOLN
1000.0000 mg | Freq: Once | INTRAVENOUS | Status: AC
  Administered 2020-08-06: 1000 mg via INTRAVENOUS
  Filled 2020-08-06: qty 100

## 2020-08-06 MED ORDER — ACETAMINOPHEN 325 MG PO TABS: 650.0000 mg | ORAL_TABLET | Freq: Four times a day (QID) | ORAL | Status: DC | PRN

## 2020-08-06 MED ORDER — ENOXAPARIN SODIUM 40 MG/0.4ML IJ SOSY
40.0000 mg | PREFILLED_SYRINGE | INTRAMUSCULAR | Status: DC
  Administered 2020-08-06: 40 mg via SUBCUTANEOUS
  Filled 2020-08-06: qty 0.4

## 2020-08-06 MED ORDER — PRAVASTATIN SODIUM 40 MG PO TABS
40.0000 mg | ORAL_TABLET | Freq: Every day | ORAL | Status: DC
  Administered 2020-08-06 – 2020-08-07 (×2): 40 mg via ORAL
  Filled 2020-08-06 (×2): qty 1

## 2020-08-06 MED ORDER — LIDOCAINE HCL (PF) 2 % IJ SOLN
INTRAMUSCULAR | Status: AC
  Filled 2020-08-06: qty 5

## 2020-08-06 MED ORDER — SUGAMMADEX SODIUM 200 MG/2ML IV SOLN
INTRAVENOUS | Status: DC | PRN
  Administered 2020-08-06: 200 mg via INTRAVENOUS

## 2020-08-06 MED ORDER — SUCCINYLCHOLINE CHLORIDE 200 MG/10ML IV SOSY
PREFILLED_SYRINGE | INTRAVENOUS | Status: AC
  Filled 2020-08-06: qty 10

## 2020-08-06 MED ORDER — VANCOMYCIN HCL 1500 MG/300ML IV SOLN
1500.0000 mg | Freq: Once | INTRAVENOUS | Status: DC
  Administered 2020-08-06: 1500 mg via INTRAVENOUS
  Filled 2020-08-06: qty 300

## 2020-08-06 MED ORDER — METRONIDAZOLE 500 MG/100ML IV SOLN
500.0000 mg | Freq: Three times a day (TID) | INTRAVENOUS | Status: DC
  Administered 2020-08-06: 500 mg via INTRAVENOUS
  Filled 2020-08-06: qty 100

## 2020-08-06 MED ORDER — PANTOPRAZOLE SODIUM 40 MG PO TBEC
40.0000 mg | DELAYED_RELEASE_TABLET | Freq: Every day | ORAL | Status: DC
  Administered 2020-08-06 – 2020-08-08 (×3): 40 mg via ORAL
  Filled 2020-08-06 (×3): qty 1

## 2020-08-06 MED ORDER — ACETAMINOPHEN 10 MG/ML IV SOLN
1000.0000 mg | Freq: Once | INTRAVENOUS | Status: AC
  Administered 2020-08-06: 1000 mg via INTRAVENOUS
  Filled 2020-08-06: qty 100

## 2020-08-06 MED ORDER — DIPHENHYDRAMINE HCL 50 MG/ML IJ SOLN
INTRAMUSCULAR | Status: AC
  Filled 2020-08-06: qty 1

## 2020-08-06 MED ORDER — BUPIVACAINE-EPINEPHRINE (PF) 0.5% -1:200000 IJ SOLN
INTRAMUSCULAR | Status: DC | PRN
  Administered 2020-08-06: 60 mL via PERINEURAL

## 2020-08-06 MED ORDER — ALPRAZOLAM 0.25 MG PO TABS
0.2500 mg | ORAL_TABLET | Freq: Two times a day (BID) | ORAL | Status: DC | PRN
  Administered 2020-08-06 – 2020-08-07 (×3): 0.25 mg via ORAL
  Filled 2020-08-06 (×3): qty 1

## 2020-08-06 MED ORDER — TRANEXAMIC ACID-NACL 1000-0.7 MG/100ML-% IV SOLN
1000.0000 mg | INTRAVENOUS | Status: AC
  Administered 2020-08-06: 1000 mg via INTRAVENOUS

## 2020-08-06 MED ORDER — METHOCARBAMOL 1000 MG/10ML IJ SOLN
500.0000 mg | Freq: Four times a day (QID) | INTRAVENOUS | Status: DC | PRN
  Filled 2020-08-06: qty 5

## 2020-08-06 MED ORDER — ENALAPRIL MALEATE 5 MG PO TABS
2.5000 mg | ORAL_TABLET | Freq: Every day | ORAL | Status: DC
  Administered 2020-08-06 – 2020-08-08 (×3): 2.5 mg via ORAL
  Filled 2020-08-06 (×5): qty 1

## 2020-08-06 SURGICAL SUPPLY — 56 items
BENZOIN TINCTURE PRP APPL 2/3 (GAUZE/BANDAGES/DRESSINGS) ×2 IMPLANT
BIT DRILL AO GAMMA 4.2X300 (BIT) ×2 IMPLANT
BLADE SURG SZ10 CARB STEEL (BLADE) ×4 IMPLANT
BNDG GAUZE ELAST 4 BULKY (GAUZE/BANDAGES/DRESSINGS) ×4 IMPLANT
CHLORAPREP W/TINT 26 (MISCELLANEOUS) ×2 IMPLANT
CLOTH BEACON ORANGE TIMEOUT ST (SAFETY) ×2 IMPLANT
CLSR STERI-STRIP ANTIMIC 1/2X4 (GAUZE/BANDAGES/DRESSINGS) ×2 IMPLANT
COVER LIGHT HANDLE STERIS (MISCELLANEOUS) ×4 IMPLANT
COVER MAYO STAND XLG (MISCELLANEOUS) ×2 IMPLANT
COVER PERINEAL POST (MISCELLANEOUS) ×2 IMPLANT
DECANTER SPIKE VIAL GLASS SM (MISCELLANEOUS) ×4 IMPLANT
DRAPE STERI IOBAN 125X83 (DRAPES) ×2 IMPLANT
DRSG MEPILEX BORDER 4X12 (GAUZE/BANDAGES/DRESSINGS) ×2 IMPLANT
DRSG MEPILEX SACRM 8.7X9.8 (GAUZE/BANDAGES/DRESSINGS) ×2 IMPLANT
DRSG PAD ABDOMINAL 8X10 ST (GAUZE/BANDAGES/DRESSINGS) ×2 IMPLANT
ELECT REM PT RETURN 9FT ADLT (ELECTROSURGICAL) ×2
ELECTRODE REM PT RTRN 9FT ADLT (ELECTROSURGICAL) ×1 IMPLANT
GLOVE SKINSENSE NS SZ8.0 LF (GLOVE) ×1
GLOVE SKINSENSE STRL SZ8.0 LF (GLOVE) ×1 IMPLANT
GLOVE SS N UNI LF 8.5 STRL (GLOVE) ×2 IMPLANT
GLOVE SURG POLYISO LF SZ7.5 (GLOVE) ×2 IMPLANT
GLOVE SURG UNDER POLY LF SZ7 (GLOVE) ×4 IMPLANT
GLOVE SURG UNDER POLY LF SZ7.5 (GLOVE) ×2 IMPLANT
GOWN STRL REUS W/TWL LRG LVL3 (GOWN DISPOSABLE) ×2 IMPLANT
GOWN STRL REUS W/TWL XL LVL3 (GOWN DISPOSABLE) ×2 IMPLANT
GUIDEROD T2 3X1000 (ROD) ×2 IMPLANT
INST SET MAJOR BONE (KITS) ×2 IMPLANT
K-WIRE  3.2X450M STR (WIRE) ×2
K-WIRE 3.2X450M STR (WIRE) ×2
KIT BLADEGUARD II DBL (SET/KITS/TRAYS/PACK) ×2 IMPLANT
KIT TURNOVER CYSTO (KITS) ×2 IMPLANT
KWIRE 3.2X450M STR (WIRE) ×2 IMPLANT
MANIFOLD NEPTUNE II (INSTRUMENTS) ×2 IMPLANT
MARKER SKIN DUAL TIP RULER LAB (MISCELLANEOUS) ×2 IMPLANT
NAIL TROCH GAMMA 11X18 (Nail) ×2 IMPLANT
NEEDLE HYPO 21X1.5 SAFETY (NEEDLE) ×2 IMPLANT
NEEDLE SPNL 18GX3.5 QUINCKE PK (NEEDLE) ×2 IMPLANT
NS IRRIG 1000ML POUR BTL (IV SOLUTION) ×2 IMPLANT
PACK BASIC III (CUSTOM PROCEDURE TRAY) ×1
PACK SRG BSC III STRL LF ECLPS (CUSTOM PROCEDURE TRAY) ×1 IMPLANT
PAD ARMBOARD 7.5X6 YLW CONV (MISCELLANEOUS) ×2 IMPLANT
PENCIL SMOKE EVACUATOR COATED (MISCELLANEOUS) ×2 IMPLANT
SCREW LAG GAMMA 3 95MM (Screw) ×2 IMPLANT
SCREW LOCKING T2 F/T  5X32.5MM (Screw) ×1 IMPLANT
SCREW LOCKING T2 F/T 5X32.5MM (Screw) ×1 IMPLANT
SET BASIN LINEN APH (SET/KITS/TRAYS/PACK) ×2 IMPLANT
SPONGE T-LAP 18X18 ~~LOC~~+RFID (SPONGE) ×4 IMPLANT
STAPLER VISISTAT 35W (STAPLE) ×2 IMPLANT
SUT BRALON NAB BRD #1 30IN (SUTURE) ×2 IMPLANT
SUT MNCRL 0 VIOLET CTX 36 (SUTURE) ×1 IMPLANT
SUT MON AB 2-0 CT1 36 (SUTURE) ×2 IMPLANT
SUT MONOCRYL 0 CTX 36 (SUTURE) ×1
SYR 30ML LL (SYRINGE) ×2 IMPLANT
SYR BULB IRRIG 60ML STRL (SYRINGE) ×4 IMPLANT
TRAY FOLEY MTR SLVR 16FR STAT (SET/KITS/TRAYS/PACK) ×2 IMPLANT
YANKAUER SUCT BULB TIP NO VENT (SUCTIONS) ×2 IMPLANT

## 2020-08-06 NOTE — H&P (Signed)
PATIENT NAME: Madeline Fox    MR#:  [id number]  DATE OF BIRTH:  09/19/1945  DATE OF ADMISSION:  08/05/2020  PRIMARY CARE PHYSICIAN: Zhou-Talbert, Elwyn Lade, MD   Patient is coming from: Home  REQUESTING/REFERRING PHYSICIAN: Ripley Fraise, MD  CHIEF COMPLAINT:   Chief Complaint  Patient presents with  . Hip Pain    HISTORY OF PRESENT ILLNESS:  Madeline Fox is a 75 y.o. Caucasian female with medical history significant for hypertension, dyslipidemia, hypothyroidism, stage III chronic kidney disease, type 2 diabetes mellitus, who presented to the emergency room with acute onset of right hip pain after having an accidental mechanical fall tripping over an air mattress.  She denied any head injury, presyncope or syncope.  No chest pain or palpitations.  No paresthesias or focal muscle weakness.  She denied any fever or chills.  No dysuria, oliguria or hematuria or flank pain.  She had mild headache in the ER.  After her fall she could not ambulate. ED Course: When she came to the ER blood pressure was 188/73 with otherwise normal vital signs.  Labs revealed unremarkable BMP.  CBC was within normal.  Influenza antigens and COVID-19 PCR came back negative.   EKG as reviewed by me : Showed normal sinus rhythm with a rate of 86 with RSR-and RVH. Imaging: Portable chest x-ray showed no acute cardiopulmonary disease.  Left hip and knee x-ray showed no acute abnormalities.  Left knee x-ray showed chronic tricompartmental degenerative changes. Right knee x-ray showed degenerative changes without acute abnormality.  Right hip x-ray showed nondisplaced right anterior trochanteric femoral fracture.  The patient was given 50 mcg of IV fentanyl and 0.25 mg of p.o. Xanax as well as 500 mill of IV normal saline.  She will be admitted to a medical-surgical bed for further evaluation and management.   PAST MEDICAL HISTORY:   Past Medical History:  Diagnosis Date  . Anemia     iron deficiency anemia  . Anxiety   . Arthritis   . Breast cancer ([place]) 2007   LT LUMPECTOMY  . Cancer ([place]) 2007   L BREAST CANCER - CHEMO/RADIATION /LUMPECTOMY    . Chronic kidney disease    stage 3  . Diabetes mellitus without complication (HCC)    Type 2  . Diverticulosis of colon with hemorrhage   . Goiter   . Heart murmur   . Hyperlipidemia   . Hypertension   . Hyperthyroidism   . Hypothyroidism    s/p thyroid ablation  . Internal hemorrhoids   . Neuromuscular disorder ([place])    NEUROPATHY IN TOES  . Nocturia   . Ovarian carcinoma in situ   . Personal history of chemotherapy   . Personal history of radiation therapy   . Pneumonia   . Psoriasis   . S/P chemotherapy, time since greater than 12 weeks 2007   BREAST CA  . S/P radiation > 12 weeks 2007   BREAST CA  . Thrombocytopenia ([place])   . Wears dentures    partial upper    PAST SURGICAL HISTORY:   Past Surgical History:  Procedure Laterality Date  . ABDOMINAL HYSTERECTOMY    . APPENDECTOMY    . BREAST BIOPSY Left 2007   POS  . BREAST LUMPECTOMY    . CATARACT EXTRACTION W/PHACO Right 09/29/2017   Procedure: CATARACT EXTRACTION PHACO AND INTRAOCULAR LENS PLACEMENT ([place])  RIGHT DIABETIC;  Surgeon: Leandrew Koyanagi, MD;  Location: [facility];  Service: Ophthalmology;  Laterality: Right;  Diabetic - oral meds  . CATARACT EXTRACTION W/PHACO Left 02/09/2018   Procedure: CATARACT EXTRACTION PHACO AND INTRAOCULAR LENS PLACEMENT ([place]) LEFT DIABETIC;  Surgeon: Leandrew Koyanagi, MD;  Location: [facility];  Service: Ophthalmology;  Laterality: Left;  . CHOLECYSTECTOMY    . COLONOSCOPY WITH PROPOFOL N/A 04/12/2015   Procedure: COLONOSCOPY WITH PROPOFOL;  Surgeon: Hulen Luster, MD;  Location[facility];  Service: Gastroenterology;  Laterality: N/A;  . FINE NEEDLE ASPIRATION Left    left upper lobe thyroid - benign  . MASTECTOMY PARTIAL / LUMPECTOMY Left 2007   chemo and radiation  .  OOPHORECTOMY     for carcinoma in situ  . THYROIDECTOMY  11/19/2011   Procedure: THYROIDECTOMY;  Surgeon: Earnstine Regal, MD;  Location: [facility];  Service: General;  Laterality: N/A;  Total Thyroidectomy  . THYROIDECTOMY      SOCIAL HISTORY:   Social History   Tobacco Use  . Smoking status: Never  . Smokeless tobacco: Never  Substance Use Topics  . Alcohol use: No    FAMILY HISTORY:   Family History  Problem Relation Age of Onset  . Cancer Mother   . Heart disease Father   . Cancer Sister   . Cancer Brother   . Breast cancer Cousin     DRUG ALLERGIES:   Allergies  Allergen Reactions  . Augmentin [Amoxicillin-Pot Clavulanate]     nervousness    REVIEW OF SYSTEMS:   ROS As per history of present illness. All pertinent systems were reviewed above. Constitutional, HEENT, cardiovascular, respiratory, GI, GU, musculoskeletal, neuro, psychiatric, endocrine, integumentary and hematologic systems were reviewed and are otherwise negative/unremarkable except for positive findings mentioned above in the HPI.   MEDICATIONS AT HOME:   Prior to Admission medications   Medication Sig Start Date End Date Taking? Authorizing Provider  ACCU-CHEK AVIVA PLUS test strip USE UP TO 2 TO 3 TIMES DAILY AS DIRECTED 07/16/20   Cassandria Anger, MD  Accu-Chek FastClix Lancets MISC Pt  Test BG tid 11/07/19   Cassandria Anger, MD  Alcohol Swabs (B-D SINGLE USE SWABS REGULAR) PADS USE TWICE DAILY 08/01/18   Nida, Marella Chimes, MD  ALPRAZolam (NIRAVAM) 0.25 MG dissolvable tablet Take 0.25 mg by mouth every morning.     [provider]  aspirin 81 MG tablet Take 81 mg by mouth every morning.     [provider]  blood glucose meter kit and supplies KIT Dispense based on patient and insurance preference. Use up to 2-3 times daily as directed. (FOR ICD-10 E11.65). 07/26/17   Cassandria Anger, MD  cetirizine (ZYRTEC) 10 MG tablet Take 10 mg by mouth at bedtime.     [provider]  Cholecalciferol (VITAMIN D3) 1000 units CAPS Take daily by mouth.    [provider]  enalapril (VASOTEC) 2.5 MG tablet 1 tablet 11/06/19   [provider]  ferrous sulfate 325 (65 FE) MG tablet Take 325 mg by mouth 2 (two) times daily.    [provider]  fluticasone (FLONASE) 50 MCG/ACT nasal spray Place 1 spray into both nostrils 2 (two) times daily. 12/02/11   [provider]  levothyroxine (SYNTHROID) 88 MCG tablet Take 1 tablet (88 mcg total) by mouth daily before breakfast. 01/10/19   Nida, Marella Chimes, MD  lovastatin (MEVACOR) 40 MG tablet Take 40 mg by mouth at bedtime.  08/17/11   [provider]  Omega-3 Fatty Acids (FISH OIL) 1000  MG CAPS Take daily by mouth.    [provider]  omeprazole (PRILOSEC) 40 MG capsule Take 40 mg daily by mouth.    [provider]  pioglitazone (ACTOS) 15 MG tablet TAKE 1 TABLET (15 MG TOTAL) BY MOUTH DAILY. 06/03/20   Brita Romp, NP  sitaGLIPtin (JANUVIA) 50 MG tablet Take 50 mg by mouth daily.    [provider]  vitamin B-12 (CYANOCOBALAMIN) 1000 MCG tablet Take 1,000 mcg by mouth daily.    [provider]      VITAL SIGNS:  Blood pressure (!) 158/60, pulse 79, temperature 98.1 F (36.7 C), temperature source Oral, resp. rate 17, height _0  (1.6 m), weight 77.1 kg, SpO2 97 %.  PHYSICAL EXAMINATION:  Physical Exam  GENERAL:  75 y.o.-year-old Caucasian female patient lying in the bed with no acute distress.  EYES: Pupils equal, round, reactive to light and accommodation. No scleral icterus. Extraocular muscles intact.  HEENT: Head atraumatic, normocephalic. Oropharynx and nasopharynx clear.  NECK:  Supple, no jugular venous distention. No thyroid enlargement, no tenderness.  LUNGS: Normal breath sounds bilaterally, no wheezing, rales,rhonchi or crepitation. No use of accessory muscles of respiration.  CARDIOVASCULAR: Regular rate and  rhythm, S1, S2 normal. No murmurs, rubs, or gallops.  ABDOMEN: Soft, nondistended, nontender. Bowel sounds present. No organomegaly or mass.  EXTREMITIES: No pedal edema, cyanosis, or clubbing. Musculoskeletal: Right hip tenderness. NEUROLOGIC: Cranial nerves II through XII are intact. Muscle strength 5/5 in all extremities. Sensation intact. Gait not checked.  PSYCHIATRIC: The patient is alert and oriented x 3.  Normal affect and good eye contact. SKIN: No obvious rash, lesion, or ulcer.   LABORATORY PANEL:   CBC Recent Labs  Lab 08/06/20 0004  WBC 6.9  HGB 10.4*  HCT 31.3*  PLT 175   ------------------------------------------------------------------------------------------------------------------  Chemistries  Recent Labs  Lab 08/06/20 0004  NA 124*  K 4.2  CL 91*  CO2 26  GLUCOSE 178*  BUN 21  CREATININE 1.11*  CALCIUM 8.4*   ------------------------------------------------------------------------------------------------------------------  Cardiac Enzymes No results for input(s): TROPONINI in the last 168 hours. ------------------------------------------------------------------------------------------------------------------  RADIOLOGY:  DG Chest Port 1 View  Result Date: 08/06/2020 CLINICAL DATA:  Recent fall with right hip fracture, initial encounter EXAM: PORTABLE CHEST 1 VIEW COMPARISON:  03/02/2020 FINDINGS: Cardiac shadows within normal limits. Aortic calcifications are noted. The lungs are clear. Postsurgical changes in the neck are noted. No acute bony abnormality is seen. IMPRESSION: No active disease. Electronically Signed   By: Inez Catalina M.D.   On: 08/06/2020 00:20   DG Knee Left Port  Result Date: 08/06/2020 CLINICAL DATA:  Chronic left knee pain EXAM: PORTABLE LEFT KNEE - 2 VIEW COMPARISON:  None. FINDINGS: Tricompartmental degenerative changes are noted without acute fracture or dislocation. Mild lateral displacement of the tibia with respect to the  distal femur is seen. No joint effusion is noted. IMPRESSION: Chronic tricompartmental degenerative changes. Electronically Signed   By: Inez Catalina M.D.   On: 08/06/2020 01:18   DG Knee Right Port  Result Date: 08/06/2020 CLINICAL DATA:  Known right hip fracture, right knee pain, initial encounter EXAM: PORTABLE RIGHT KNEE - 2 VIEW COMPARISON:  None. FINDINGS: Tricompartmental degenerative changes are noted similar to that seen on the left. No acute fracture or dislocation is noted. IMPRESSION: Degenerative change without acute abnormality. Electronically Signed   By: Inez Catalina M.D.   On: 08/06/2020 01:18   DG HIP UNILAT WITH PELVIS 2-3 VIEWS LEFT  Result  Date: 08/06/2020 CLINICAL DATA:  Chronic hip pain EXAM: DG HIP (WITH OR WITHOUT PELVIS) 2V LEFT COMPARISON:  None. FINDINGS: Visualized pelvic ring appears intact. Mild degenerative changes of the left hip joint are seen. No acute fracture or dislocation is noted. No soft tissue abnormality is seen. IMPRESSION: No acute abnormality noted. Previously seen lucency is not duplicated on these dedicated images. Electronically Signed   By: Inez Catalina M.D.   On: 08/06/2020 01:17   DG Hip Unilat W or Wo Pelvis 2-3 Views Right  Result Date: 08/06/2020 CLINICAL DATA:  Recent trip and fall with hip pain, initial encounter EXAM: DG HIP (WITH OR WITHOUT PELVIS) 2-3V RIGHT COMPARISON:  None. FINDINGS: Undisplaced intratrochanteric fracture of the proximal right femur is noted. No dislocation is seen. Pelvic ring is intact. Degenerative changes of lumbar spine are noted as well. Some lucency is noted over the proximal left femur as well felt to be projectional in nature although follow-up films would be helpful if there is any left-sided pain. IMPRESSION: Undisplaced right intratrochanteric femoral fracture. Lucency is noted over the proximal left hip although incompletely evaluated on this exam. If any left-sided pain is present left hip films would be  helpful. Electronically Signed   By: Inez Catalina M.D.   On: 08/06/2020 00:00      IMPRESSION AND PLAN:  Active Problems:   Closed right hip fracture (HCC)  1.  Closed right hip fracture secondary to mechanical fall. - The patient will be admitted to a medical/surgical bed. - Pain management to be provided. - Orthopedic consultation will be obtained. - Dr. Aline Brochure was notified and is aware about the patient. - The patient will be kept n.p.o. - Per the revised cardiac risk index she is considered at average risk for her age for perioperative cardiovascular events.  She has no history of renal failure with a creatinine more than 2, or diabetes mellitus on insulin, CVA, or CHF or coronary artery disease.  She has no current pulmonary issues.  2.  Hyponatremia. - We will obtain hyponatremia work-up and hydrate the patient with IV normal saline. - We will follow her BMP.  3.  Type 2 diabetes mellitus. - The patient will be placed on supplement coverage with NovoLog and continue Actos and Januvia.  4.  Anxiety. - We will continue Xanax.  5.  Hypothyroidism. - We will continue Synthroid.  6.  Dyslipidemia. - We will continue statin therapy and fish oil.  7.  Essential hypertension. - We will continue Vasotec.  DVT prophylaxis: SCDs.  Medical prophylaxis is postponed till postoperative period. Code Status: The patient is DNR/DNI. Family Communication:  The plan of care was discussed in details with the patient (and her daughter who was with her in the room). I answered all questions. The patient agreed to proceed with the above mentioned plan. Further management will depend upon hospital course. Disposition Plan: Back to previous home environment Consults called: Orthopedic consult. All the records are reviewed and case discussed with ED provider.  Status is: Inpatient  Remains inpatient appropriate because:Ongoing active pain requiring inpatient pain management, Ongoing  diagnostic testing needed not appropriate for outpatient work up, Unsafe d/c plan, IV treatments appropriate due to intensity of illness or inability to take PO, and Inpatient level of care appropriate due to severity of illness  Dispo: The patient is from: Home              Anticipated d/c is to: SNF  Patient currently is not medically stable to d/c.   Difficult to place patient No  TOTAL TIME TAKING CARE OF THIS PATIENT: 55 minutes.    Christel Mormon M.D on 08/06/2020 at 1:56 AM  [facility]   From 7 PM-7 AM, contact night-coverage [email or web address]  CC: Primary care physician; Zhou-Talbert, Elwyn Lade, MD

## 2020-08-06 NOTE — Progress Notes (Signed)
Patient seen and examined. Admitted after midnight after mechanical fall with results of right hip fracture. No signs of acute infection seen or suspected currently. Patient is afebrile, normal WBC's and normal X-ray; she also denies dysuria, hematuria, coughing spells or any other complaints. Patient has been admitted for surgical repair and might required SNF for care and rehab at discharge; PT evaluation to follow after surgery. Please refer to H&P by Dr. Sidney Ace for further info/details on admission.   Plan: -follow post operative rec's by orthopedic service -continue PRN analgesics and supportive care -patient with what appears to be acute on chronic hyponatremia, currently asymptomatic from that; work up in process. Continue IVF's and follow electrolyte trend. -holding oral hypoglycemic agents while inpatient; follow CBG's and use SSI and levemir.  Barton Dubois MD [phone]

## 2020-08-06 NOTE — Plan of Care (Signed)
  Problem: Education: Goal: Knowledge of General Education information will improve Description Including pain rating scale, medication(s)/side effects and non-pharmacologic comfort measures Outcome: Progressing   

## 2020-08-06 NOTE — Anesthesia Postprocedure Evaluation (Signed)
Anesthesia Post Note  Patient: Madeline Fox  Procedure(s) Performed: INTRAMEDULLARY (IM) NAIL INTERTROCHANTRIC (Right: Hip)  Patient location during evaluation: PACU Anesthesia Type: General Level of consciousness: awake and alert Pain management: pain level controlled Vital Signs Assessment: post-procedure vital signs reviewed and stable Respiratory status: spontaneous breathing, nonlabored ventilation, respiratory function stable and patient connected to nasal cannula oxygen Cardiovascular status: blood pressure returned to baseline and stable Postop Assessment: no apparent nausea or vomiting Anesthetic complications: no   No notable events documented.   Last Vitals:  Vitals:   08/06/20 1159 08/06/20 1521  BP: (!) 182/62 (!) 184/77  Pulse: 84 98  Resp: 16 17  Temp: 36.7 C   SpO2: 100% 95%    Last Pain:  Vitals:   08/06/20 1159  TempSrc: Oral  PainSc: [name]

## 2020-08-06 NOTE — Op Note (Signed)
Open treatment internal fixation of the hip with CEPHALOMEDULLARY nail  Implant Stryker gamma nail short 125 degree nail, short 11 x 180 mm 95 mm lag screw 32.5 distal locking screw Set screw set and sliding position   08/06/2020 3:20 PM   Preop diagnosis right hip fracture intertrochanteric  Postoperative diagnosis same Procedure open treatment internal fixation of the right hip Surgeon [place] none Anesthesia General Surgical findings 2 part [place] ADMINISTERED:none  DRAINS: none   LOCAL MEDICATIONS USED: Marcaine with epi 60 cc   SPECIMEN:  No Specimen  DISPOSITION OF SPECIMEN:  N/A  COUNTS:  CORRECT   DICTATION: .Dragon Dictation   Surgeon Aline Brochure  The patient was taken to the recovery room in stable condition  PLAN OF CARE: Discharge to home after PACU  PATIENT DISPOSITION:  PACU - hemodynamically stable.   Delay start of Pharmacological VTE agent (>24hrs) due to surgical blood loss or risk of bleeding: Not needed    The surgery was done as follows: The patient was identified in the preop area the surgical site (right hip) was confirmed and marked after thorough chart review including radiographs; implants were checked personally.  The patient was brought back to the operating room for anesthesia and then was placed on the fracture table. The operative leg was placed in traction the nonoperative leg was padded and placed in a boot and abducted  The C-arm was brought in and a closed reduction was performed with the C-arm.  Multiplane x-rays were taken and the leg was manipulated until a stable reduction was obtained using traction to obtain proper neck shaft angle and then rotation to correct rotational malalignment  The right leg was prepped and draped with ChloraPrep. This was followed by timeout which confirmed as surgical site, implants, x-ray gowns and badges.  The incision was made over the right hip at the  greater trochanter proximally, subcutaneous tissue was divided down to the fascial layer which was split in line with the skin and incision.  This was followed by blunt dissection with a finger down to the tip of the trochanter  The sharp tipped awl was passed down to the level lesser TROCHANTER  followed by insertion of a guidewire down to the knee.  X-ray confirmed position of the guidewire and proximal reaming was performed down to the level of the lesser trochanter.  We then passed a Stryker gamma 125 degree nail  A second incision was made distal to the initial incision and the subcutaneous tissue was divided, muscle and fascia were split down to bone   After correction for rotation (a perforating drill was used to breech the cortex of the lateral femur followed by) insertion of a threaded guidewire which was placed in the center of the femoral head on AP and lateral x-ray  We measured the pin distance at 95 mm and then set the reamer to the same number  (95 mm)  followed by reaming over the guidewire.  The lag screw was then placed over the guidewire and the guidewire was removed.  (After irrigation of the proximal portion of the nail the setscrew was locked and then backed off one quarter turn.  This was followed by confirming engagement of the setscrew by toggling the screwdriver connected to the lag screw.)  We next use the external guide to place a third incision and then passed a cannula with a sharp tip followed by drilling with a 4.2 drill bit and a 32.5  locking screw was placed  Final images confirmed reduction of the fracture and hardware position.  The wound was irrigated with copious amounts of saline and closed in layered fashion starting with 0 Monocryl, #1 Bralon, 0 Monocryl and 2-0 Monocryl Steri-Strips and benzoin  We used a total of 60 ml of Marcaine with epinephrine dividing it between each layer  A sterile bandage was applied  Postoperative plan Weightbearing as  tolerated Anticoagulation for 28 days Follow-up visit at 4 weeks for x-rays and then x-rays at and 12 weeks [id number]  SURGEON:  Surgeon(s) and Role:    Carole Civil, MD - Primary

## 2020-08-06 NOTE — Consult Note (Signed)
Madeline Fox  08/06/2020  Body mass index is 30.11 kg/m.  ASSESSMENT AND PLAN:     Intertrochanteric fracture right hip, 2 part  Plan open treatment internal fixation with intramedullary nail.  The patient has bilateral knee arthritis that will eventually require total knees so I will proceed with a short nail to avoid any interference with intramedullary instrumentation if she goes on to have the total knee    The procedure has been fully reviewed with the patient; The risks and benefits of surgery have been discussed and explained and understood. Alternative treatment has also been reviewed, questions were encouraged and answered. The postoperative plan is also been reviewed.   Chief Complaint  Patient presents with   Hip Pain   HPI   The patient presents for evaluation of severe pain right hip for 1 day with date of injury August 05, 2020 this is associated with inability to walk and deformity of the right lower extremity.  The patient has some medical problems listed below uses a walker on occasion because of her knee pain and knee arthritis on both sides.  She simply tripped and had a mechanical fall  I have interpreted the following images: Imaging Images show pelvis right hip with nondisplaced intertrochanteric fracture and also a left hip was taken for questionable fracture which was normal  She also had knee films which show severe arthritis of both knees   HISTORY SECTION :   ROS  All medical systems were reviewed the patient denies shortness of breath or chest pain has some intermittent but frequent knee pain and difficulty walking all other systems were negative   has a past medical history of Anemia, Anxiety, Arthritis, Breast cancer ([place]) (2007), Cancer ([place]) (2007), Chronic kidney disease, Diabetes mellitus without complication ([place]), Diverticulosis of colon with hemorrhage, Goiter, Heart murmur, Hyperlipidemia, Hypertension, Hyperthyroidism, Hypothyroidism,  Internal hemorrhoids, Neuromuscular disorder ([place]), Nocturia, Ovarian carcinoma in situ, Personal history of chemotherapy, Personal history of radiation therapy, Pneumonia, Psoriasis, S/P chemotherapy, time since greater than 12 weeks (2007), S/P radiation > 12 weeks (2007), Thrombocytopenia ([place]), and Wears dentures.   Past Surgical History:  Procedure Laterality Date   ABDOMINAL HYSTERECTOMY     APPENDECTOMY     BREAST BIOPSY Left 2007   POS   BREAST LUMPECTOMY     CATARACT EXTRACTION W/PHACO Right 09/29/2017   Procedure: CATARACT EXTRACTION PHACO AND INTRAOCULAR LENS PLACEMENT ([place])  RIGHT DIABETIC;  Surgeon: Leandrew Koyanagi, MD;  Location: [facility];  Service: Ophthalmology;  Laterality: Right;  Diabetic - oral meds   CATARACT EXTRACTION W/PHACO Left 02/09/2018   Procedure: CATARACT EXTRACTION PHACO AND INTRAOCULAR LENS PLACEMENT ([place]) LEFT DIABETIC;  Surgeon: Leandrew Koyanagi, MD;  Location: [facility];  Service: Ophthalmology;  Laterality: Left;   CHOLECYSTECTOMY     COLONOSCOPY WITH PROPOFOL N/A 04/12/2015   Procedure: COLONOSCOPY WITH PROPOFOL;  Surgeon: Hulen Luster, MD;  Location: [facility];  Service: Gastroenterology;  Laterality: N/A;   FINE NEEDLE ASPIRATION Left    left upper lobe thyroid - benign   MASTECTOMY PARTIAL / LUMPECTOMY Left 2007   chemo and radiation   OOPHORECTOMY     for carcinoma in situ   THYROIDECTOMY  11/19/2011   Procedure: THYROIDECTOMY;  Surgeon: Earnstine Regal, MD;  Location: [facility];  Service: General;  Laterality: N/A;  Total Thyroidectomy   THYROIDECTOMY      Social History   Socioeconomic History   Marital status: Married    Spouse name: Not on  file   Number of children: Not on file   Years of education: Not on file   Highest education level: Not on file  Occupational History   Not on file  Tobacco Use   Smoking status: Never   Smokeless tobacco: Never  Vaping Use   Vaping Use: Never used  Substance and Sexual  Activity   Alcohol use: No   Drug use: No   Sexual activity: Not on file  Other Topics Concern   Not on file  Social History Narrative   Not on file   Social Determinants of Health   Financial Resource Strain: Not on file  Food Insecurity: Not on file  Transportation Needs: Not on file  Physical Activity: Not on file  Stress: Not on file  Social Connections: Not on file  Intimate Partner Violence: Not on file     Family History  Problem Relation Age of Onset   Cancer Mother    Heart disease Father    Cancer Sister    Cancer Brother    Breast cancer Cousin       Allergies  Allergen Reactions   Augmentin [Amoxicillin-Pot Clavulanate]     nervousness     Current Facility-Administered Medications:    0.9 %  sodium chloride infusion, , Intravenous, Continuous, Mansy, Jan A, MD, Stopped at 08/06/20 0516   0.9 %  sodium chloride infusion, , Intravenous, Continuous, Mansy, Jan A, MD, Last Rate: 100 mL/hr at 08/06/20 0515, New Bag at 08/06/20 0515   acetaminophen (TYLENOL) tablet 650 mg, 650 mg, Oral, Q6H PRN **OR** acetaminophen (TYLENOL) suppository 650 mg, 650 mg, Rectal, Q6H PRN, Mansy, Jan A, MD   acetaminophen (TYLENOL) tablet 650 mg, 650 mg, Oral, Q6H PRN **OR** acetaminophen (TYLENOL) suppository 650 mg, 650 mg, Rectal, Q6H PRN, Mansy, Jan A, MD   ALPRAZolam (NIRAVAM) dissolvable tablet 0.25 mg, 0.25 mg, Oral, q morning, Mansy, Jan A, MD   ALPRAZolam Duanne Moron) tablet 0.25 mg, 0.25 mg, Oral, BID PRN, Mansy, Jan A, MD, 0.25 mg at 08/06/20 0256   enalapril (VASOTEC) tablet 2.5 mg, 2.5 mg, Oral, Daily, Mansy, Jan A, MD   enoxaparin (LOVENOX) injection 40 mg, 40 mg, Subcutaneous, Q24H, Mansy, Jan A, MD, 40 mg at 08/06/20 2725   fentaNYL (SUBLIMAZE) injection 50 mcg, 50 mcg, Intravenous, Q1H PRN, Ripley Fraise, MD, 50 mcg at 08/06/20 3664   ferrous sulfate tablet 325 mg, 325 mg, Oral, BID, Mansy, Jan A, MD   fluticasone (FLONASE) 50 MCG/ACT nasal spray 1 spray, 1 spray,  Each Nare, BID, Mansy, Jan A, MD   insulin aspart (novoLOG) injection 0-9 Units, 0-9 Units, Subcutaneous, TID AC & HS, Mansy, Jan A, MD   levothyroxine (SYNTHROID) tablet 88 mcg, 88 mcg, Oral, QAC breakfast, Mansy, Jan A, MD   linagliptin (TRADJENTA) tablet 5 mg, 5 mg, Oral, Daily, Mansy, Jan A, MD   loratadine (CLARITIN) tablet 10 mg, 10 mg, Oral, Daily, Mansy, Jan A, MD   magnesium hydroxide (MILK OF MAGNESIA) suspension 30 mL, 30 mL, Oral, Daily PRN, Mansy, Jan A, MD   magnesium hydroxide (MILK OF MAGNESIA) suspension 30 mL, 30 mL, Oral, Daily PRN, Mansy, Jan A, MD   metroNIDAZOLE (FLAGYL) IVPB 500 mg, 500 mg, Intravenous, Q8H, Mansy, Jan A, MD, Stopped at 08/06/20 [phone]   omega-3 acid ethyl esters (LOVAZA) capsule 1 g, 1 g, Oral, Daily, Mansy, Jan A, MD   ondansetron (ZOFRAN) tablet 4 mg, 4 mg, Oral, Q6H PRN **OR** ondansetron (ZOFRAN) injection 4 mg, 4 mg,  Intravenous, Q6H PRN, Mansy, Jan A, MD   ondansetron (ZOFRAN) tablet 4 mg, 4 mg, Oral, Q6H PRN **OR** ondansetron (ZOFRAN) injection 4 mg, 4 mg, Intravenous, Q6H PRN, Mansy, Jan A, MD   pantoprazole (PROTONIX) EC tablet 40 mg, 40 mg, Oral, Daily, Mansy, Jan A, MD   pioglitazone (ACTOS) tablet 15 mg, 15 mg, Oral, Daily, Mansy, Jan A, MD   pravastatin (PRAVACHOL) tablet 40 mg, 40 mg, Oral, q1800, Mansy, Jan A, MD   traZODone (DESYREL) tablet 25 mg, 25 mg, Oral, QHS PRN, Mansy, Jan A, MD   vancomycin (VANCOREADY) IVPB 1500 mg/300 mL, 1,500 mg, Intravenous, Once, Mansy, Jan A, MD, Last Rate: 150 mL/hr at 08/06/20 5643, 1,500 mg at 08/06/20 3295   vitamin B-12 (CYANOCOBALAMIN) tablet 1,000 mcg, 1,000 mcg, Oral, Daily, Mansy, Arvella Merles, MD   Vitamin D3 CAPS 1,000 Units, 1,000 Units, Oral, Daily, Mansy, Jan A, MD  Current Outpatient Medications:    ACCU-CHEK AVIVA PLUS test strip, USE UP TO 2 TO 3 TIMES DAILY AS DIRECTED, Disp: 300 strip, Rfl: 2   Accu-Chek FastClix Lancets MISC, Pt  Test BG tid, Disp: 306 each, Rfl: 1   Alcohol Swabs (B-D SINGLE  USE SWABS REGULAR) PADS, USE TWICE DAILY, Disp: 200 each, Rfl: 2   ALPRAZolam (NIRAVAM) 0.25 MG dissolvable tablet, Take 0.25 mg by mouth every morning. , Disp: , Rfl:    aspirin 81 MG tablet, Take 81 mg by mouth every morning. , Disp: , Rfl:    blood glucose meter kit and supplies KIT, Dispense based on patient and insurance preference. Use up to 2-3 times daily as directed. (FOR ICD-10 E11.65)., Disp: 1 each, Rfl: 5   cetirizine (ZYRTEC) 10 MG tablet, Take 10 mg by mouth at bedtime., Disp: , Rfl:    Cholecalciferol (VITAMIN D3) 1000 units CAPS, Take daily by mouth., Disp: , Rfl:    enalapril (VASOTEC) 2.5 MG tablet, 1 tablet, Disp: , Rfl:    ferrous sulfate 325 (65 FE) MG tablet, Take 325 mg by mouth 2 (two) times daily., Disp: , Rfl:    fluticasone (FLONASE) 50 MCG/ACT nasal spray, Place 1 spray into both nostrils 2 (two) times daily., Disp: , Rfl:    levothyroxine (SYNTHROID) 88 MCG tablet, Take 1 tablet (88 mcg total) by mouth daily before breakfast., Disp: 90 tablet, Rfl: 1   lovastatin (MEVACOR) 40 MG tablet, Take 40 mg by mouth at bedtime. , Disp: , Rfl:    Omega-3 Fatty Acids (FISH OIL) 1000 MG CAPS, Take daily by mouth., Disp: , Rfl:    omeprazole (PRILOSEC) 40 MG capsule, Take 40 mg daily by mouth., Disp: , Rfl:    pioglitazone (ACTOS) 15 MG tablet, TAKE 1 TABLET (15 MG TOTAL) BY MOUTH DAILY., Disp: 90 tablet, Rfl: 1   sitaGLIPtin (JANUVIA) 50 MG tablet, Take 50 mg by mouth daily., Disp: , Rfl:    vitamin B-12 (CYANOCOBALAMIN) 1000 MCG tablet, Take 1,000 mcg by mouth daily., Disp: , Rfl:    PHYSICAL EXAM SECTION: BP 126/82   Pulse 78   Temp 98.1 F (36.7 C) (Oral)   Resp 19   Ht 5' 3"  (1.6 m)   Wt 77.1 kg   SpO2 97%   BMI 30.11 kg/m   Body mass index is 30.11 kg/m.   General appearance: Well-developed well-nourished no gross deformities  Eyes clear normal vision no evidence of conjunctivitis or jaundice, extraocular muscles intact  ENT: ears hearing normal, nasal  passages clear, throat clear   Neck  is supple without palpable mass, full range of motion   Cardiovascular normal pulse and perfusion in all 4 extremities normal color without edema  Lymph nodes: No lymphadenopathy  Neurologically deep tendon reflexes are equal and normal, no sensation loss or deficits no pathologic reflexes   Skin no lacerations or ulcerations no nodularity no palpable masses, no erythema or nodularity  Psychological: Awake alert and oriented x3 mood and affect normal  Musculoskeletal:   Her upper extremities the skin is normal there is no tenderness he has full range of motion and normal muscle strength and muscle tone all joints are stable and reduced  The left lower extremity the skin is normal.  There is no tenderness.  Full range of motion is noted except for her knees which show crepitance and decreased range of motion joint stability is confirmed muscle tone is normal no instability is noted  Right lower extremity skin is normal tenderness proximal femur decreased range of motion and painful range of motion hip.  Instability tests were deferred because of pain and fracture muscle tone was normal   7:14 AM

## 2020-08-06 NOTE — Anesthesia Preprocedure Evaluation (Signed)
Anesthesia Evaluation  Patient identified by MRN, date of birth, ID band Patient awake    Reviewed: Allergy & Precautions, H&P , NPO status , Patient's Chart, lab work & pertinent test results, reviewed documented beta blocker date and time   Airway Mallampati: II  TM Distance: >3 FB Neck ROM: full    Dental no notable dental hx.    Pulmonary neg pulmonary ROS,    Pulmonary exam normal breath sounds clear to auscultation       Cardiovascular Exercise Tolerance: Good hypertension, negative cardio ROS   Rhythm:regular Rate:Normal     Neuro/Psych PSYCHIATRIC DISORDERS Anxiety  Neuromuscular disease    GI/Hepatic negative GI ROS, Neg liver ROS,   Endo/Other  diabetes, Type 2Hypothyroidism   Renal/GU CRFRenal disease  negative genitourinary   Musculoskeletal   Abdominal   Peds  Hematology  (+) Blood dyscrasia, anemia ,   Anesthesia Other Findings Acute on chronic hyponatremia, usually <=130.  Already improving.  Reproductive/Obstetrics negative OB ROS                             Anesthesia Physical Anesthesia Plan  ASA: 3  Anesthesia Plan: General   Post-op Pain Management:    Induction:   PONV Risk Score and Plan:   Airway Management Planned:   Additional Equipment:   Intra-op Plan:   Post-operative Plan:   Informed Consent: I have reviewed the patients History and Physical, chart, labs and discussed the procedure including the risks, benefits and alternatives for the proposed anesthesia with the patient or authorized representative who has indicated his/her understanding and acceptance.     Dental Advisory Given  Plan Discussed with: CRNA  Anesthesia Plan Comments:         Anesthesia Quick Evaluation

## 2020-08-06 NOTE — Anesthesia Procedure Notes (Signed)
Procedure Name: Intubation Date/Time: 08/06/2020 1:16 PM Performed by: Tacy Learn, CRNA Pre-anesthesia Checklist: Patient identified, Emergency Drugs available, Suction available, Patient being monitored and Timeout performed Patient Re-evaluated:Patient Re-evaluated prior to induction Oxygen Delivery Method: Circle system utilized Preoxygenation: Pre-oxygenation with 100% oxygen Induction Type: IV induction Laryngoscope Size: Miller and 2 Grade View: Grade I Tube type: Oral Tube size: 6.5 mm Number of attempts: 1 Airway Equipment and Method: Stylet Placement Confirmation: ETT inserted through vocal cords under direct vision, positive ETCO2, CO2 detector and breath sounds checked- equal and bilateral Secured at: 20 cm Tube secured with: Tape Dental Injury: Teeth and Oropharynx as per pre-operative assessment

## 2020-08-06 NOTE — Transfer of Care (Signed)
Immediate Anesthesia Transfer of Care Note  Patient: Madeline Fox  Procedure(s) Performed: INTRAMEDULLARY (IM) NAIL INTERTROCHANTRIC (Right: Hip)  Patient Location: PACU  Anesthesia Type:General  Level of Consciousness: awake, alert  and patient cooperative  Airway & Oxygen Therapy: Patient Spontanous Breathing  Post-op Assessment: Report given to RN and Post -op Vital signs reviewed and stable  Post vital signs: Reviewed and stable  Last Vitals:  Vitals Value Taken Time  BP 184/77 08/06/20 1521  Temp 98.1   Pulse 94 08/06/20 1522  Resp 17 08/06/20 1522  SpO2 96 % 08/06/20 1522  Vitals shown include unvalidated device data.  Last Pain:  Vitals:   08/06/20 1159  TempSrc: Oral  PainSc: 5       Patients Stated Pain Goal: 5 (07/21/92 8546)  Complications: No notable events documented.

## 2020-08-06 NOTE — ED Provider Notes (Signed)
[facility] EMERGENCY DEPARTMENT Provider Note   CSN: [id number] Arrival date & time: 08/05/20  2314     History Chief Complaint  Patient presents with   Hip Pain    Madeline Fox is a 75 y.o. female.  The history is provided by the patient.  Hip Pain This is a new problem. The current episode started 1 to 2 hours ago. The problem occurs constantly. The problem has not changed since onset.Pertinent negatives include no chest pain, no abdominal pain and no headaches. Exacerbated by: movement. Nothing relieves the symptoms. She has tried nothing for the symptoms.  Patient history of hypertension, hyperlipidemia presents after fall and right hip injury.  Patient reports she tripped over a mattress and landed on her right hip.  Denies head injury or LOC. No neck or back pain.  She reports pain throughout her legs but most of it is in her right hip     Past Medical History:  Diagnosis Date   Anemia    iron deficiency anemia   Anxiety    Arthritis    Breast cancer ([place]) 2007   LT LUMPECTOMY   Cancer ([place]) 2007   L BREAST CANCER - CHEMO/RADIATION /LUMPECTOMY     Chronic kidney disease    stage 3   Diabetes mellitus without complication ([place])    Type 2   Diverticulosis of colon with hemorrhage    Goiter    Heart murmur    Hyperlipidemia    Hypertension    Hyperthyroidism    Hypothyroidism    s/p thyroid ablation   Internal hemorrhoids    Neuromuscular disorder ([place])    NEUROPATHY IN TOES   Nocturia    Ovarian carcinoma in situ    Personal history of chemotherapy    Personal history of radiation therapy    Pneumonia    Psoriasis    S/P chemotherapy, time since greater than 12 weeks 2007   BREAST CA   S/P radiation > 12 weeks 2007   BREAST CA   Thrombocytopenia ([place])    Wears dentures    partial upper    Patient Active Problem List   Diagnosis Date Noted   Closed right hip fracture ([place]) 08/06/2020   Type 2 diabetes mellitus with stage 3a chronic kidney  disease, without long-term current use of insulin ([place]) 12/08/2016   Essential hypertension, benign 12/08/2016   Mixed hyperlipidemia 12/08/2016   Postsurgical hypothyroidism 12/09/2011   Goiter diffuse, nontoxic 11/02/2011    Past Surgical History:  Procedure Laterality Date   ABDOMINAL HYSTERECTOMY     APPENDECTOMY     BREAST BIOPSY Left 2007   POS   BREAST LUMPECTOMY     CATARACT EXTRACTION W/PHACO Right 09/29/2017   Procedure: CATARACT EXTRACTION PHACO AND INTRAOCULAR LENS PLACEMENT ([place])  RIGHT DIABETIC;  Surgeon: Leandrew Koyanagi, MD;  Location: [place];  Service: Ophthalmology;  Laterality: Right;  Diabetic - oral meds   CATARACT EXTRACTION W/PHACO Left 02/09/2018   Procedure: CATARACT EXTRACTION PHACO AND INTRAOCULAR LENS PLACEMENT ([place]) LEFT DIABETIC;  Surgeon: Leandrew Koyanagi, MD;  Location: [place];  Service: Ophthalmology;  Laterality: Left;   CHOLECYSTECTOMY     COLONOSCOPY WITH PROPOFOL N/A 04/12/2015   Procedure: COLONOSCOPY WITH PROPOFOL;  Surgeon: Hulen Luster, MD;  Location: [facility] ENDOSCOPY;  Service: Gastroenterology;  Laterality: N/A;   FINE NEEDLE ASPIRATION Left    left upper lobe thyroid - benign   MASTECTOMY PARTIAL / LUMPECTOMY Left 2007   chemo and radiation  OOPHORECTOMY     for carcinoma in situ   THYROIDECTOMY  11/19/2011   Procedure: THYROIDECTOMY;  Surgeon: Earnstine Regal, MD;  Location: [facility];  Service: General;  Laterality: N/A;  Total Thyroidectomy   THYROIDECTOMY       OB History   No obstetric history on file.     Family History  Problem Relation Age of Onset   Cancer Mother    Heart disease Father    Cancer Sister    Cancer Brother    Breast cancer Cousin     Social History   Tobacco Use   Smoking status: Never   Smokeless tobacco: Never  Vaping Use   Vaping Use: Never used  Substance Use Topics   Alcohol use: No   Drug use: No    Home Medications Prior to Admission medications   Medication Sig  Start Date End Date Taking? Authorizing Provider  ACCU-CHEK AVIVA PLUS test strip USE UP TO 2 TO 3 TIMES DAILY AS DIRECTED 07/16/20   Cassandria Anger, MD  Accu-Chek FastClix Lancets MISC Pt  Test BG tid 11/07/19   Cassandria Anger, MD  Alcohol Swabs (B-D SINGLE USE SWABS REGULAR) PADS USE TWICE DAILY 08/01/18   Nida, Marella Chimes, MD  ALPRAZolam (NIRAVAM) 0.25 MG dissolvable tablet Take 0.25 mg by mouth every morning.     [provider]  aspirin 81 MG tablet Take 81 mg by mouth every morning.     [provider]  blood glucose meter kit and supplies KIT Dispense based on patient and insurance preference. Use up to 2-3 times daily as directed. (FOR ICD-10 E11.65). 07/26/17   Cassandria Anger, MD  cetirizine (ZYRTEC) 10 MG tablet Take 10 mg by mouth at bedtime.    [provider]  Cholecalciferol (VITAMIN D3) 1000 units CAPS Take daily by mouth.    [provider]  enalapril (VASOTEC) 2.5 MG tablet 1 tablet 11/06/19   [provider]  ferrous sulfate 325 (65 FE) MG tablet Take 325 mg by mouth 2 (two) times daily.    [provider]  fluticasone (FLONASE) 50 MCG/ACT nasal spray Place 1 spray into both nostrils 2 (two) times daily. 12/02/11   [provider]  levothyroxine (SYNTHROID) 88 MCG tablet Take 1 tablet (88 mcg total) by mouth daily before breakfast. 01/10/19   Nida, Marella Chimes, MD  lovastatin (MEVACOR) 40 MG tablet Take 40 mg by mouth at bedtime.  08/17/11   [provider]  Omega-3 Fatty Acids (FISH OIL) 1000 MG CAPS Take daily by mouth.    [provider]  omeprazole (PRILOSEC) 40 MG capsule Take 40 mg daily by mouth.    [provider]  pioglitazone (ACTOS) 15 MG tablet TAKE 1 TABLET (15 MG TOTAL) BY MOUTH DAILY. 06/03/20   Brita Romp, NP  sitaGLIPtin (JANUVIA) 50 MG tablet Take 50 mg by mouth daily.    [provider]  vitamin B-12 (CYANOCOBALAMIN) 1000 MCG tablet  Take 1,000 mcg by mouth daily.    [provider]    Allergies    Augmentin [amoxicillin-pot clavulanate]  Review of Systems   Review of Systems  Constitutional:  Negative for fever.  Cardiovascular:  Negative for chest pain.  Gastrointestinal:  Negative for abdominal pain.  Musculoskeletal:  Positive for arthralgias. Negative for back pain and neck pain.  Neurological:  Negative for headaches.  All other systems reviewed and are negative.  Physical Exam Updated Vital Signs BP Marland Kitchen)  158/60   Pulse 79   Temp 98.1 F (36.7 C) (Oral)   Resp 17   Ht 1.6 m (5' 3" )   Wt 77.1 kg   SpO2 97%   BMI 30.11 kg/m   Physical Exam CONSTITUTIONAL: Elderly, no acute distress HEAD: Normocephalic/atraumatic EYES: EOMI/PERRL ENMT: Mucous membranes moist NECK: supple no meningeal signs SPINE/BACK:entire spine nontender, no cervical spine tenderness, no bruising/crepitance/stepoffs noted to spine CV: S1/S2 noted, no murmurs/rubs/gallops noted LUNGS: Lungs are clear to auscultation bilaterally, no apparent distress ABDOMEN: soft, nontender NEURO: Pt is awake/alert/appropriate, moves all extremitiesx4.  No facial droop.   EXTREMITIES: pulses normal/equal in both feet, tenderness with range of motion of right hip.  Tenderness noted with palpation of bilateral knees.  No deformities noted All other extremities/joints palpated/ranged and nontender SKIN: warm, color normal PSYCH: no abnormalities of mood noted, alert and oriented to situation  ED Results / Procedures / Treatments   Labs (all labs ordered are listed, but only abnormal results are displayed) Labs Reviewed  BASIC METABOLIC PANEL - Abnormal; Notable for the following components:      Result Value   Sodium 124 (*)    Chloride 91 (*)    Glucose, Bld 178 (*)    Creatinine, Ser 1.11 (*)    Calcium 8.4 (*)    GFR, Estimated 51 (*)    All other components within normal limits  CBC WITH DIFFERENTIAL/PLATELET - Abnormal;  Notable for the following components:   RBC 3.38 (*)    Hemoglobin 10.4 (*)    HCT 31.3 (*)    All other components within normal limits  RESP PANEL BY RT-PCR (FLU A&B, COVID) [id number]  PROTIME-INR  TYPE AND SCREEN    EKG EKG Interpretation  Date/Time:  Monday August 05 2020 23:23:13 EDT Ventricular Rate:  86 PR Interval:  201 QRS Duration: 94 QT Interval:  363 QTC Calculation: 435 R Axis:   27 Text Interpretation: Sinus rhythm RSR' in V1 or V2, right VCD or RVH Interpretation limited secondary to artifact Confirmed by Ripley Fraise ([id number]) on 08/05/2020 11:55:05 PM  Radiology DG Chest Port 1 View  Result Date: 08/06/2020 CLINICAL DATA:  Recent fall with right hip fracture, initial encounter EXAM: PORTABLE CHEST 1 VIEW COMPARISON:  03/02/2020 FINDINGS: Cardiac shadows within normal limits. Aortic calcifications are noted. The lungs are clear. Postsurgical changes in the neck are noted. No acute bony abnormality is seen. IMPRESSION: No active disease. Electronically Signed   By: Inez Catalina M.D.   On: 08/06/2020 00:20   DG Knee Left Port  Result Date: 08/06/2020 CLINICAL DATA:  Chronic left knee pain EXAM: PORTABLE LEFT KNEE - 2 VIEW COMPARISON:  None. FINDINGS: Tricompartmental degenerative changes are noted without acute fracture or dislocation. Mild lateral displacement of the tibia with respect to the distal femur is seen. No joint effusion is noted. IMPRESSION: Chronic tricompartmental degenerative changes. Electronically Signed   By: Inez Catalina M.D.   On: 08/06/2020 01:18   DG Knee Right Port  Result Date: 08/06/2020 CLINICAL DATA:  Known right hip fracture, right knee pain, initial encounter EXAM: PORTABLE RIGHT KNEE - 2 VIEW COMPARISON:  None. FINDINGS: Tricompartmental degenerative changes are noted similar to that seen on the left. No acute fracture or dislocation is noted. IMPRESSION: Degenerative change without acute abnormality. Electronically Signed   By: Inez Catalina  M.D.   On: 08/06/2020 01:18   DG HIP UNILAT WITH PELVIS 2-3 VIEWS LEFT  Result Date: 08/06/2020 CLINICAL DATA:  Chronic hip  pain EXAM: DG HIP (WITH OR WITHOUT PELVIS) 2V LEFT COMPARISON:  None. FINDINGS: Visualized pelvic ring appears intact. Mild degenerative changes of the left hip joint are seen. No acute fracture or dislocation is noted. No soft tissue abnormality is seen. IMPRESSION: No acute abnormality noted. Previously seen lucency is not duplicated on these dedicated images. Electronically Signed   By: Inez Catalina M.D.   On: 08/06/2020 01:17   DG Hip Unilat W or Wo Pelvis 2-3 Views Right  Result Date: 08/06/2020 CLINICAL DATA:  Recent trip and fall with hip pain, initial encounter EXAM: DG HIP (WITH OR WITHOUT PELVIS) 2-3V RIGHT COMPARISON:  None. FINDINGS: Undisplaced intratrochanteric fracture of the proximal right femur is noted. No dislocation is seen. Pelvic ring is intact. Degenerative changes of lumbar spine are noted as well. Some lucency is noted over the proximal left femur as well felt to be projectional in nature although follow-up films would be helpful if there is any left-sided pain. IMPRESSION: Undisplaced right intratrochanteric femoral fracture. Lucency is noted over the proximal left hip although incompletely evaluated on this exam. If any left-sided pain is present left hip films would be helpful. Electronically Signed   By: Inez Catalina M.D.   On: 08/06/2020 00:00    Procedures Procedures   Medications Ordered in ED Medications  fentaNYL (SUBLIMAZE) injection 50 mcg (50 mcg Intravenous Given 08/06/20 0103)  sodium chloride 0.9 % bolus 500 mL (has no administration in time range)    ED Course  I have reviewed the triage vital signs and the nursing notes.  Pertinent labs & imaging results that were available during my care of the patient were reviewed by me and considered in my medical decision making (see chart for details).    MDM Rules/Calculators/A&P                           Patient presents after mechanical fall injuring her right hip. Patient reports she tripped on a mattress landed on her right side.  I have personally reviewed the x-ray and it reveals a right hip fracture.  Patient is neurovascularly intact.  Labs reveal acute on chronic hyponatremia. Patient has no signs of any head or neck trauma. I did obtain imaging of her left lower extremity and knees, but there is no other acute finding  Discussed the case with Dr. Aline Brochure with orthopedics.  Plan to admit to the hospitalist and likely operative management in the next 24 hours.  Patient and family were updated on plan Discussed with Dr. Sidney Ace  for admission Final Clinical Impression(s) / ED Diagnoses Final diagnoses:  Closed fracture of right hip, initial encounter [facility])  Hyponatremia    Rx / DC Orders ED Discharge Orders     None        Ripley Fraise, MD 08/06/20 [phone]

## 2020-08-06 NOTE — Brief Op Note (Signed)
08/06/2020  3:17 PM  PATIENT:  Madeline Fox  75 y.o. female  PRE-OPERATIVE DIAGNOSIS:  RIGHT HIP FRACTURE  POST-OPERATIVE DIAGNOSIS:  RIGHT HIP FRACTURE  PROCEDURE:  Procedure(s): INTRAMEDULLARY (IM) NAIL INTERTROCHANTRIC (Right)  Gamma nail 125 degrees short nail 180 x 11 mm 32.5 distal locking screw 95 mm lag screw  Findings 2 part nondisplaced fracture slight external rotation intertrochanteric fracture right hip  SURGEON:  Surgeon(s) and Role:    Carole Civil, MD - Primary  PHYSICIAN ASSISTANT:   ASSISTANTS: none   ANESTHESIA:   general  EBL:  100 mL   BLOOD ADMINISTERED:none  DRAINS: none   LOCAL MEDICATIONS USED:  MARCAINE     SPECIMEN:  No Specimen  DISPOSITION OF SPECIMEN:  N/A  COUNTS:  YES  TOURNIQUET:  * No tourniquets in log *  DICTATION: .Dragon Dictation  PLAN OF CARE: Admit to inpatient   PATIENT DISPOSITION:  PACU - hemodynamically stable.   Delay start of Pharmacological VTE agent (>24hrs) due to surgical blood loss or risk of bleeding: not applicable

## 2020-08-07 ENCOUNTER — Encounter (HOSPITAL_COMMUNITY): Payer: Self-pay | Admitting: Orthopedic Surgery

## 2020-08-07 LAB — BASIC METABOLIC PANEL
Anion gap: 7 (ref 5–15)
BUN: 13 mg/dL (ref 8–23)
CO2: 23 mmol/L (ref 22–32)
Calcium: 8 mg/dL — ABNORMAL LOW (ref 8.9–10.3)
Chloride: 91 mmol/L — ABNORMAL LOW (ref 98–111)
Creatinine, Ser: 1.08 mg/dL — ABNORMAL HIGH (ref 0.44–1.00)
GFR, Estimated: 53 mL/min — ABNORMAL LOW (ref >60)
Glucose, Bld: 207 mg/dL — ABNORMAL HIGH (ref 70–99)
Potassium: 3.9 mmol/L (ref 3.5–5.1)
Sodium: 121 mmol/L — ABNORMAL LOW (ref 135–145)

## 2020-08-07 LAB — CBC
HCT: 26.4 % — ABNORMAL LOW (ref 36.0–46.0)
Hemoglobin: 8.6 g/dL — ABNORMAL LOW (ref 12.0–15.0)
MCH: 30.3 pg (ref 26.0–34.0)
MCHC: 32.6 g/dL (ref 30.0–36.0)
MCV: 93 fL (ref 80.0–100.0)
Platelets: 163 10*3/uL (ref 150–400)
RBC: 2.84 MIL/uL — ABNORMAL LOW (ref 3.87–5.11)
RDW: 13.8 % (ref 11.5–15.5)
WBC: 8.5 10*3/uL (ref 4.0–10.5)
nRBC: 0 % (ref 0.0–0.2)

## 2020-08-07 LAB — PROTIME-INR
INR: 1.2 (ref 0.8–1.2)
Prothrombin Time: 14.9 seconds (ref 11.4–15.2)

## 2020-08-07 LAB — GLUCOSE, CAPILLARY: Glucose-Capillary: 173 mg/dL — ABNORMAL HIGH (ref 70–99)

## 2020-08-07 LAB — CORTISOL-AM, BLOOD: Cortisol - AM: 13 ug/dL (ref 6.7–22.6)

## 2020-08-07 LAB — PROCALCITONIN: Procalcitonin: <0.1 ng/mL

## 2020-08-07 MED ORDER — TROLAMINE SALICYLATE 10 % EX CREA
TOPICAL_CREAM | Freq: Two times a day (BID) | CUTANEOUS | Status: DC | PRN
  Filled 2020-08-07: qty 85

## 2020-08-07 MED ORDER — SODIUM CHLORIDE 1 G PO TABS
1.0000 g | ORAL_TABLET | Freq: Every day | ORAL | Status: DC
  Administered 2020-08-07 – 2020-08-08 (×2): 1 g via ORAL
  Filled 2020-08-07 (×2): qty 1

## 2020-08-07 MED ORDER — OXYCODONE HCL 5 MG PO TABS
5.0000 mg | ORAL_TABLET | Freq: Four times a day (QID) | ORAL | Status: DC | PRN
  Administered 2020-08-07: 5 mg via ORAL
  Administered 2020-08-08: 10 mg via ORAL
  Filled 2020-08-07: qty 1
  Filled 2020-08-07: qty 2

## 2020-08-07 MED ORDER — HYDROCODONE-ACETAMINOPHEN 5-325 MG PO TABS
1.0000 | ORAL_TABLET | ORAL | Status: DC | PRN
  Administered 2020-08-07: 1 via ORAL
  Filled 2020-08-07: qty 1

## 2020-08-07 MED ORDER — ACETAMINOPHEN 325 MG PO TABS: 650.0000 mg | ORAL_TABLET | Freq: Four times a day (QID) | ORAL | Status: DC | PRN

## 2020-08-07 MED ORDER — MUSCLE RUB 10-15 % EX CREA
TOPICAL_CREAM | CUTANEOUS | Status: DC | PRN
  Administered 2020-08-07: 1 via TOPICAL
  Filled 2020-08-07: qty 85

## 2020-08-07 NOTE — Progress Notes (Signed)
Pt. Was due to void at noon. Nurse encourage pt. To try and void. Pt. Was not able to. Nurse bladder scan pt. Scan showed 138mL urine. Provider notified. State to give pt. Fluids and reassess in 3 more hours.

## 2020-08-07 NOTE — TOC Initial Note (Signed)
Transition of Care [facility]) - Initial/Assessment Note    Patient Details  Name: Madeline Fox MRN: [id number] Date of Birth: 1945/06/12  [facility] CM/SW Contact:    Shade Flood, LCSW Phone Number: 08/07/2020, 3:41 PM  Clinical Narrative:                  Pt admitted from home with a hip fracture. PT recommending SNF rehab. Per MD, pt family stating that they prefer to take pt home with [facility]. Spoke with pt's daughter today to discuss dc planning. Pt's daughter reports that pt and pt's husband live together in their home and that MANY family members live right nearby and they have a lot of help available to assist with pt's care. Daughter confirms that they do not want SNF placement. Discussed HH/DME providers and CMS agency options. Will refer as requested.   Anticipating dc home tomorrow. Will follow up in AM.  Expected Discharge Plan: [place] Barriers to Discharge: Continued Medical Work up   Patient Goals and CMS Choice Patient states their goals for this hospitalization and ongoing recovery are:: go home CMS Medicare.gov Compare Post Acute Care list provided to:: Patient Represenative (must comment) Choice offered to / list presented to : Adult Children  Expected Discharge Plan and Services Expected Discharge Plan: [place] In-house Referral: Clinical Social Work   Post Acute Care Choice: Durable Medical Equipment, Home Health Living arrangements for the past 2 months: [place]                 DME Arranged: 3-N-1 DME Agency: [employer] Date DME Agency Contacted: 08/07/20   Representative spoke with at DME Agency: Freda Munro HH Arranged: PT, RN [place] Agency: [place] Date [place]: 08/07/20   Representative spoke with at [place]: Tommi Rumps  Prior Living Arrangements/Services Living arrangements for the past 2 months: [place] Lives with:: Spouse Patient language and need for  interpreter reviewed:: Yes Do you feel safe going back to the place where you live?: Yes      Need for Family Participation in Patient Care: Yes (Comment) Care giver support system in place?: Yes (comment) Current home services: DME Criminal Activity/Legal Involvement Pertinent to Current Situation/Hospitalization: No - Comment as needed  Activities of Daily Living Home Assistive Devices/Equipment: Cane (specify quad or straight), Eyeglasses, Walker (specify type), Dentures (specify type) (upper dentures only) ADL Screening (condition at time of admission) Patient's cognitive ability adequate to safely complete daily activities?: Yes Is the patient deaf or have difficulty hearing?: No Does the patient have difficulty seeing, even when wearing glasses/contacts?: No Does the patient have difficulty concentrating, remembering, or making decisions?: No Patient able to express need for assistance with ADLs?: Yes Does the patient have difficulty dressing or bathing?: Yes (Result of injury) Independently performs ADLs?: Yes (appropriate for developmental age) Does the patient have difficulty walking or climbing stairs?: Yes Weakness of Legs: Both Weakness of Arms/Hands: None  Permission Sought/Granted Permission sought to share information with : Chartered certified accountant granted to share information with : Yes, Verbal Permission Granted     Permission granted to share info w AGENCY: HH, DME        Emotional Assessment Appearance:: Appears stated age     Orientation: : Oriented to Self, Oriented to Place, Oriented to  Time, Oriented to Situation Alcohol / Substance Use: Not Applicable Psych Involvement: No (comment)  Admission diagnosis:  Hyponatremia [E87.1]  Fall [W19.XXXA] Closed right hip fracture ([place]) [S72.001A] Closed fracture of right hip, initial encounter [facility]) [S72.001A] Patient Active Problem List   Diagnosis Date Noted   Closed right hip fracture ([place])  08/06/2020   Type 2 diabetes mellitus with stage 3a chronic kidney disease, without long-term current use of insulin ([place]) 12/08/2016   Essential hypertension, benign 12/08/2016   Mixed hyperlipidemia 12/08/2016   Postsurgical hypothyroidism 12/09/2011   Goiter diffuse, nontoxic 11/02/2011   PCP:  Alfonse Flavors, MD Pharmacy:   [place], [place] - [address] [place] [address] Phone: [phone] Fax: [phone]  [facility] - [place], [place] [address] Phone: [phone] Fax: [phone]     Social Determinants of Health (SDOH) Interventions    Readmission Risk Interventions Readmission Risk Prevention Plan 08/07/2020  Transportation Screening Complete  Home Care Screening Complete  Medication Review (RN CM) Complete  Some recent data might be hidden

## 2020-08-07 NOTE — Plan of Care (Addendum)
  Problem: Acute Rehab PT Goals(only PT should resolve) Goal: Pt Will Go Supine/Side To Sit Outcome: Progressing Flowsheets (Taken 08/07/2020 1113) Pt will go Supine/Side to Sit:  with minimal assist  with cues (comment type and amount) Goal: Patient Will Transfer Sit To/From Stand Outcome: Progressing Flowsheets (Taken 08/07/2020 1113) Patient will transfer sit to/from stand: with minimal assist Goal: Pt Will Transfer Bed To Chair/Chair To Bed Outcome: Progressing Flowsheets (Taken 08/07/2020 1113) Pt will Transfer Bed to Chair/Chair to Bed: with min assist Note: Using RW Goal: Pt Will Ambulate Outcome: Progressing   11:15 AM, 08/07/20 Sinclair Ship SPT  11:41 AM, 08/07/20 Lonell Grandchild, MPT Physical Therapist with [facility] [phone] office [phone] mobile phone

## 2020-08-07 NOTE — Evaluation (Addendum)
Physical Therapy Evaluation Patient Details Name: Madeline Fox MRN: [id number] DOB: 1945/06/05 Today's Date: 08/07/2020   History of Present Illness  Madeline Fox is a 75 y.o. Caucasian female with medical history significant for hypertension, dyslipidemia, hypothyroidism, stage III chronic kidney disease, type 2 diabetes mellitus, who presented to the emergency room with acute onset of right hip pain after having an accidental mechanical fall tripping over an air mattress.  She denied any head injury, presyncope or syncope.  No chest pain or palpitations.  No paresthesias or focal muscle weakness.  She denied any fever or chills.  No dysuria, oliguria or hematuria or flank pain.  She had mild headache in the ER.  After her fall she could not ambulate.   Clinical Impression  Patient presents in bed with family member present and agreeable to therapy. Patient required mod assist with slow labored movements and frequent breaks for bed mobility due to reports of increase pain in her R hip and B knees. Patient was supervision for seated balance at EOB. Patient required mod/max assist for sit to stand and transfer due to reports of increase in pain. Patient was limited to a few steps at the bed side during ambulation due to increased reports of pain and required verbal cueing for use of RW. Patent was left in chair with family members present - nursing notified. Patient will benefit from continued physical therapy in hospital and recommended venue below to increase strength, balance, endurance for safe ADLs and gait.   Follow Up Recommendations SNF    Equipment Recommendations  None recommended by PT    Recommendations for Other Services       Precautions / Restrictions Precautions Precautions: Fall Restrictions Weight Bearing Restrictions: Yes RLE Weight Bearing: Weight bearing as tolerated      Mobility  Bed Mobility Overal bed mobility: Needs Assistance Bed Mobility: Supine to Sit      Supine to sit: Mod assist     General bed mobility comments: limited due to pain in R hip, manual assist for RLE movement Patient Response: Cooperative  Transfers Overall transfer level: Needs assistance Equipment used: Rolling walker (2 wheeled) Transfers: Sit to/from [facility] Sit to Stand: Mod assist;Max assist Stand pivot transfers: Mod assist;Max assist       General transfer comment: slow labred movement, difficulty bearing weight on RLE due to pain, required verbal and tactile cues for steps to chair  Ambulation/Gait Ambulation/Gait assistance: Mod assist;Max assist Gait Distance (Feet): 4 Feet Assistive device: Rolling walker (2 wheeled) Gait Pattern/deviations: Decreased step length - right;Decreased step length - left;Decreased stance time - right;Decreased stride length;Decreased weight shift to right;Narrow base of support;Trunk flexed Gait velocity: decreased   General Gait Details: slow labored movement, increased time, limited to a few small steps at the bed side during transfer  Stairs            Wheelchair Mobility    Modified Rankin (Stroke Patients Only)       Balance Overall balance assessment: Needs assistance Sitting-balance support: Feet supported;Single extremity supported Sitting balance-Leahy Scale: Poor Sitting balance - Comments: fair/poor at EOB   Standing balance support: Bilateral upper extremity supported;During functional activity Standing balance-Leahy Scale: Poor Standing balance comment: using RW                             Pertinent Vitals/Pain Pain Assessment: Faces Faces Pain Scale: Hurts whole lot Pain Location: R hip, bilatral knees  Pain Descriptors / Indicators: Sore;Guarding;Grimacing Pain Intervention(s): Limited activity within patient's tolerance;Monitored during session;Premedicated before session;Repositioned    Home Living Family/patient expects to be discharged to:: Private  residence Living Arrangements: Spouse/significant other;Children Available Help at Discharge: Available 24 hours/day;Family Type of Home: Mobile home Home Access: Ramped entrance Entrance Stairs-Rails: None   Home Layout: One level Home Equipment: Environmental consultant - 2 wheels;Cane - single point;Shower seat;Grab bars - tub/shower      Prior Function Level of Independence: Independent with assistive device(s)         Comments: used cane for ambulation     Hand Dominance        Extremity/Trunk Assessment   Upper Extremity Assessment Upper Extremity Assessment: Overall WFL for tasks assessed    Lower Extremity Assessment Lower Extremity Assessment: Generalized weakness    Cervical / Trunk Assessment Cervical / Trunk Assessment: Normal  Communication   Communication: No difficulties  Cognition Arousal/Alertness: Awake/alert Behavior During Therapy: WFL for tasks assessed/performed Overall Cognitive Status: Within Functional Limits for tasks assessed                                        General Comments      Exercises     Assessment/Plan    PT Assessment Patient needs continued PT services  PT Problem List Decreased strength;Decreased range of motion;Decreased activity tolerance;Decreased balance;Decreased mobility;Decreased coordination       PT Treatment Interventions DME instruction;Gait training;Stair training;Functional mobility training;Therapeutic activities;Therapeutic exercise;Balance training;Patient/family education    PT Goals (Current goals can be found in the Care Plan section)  Acute Rehab PT Goals Patient Stated Goal: return home PT Goal Formulation: With patient/family Time For Goal Achievement: 08/09/20 Potential to Achieve Goals: Good    Frequency Min 4X/week   Barriers to discharge        Co-evaluation               AM-PAC PT "6 Clicks" Mobility  Outcome Measure Help needed turning from your back to your side  while in a flat bed without using bedrails?: A Lot Help needed moving from lying on your back to sitting on the side of a flat bed without using bedrails?: A Lot Help needed moving to and from a bed to a chair (including a wheelchair)?: A Lot Help needed standing up from a chair using your arms (e.g., wheelchair or bedside chair)?: A Lot Help needed to walk in hospital room?: A Lot Help needed climbing 3-5 steps with a railing? : Total 6 Click Score: 11    End of Session   Activity Tolerance: Patient tolerated treatment well;Patient limited by pain Patient left: in chair;with family/visitor present;with call bell/phone within reach Nurse Communication: Mobility status PT Visit Diagnosis: Unsteadiness on feet (R26.81);Difficulty in walking, not elsewhere classified (R26.2);Muscle weakness (generalized) (M62.81)    Time: 1779-3903 PT Time Calculation (min) (ACUTE ONLY): 14 min   Charges:   PT Evaluation $PT Eval Low Complexity: 1 Low PT Treatments $Therapeutic Activity: 8-22 mins       11:44 AM, 08/07/20 Sinclair Ship SPT  11:44 AM, 08/07/20 Lonell Grandchild, MPT Physical Therapist with [facility] [phone] office [phone] mobile phone

## 2020-08-07 NOTE — Progress Notes (Signed)
Subjective: 1 Day Post-Op Procedure(s) (LRB): INTRAMEDULLARY (IM) NAIL INTERTROCHANTRIC (Right) Patient reports pain as mild.    Objective: Vital signs BP (!) 144/60 (BP Location: Left Arm)   Pulse 87   Temp 98.2 F (36.8 C) (Oral)   Resp 15   Ht 5\' 3"  (1.6 m)   Wt 77.1 kg   SpO2 98%   BMI 30.11 kg/m   Intake/Output from previous day: 07/12 0701 - 07/13 0700 In: 1880 [P.O.:680; I.V.:1000; IV Piggyback:200] Out: 700 [Urine:600; Blood:100] Intake/Output this shift: Total I/O In: 680 [P.O.:680] Out: -   Recent Labs    08/06/20 0004 08/06/20 0458 08/07/20 0414  HGB 10.4* 10.3* 8.6*   Recent Labs    08/06/20 0458 08/07/20 0414  WBC 10.1 8.5  RBC 3.39* 2.84*  HCT 31.2* 26.4*  PLT 177 163   Recent Labs    08/06/20 0458 08/07/20 0414  NA 126* 121*  K 4.0 3.9  CL 94* 91*  CO2 24 23  BUN 19 13  CREATININE 0.93 1.08*  GLUCOSE 208* 207*  CALCIUM 8.8* 8.0*   Recent Labs    08/06/20 0004 08/07/20 0414  INR 1.0 1.2    Neurologically intact Neurovascular intact Sensation intact distally Intact pulses distally Dorsiflexion/Plantar flexion intact Incision: scant drainage Compartment soft   Assessment/Plan: 1 Day Post-Op Procedure(s) (LRB): INTRAMEDULLARY (IM) NAIL INTERTROCHANTRIC (Right) Advance diet Up with therapy D/C IV fluids Discharge home with home health   Postoperative plan Weightbearing as tolerated Anticoagulation for 28 days Follow-up visit at 4 weeks for x-rays and then x-rays at and 12 weeks [place] 08/07/2020, 3:44 PM

## 2020-08-07 NOTE — Progress Notes (Signed)
PROGRESS NOTE    Madeline Fox  YQM:[id number] DOB: 1945/05/03 DOA: 08/05/2020 PCP: Alfonse Flavors, MD   Chief Complaint  Patient presents with   Hip Pain    Brief admission narrative: As Per H&P written by Dr. Sidney Ace on 08/06/2020 Madeline Fox is a 75 y.o. Caucasian female with medical history significant for hypertension, dyslipidemia, hypothyroidism, stage III chronic kidney disease, type 2 diabetes mellitus, who presented to the emergency room with acute onset of right hip pain after having an accidental mechanical fall tripping over an air mattress.  She denied any head injury, presyncope or syncope.  No chest pain or palpitations.  No paresthesias or focal muscle weakness.  She denied any fever or chills.  No dysuria, oliguria or hematuria or flank pain.  Assessment & Plan: 1-closed right hip fracture ([place]) -Secondary to mechanical fall at home -S/p ORIF 08/06/2020 -Still complaining of significant pain requiring IV pain medication -Weightbearing as tolerated -Full dose aspirin for DVT prophylaxis (planning for 28 days treatment) -outpatient follow up with ortho in 4 weeks for x-rays -Patient was seen by physical therapy with recommendation for a skilled nursing facility; patient and family has declined nursing home and instead requesting home health services. -Will attempt oral analgesics and follow response.  2-hyponatremia -continue maintaining adequate hydration -daily sodium tabs to be started -follow electrolytes trend   3-type 2 diabetes -Continue sliding scale insulin and follow CBGs fluctuation -Continue holding oral hypoglycemic agents while inpatient.  4-bilateral knees pain -Will use Aspercreme topical -Patient receiving pain medication for her hip fracture  5-hypothyroidism -Continue Synthroid.  6-makes hyperlipidemia -Continue statins and fish oil.  7-essential hypertension -Continue Vasotec -Blood pressure stable  8-chronic kidney disease  stage IIIb -Appears to be stable and at baseline -Continue minimizing nephrotoxic agents and avoid hypotension and contrast use -Continue to follow renal function trend -Maintain adequate hydration.   DVT prophylaxis: Full dose aspirin Code Status: DNR Family Communication: Daughter at bedside. Disposition:   Status is: Inpatient  Remains inpatient appropriate because:Ongoing active pain requiring inpatient pain management  Dispo: The patient is from: Home              Anticipated d/c is to: Home              Patient currently is not medically stable to d/c.   Difficult to place patient No       Consultants:  Orthopedic service  Procedures: S/p right hip ORIF on 08/06/20  Antimicrobials:  None  Subjective: Afebrile, no chest pain, no nausea, no vomiting.  Good saturation on room air.  Complaining of knees pain and also right hip discomfort.  Objective: Vitals:   08/06/20 1635 08/06/20 2023 08/07/20 0550 08/07/20 1410  BP: (!) 164/62 (!) 150/61 (!) 144/58 (!) 144/60  Pulse: 87 89 89 87  Resp: 17 19 15    Temp: 98 F (36.7 C) 98.2 F (36.8 C) 99 F (37.2 C) 98.2 F (36.8 C)  TempSrc: Oral  Oral Oral  SpO2: 95% 100% 99% 98%  Weight:      Height:        Intake/Output Summary (Last 24 hours) at 08/07/2020 1622 Last data filed at 08/07/2020 1300 Gross per 24 hour  Intake 1360 ml  Output 150 ml  Net 1210 ml   Filed Weights   08/05/20 2315 08/06/20 1159  Weight: 77.1 kg 77.1 kg    Examination:  General exam: Complaining of significant pain on her right hip while trying to move and also  having bilateral knees discomfort.  No chest pain, no nausea, no vomiting, no using accessory muscles or requiring oxygen supplementation.  Patient is afebrile. Respiratory system: Clear to auscultation. Respiratory effort normal.  Good saturation on room air. Cardiovascular system: Rate controlled, no rubs, no gallops, no JVD. Gastrointestinal system: Abdomen is nondistended,  soft and nontender. No organomegaly or masses felt. Normal bowel sounds heard. Central nervous system: Alert and oriented. No focal neurological deficits. Extremities: No cyanosis or clubbing; decreased range of motion appreciated on her right lower extremity secondary to recent hip fracture and ongoing pain. Skin: No petechiae.   Psychiatry: Judgement and insight appear normal. Mood & affect appropriate.     Data Reviewed: I have personally reviewed following labs and imaging studies  CBC: Recent Labs  Lab 08/06/20 0004 08/06/20 0458 08/07/20 0414  WBC 6.9 10.1 8.5  NEUTROABS 5.5  --   --   HGB 10.4* 10.3* 8.6*  HCT 31.3* 31.2* 26.4*  MCV 92.6 92.0 93.0  PLT 175 177 161    Basic Metabolic Panel: Recent Labs  Lab 08/06/20 0004 08/06/20 0458 08/07/20 0414  NA 124* 126* 121*  K 4.2 4.0 3.9  CL 91* 94* 91*  CO2 26 24 23   GLUCOSE 178* 208* 207*  BUN 21 19 13   CREATININE 1.11* 0.93 1.08*  CALCIUM 8.4* 8.8* 8.0*    GFR: Estimated Creatinine Clearance: 42.9 mL/min (A) (by C-G formula based on SCr of 1.08 mg/dL (H)).  CBG: Recent Labs  Lab 08/06/20 0819 08/06/20 1119 08/06/20 1146 08/06/20 1538 08/06/20 1657  GLUCAP 193* 147* 116* 160* 204*     Recent Results (from the past 240 hour(s))  Resp Panel by RT-PCR (Flu A&B, Covid) Nasopharyngeal Swab     Status: None   Collection Time: 08/06/20  2:00 AM   Specimen: Nasopharyngeal Swab; Nasopharyngeal(NP) swabs in vial transport medium  Result Value Ref Range Status   SARS Coronavirus 2 by RT PCR NEGATIVE NEGATIVE Final    Comment: (NOTE) SARS-CoV-2 target nucleic acids are NOT DETECTED.  The SARS-CoV-2 RNA is generally detectable in upper respiratory specimens during the acute phase of infection. The lowest concentration of SARS-CoV-2 viral copies this assay can detect is 138 copies/mL. A negative result does not preclude SARS-Cov-2 infection and should not be used as the sole basis for treatment or other  patient management decisions. A negative result may occur with  improper specimen collection/handling, submission of specimen other than nasopharyngeal swab, presence of viral mutation(s) within the areas targeted by this assay, and inadequate number of viral copies(<138 copies/mL). A negative result must be combined with clinical observations, patient history, and epidemiological information. The expected result is Negative.  Fact Sheet for Patients:  EntrepreneurPulse.com.au  Fact Sheet for Healthcare Providers:  IncredibleEmployment.be  This test is no t yet approved or cleared by the [place] FDA and  has been authorized for detection and/or diagnosis of SARS-CoV-2 by FDA under an Emergency Use Authorization (EUA). This EUA will remain  in effect (meaning this test can be used) for the duration of the COVID-19 declaration under Section 564(b)(1) of the Act, 21 U.S.C.section 360bbb-3(b)(1), unless the authorization is terminated  or revoked sooner.       Influenza A by PCR NEGATIVE NEGATIVE Final   Influenza B by PCR NEGATIVE NEGATIVE Final    Comment: (NOTE) The Xpert Xpress SARS-CoV-2/FLU/RSV plus assay is intended as an aid in the diagnosis of influenza from Nasopharyngeal swab specimens and should not be used as a  sole basis for treatment. Nasal washings and aspirates are unacceptable for Xpert Xpress SARS-CoV-2/FLU/RSV testing.  Fact Sheet for Patients: EntrepreneurPulse.com.au  Fact Sheet for Healthcare Providers: IncredibleEmployment.be  This test is not yet approved or cleared by the [place] FDA and has been authorized for detection and/or diagnosis of SARS-CoV-2 by FDA under an Emergency Use Authorization (EUA). This EUA will remain in effect (meaning this test can be used) for the duration of the COVID-19 declaration under Section 564(b)(1) of the Act, 21 U.S.C. section  360bbb-3(b)(1), unless the authorization is terminated or revoked.  Performed at [facility], [address] [place], [place] [address]   Surgical pcr screen     Status: None   Collection Time: 08/06/20  8:40 AM   Specimen: Nasal Mucosa; Nasal Swab  Result Value Ref Range Status   MRSA, PCR NEGATIVE NEGATIVE Final   Staphylococcus aureus NEGATIVE NEGATIVE Final    Comment: (NOTE) The Xpert SA Assay (FDA approved for NASAL specimens in patients 10 years of age and older), is one component of a comprehensive surveillance program. It is not intended to diagnose infection nor to guide or monitor treatment. Performed at [facility], [address], [place], [place] [address]      Radiology Studies: [facility] 1 View  Result Date: 08/06/2020 CLINICAL DATA:  Recent fall with right hip fracture, initial encounter EXAM: PORTABLE CHEST 1 VIEW COMPARISON:  03/02/2020 FINDINGS: Cardiac shadows within normal limits. Aortic calcifications are noted. The lungs are clear. Postsurgical changes in the neck are noted. No acute bony abnormality is seen. IMPRESSION: No active disease. Electronically Signed   By: Inez Catalina M.D.   On: 08/06/2020 00:20   DG Knee Left Port  Result Date: 08/06/2020 CLINICAL DATA:  Chronic left knee pain EXAM: PORTABLE LEFT KNEE - 2 VIEW COMPARISON:  None. FINDINGS: Tricompartmental degenerative changes are noted without acute fracture or dislocation. Mild lateral displacement of the tibia with respect to the distal femur is seen. No joint effusion is noted. IMPRESSION: Chronic tricompartmental degenerative changes. Electronically Signed   By: Inez Catalina M.D.   On: 08/06/2020 01:18   DG Knee Right Port  Result Date: 08/06/2020 CLINICAL DATA:  Known right hip fracture, right knee pain, initial encounter EXAM: PORTABLE RIGHT KNEE - 2 VIEW COMPARISON:  None. FINDINGS: Tricompartmental degenerative changes are noted similar to that seen on the left. No acute fracture or  dislocation is noted. IMPRESSION: Degenerative change without acute abnormality. Electronically Signed   By: Inez Catalina M.D.   On: 08/06/2020 01:18   DG HIP OPERATIVE UNILAT WITH PELVIS RIGHT  Result Date: 08/06/2020 CLINICAL DATA:  Right hip fracture fixation EXAM: OPERATIVE RIGHT HIP (WITH PELVIS IF PERFORMED) 2 VIEWS TECHNIQUE: Fluoroscopic spot image(s) were submitted for interpretation post-operatively. COMPARISON:  None. FINDINGS: Multiple images are submitted. There is fixation of intertrochanteric fracture with placement of intramedullary rod and single distal interlocking screw as well as intertrochanteric nail. IMPRESSION: Post fixation of right femur fracture. Electronically Signed   By: Macy Mis M.D.   On: 08/06/2020 20:05   DG HIP UNILAT WITH PELVIS 2-3 VIEWS LEFT  Result Date: 08/06/2020 CLINICAL DATA:  Chronic hip pain EXAM: DG HIP (WITH OR WITHOUT PELVIS) 2V LEFT COMPARISON:  None. FINDINGS: Visualized pelvic ring appears intact. Mild degenerative changes of the left hip joint are seen. No acute fracture or dislocation is noted. No soft tissue abnormality is seen. IMPRESSION: No acute abnormality noted. Previously seen lucency is not duplicated on  these dedicated images. Electronically Signed   By: Inez Catalina M.D.   On: 08/06/2020 01:17   DG Hip Unilat W or Wo Pelvis 2-3 Views Right  Result Date: 08/06/2020 CLINICAL DATA:  Recent trip and fall with hip pain, initial encounter EXAM: DG HIP (WITH OR WITHOUT PELVIS) 2-3V RIGHT COMPARISON:  None. FINDINGS: Undisplaced intratrochanteric fracture of the proximal right femur is noted. No dislocation is seen. Pelvic ring is intact. Degenerative changes of lumbar spine are noted as well. Some lucency is noted over the proximal left femur as well felt to be projectional in nature although follow-up films would be helpful if there is any left-sided pain. IMPRESSION: Undisplaced right intratrochanteric femoral fracture. Lucency is noted  over the proximal left hip although incompletely evaluated on this exam. If any left-sided pain is present left hip films would be helpful. Electronically Signed   By: Inez Catalina M.D.   On: 08/06/2020 00:00     Scheduled Meds:  ALPRAZolam  0.25 mg Oral q morning   aspirin EC  325 mg Oral Q breakfast   Chlorhexidine Gluconate Cloth  6 each Topical Daily   cholecalciferol  1,000 Units Oral Daily   docusate sodium  100 mg Oral BID   enalapril  2.5 mg Oral Daily   ferrous sulfate  325 mg Oral BID WC   fluticasone  1 spray Each Nare BID   insulin aspart  0-9 Units Subcutaneous TID AC & HS   insulin detemir  8 Units Subcutaneous Daily   levothyroxine  88 mcg Oral QAC breakfast   loratadine  10 mg Oral Daily   omega-3 acid ethyl esters  1 g Oral Daily   pantoprazole  40 mg Oral Daily   pravastatin  40 mg Oral q1800   sodium chloride  1 g Oral Daily   traMADol  50 mg Oral Q6H   vitamin B-12  1,000 mcg Oral Daily   Continuous Infusions:  methocarbamol (ROBAXIN) IV       LOS: 1 day    Time spent: [place], MD [facility]   To contact the attending provider between 7A-7P or the covering provider during after hours 7P-7A, please log into the web site [email or web address] and access using universal [place] password for that web site. If you do not have the password, please call the hospital operator.  08/07/2020, 4:22 PM

## 2020-08-08 DIAGNOSIS — D649 Anemia, unspecified: Secondary | ICD-10-CM | POA: Diagnosis present

## 2020-08-08 LAB — CBC
HCT: 24.5 % — ABNORMAL LOW (ref 36.0–46.0)
Hemoglobin: 7.9 g/dL — ABNORMAL LOW (ref 12.0–15.0)
MCH: 30.5 pg (ref 26.0–34.0)
MCHC: 32.2 g/dL (ref 30.0–36.0)
MCV: 94.6 fL (ref 80.0–100.0)
Platelets: 145 10*3/uL — ABNORMAL LOW (ref 150–400)
RBC: 2.59 MIL/uL — ABNORMAL LOW (ref 3.87–5.11)
RDW: 14 % (ref 11.5–15.5)
WBC: 8.4 10*3/uL (ref 4.0–10.5)
nRBC: 0 % (ref 0.0–0.2)

## 2020-08-08 LAB — BASIC METABOLIC PANEL
Anion gap: 8 (ref 5–15)
BUN: 12 mg/dL (ref 8–23)
CO2: 25 mmol/L (ref 22–32)
Calcium: 8.7 mg/dL — ABNORMAL LOW (ref 8.9–10.3)
Chloride: 94 mmol/L — ABNORMAL LOW (ref 98–111)
Creatinine, Ser: 0.91 mg/dL (ref 0.44–1.00)
GFR, Estimated: >60 mL/min (ref >60)
Glucose, Bld: 171 mg/dL — ABNORMAL HIGH (ref 70–99)
Potassium: 4 mmol/L (ref 3.5–5.1)
Sodium: 127 mmol/L — ABNORMAL LOW (ref 135–145)

## 2020-08-08 LAB — GLUCOSE, CAPILLARY
Glucose-Capillary: 202 mg/dL — ABNORMAL HIGH (ref 70–99)
Glucose-Capillary: 177 mg/dL — ABNORMAL HIGH (ref 70–99)
Glucose-Capillary: 213 mg/dL — ABNORMAL HIGH (ref 70–99)
Glucose-Capillary: 165 mg/dL — ABNORMAL HIGH (ref 70–99)
Glucose-Capillary: 170 mg/dL — ABNORMAL HIGH (ref 70–99)
Glucose-Capillary: 169 mg/dL — ABNORMAL HIGH (ref 70–99)

## 2020-08-08 MED ORDER — ENALAPRIL MALEATE 10 MG PO TABS: 10.0000 mg | ORAL_TABLET | Freq: Every day | ORAL | 4 refills | Status: DC

## 2020-08-08 MED ORDER — SODIUM CHLORIDE 1 G PO TABS: 1.0000 g | ORAL_TABLET | Freq: Every day | ORAL | 1 refills | Status: AC

## 2020-08-08 MED ORDER — FERROUS SULFATE 325 (65 FE) MG PO TABS: 325.0000 mg | ORAL_TABLET | Freq: Two times a day (BID) | ORAL | 5 refills | Status: AC

## 2020-08-08 MED ORDER — ACETAMINOPHEN 325 MG PO TABS: 650.0000 mg | ORAL_TABLET | Freq: Four times a day (QID) | ORAL | 2 refills | Status: AC | PRN

## 2020-08-08 MED ORDER — POLYETHYLENE GLYCOL 3350 17 G PO PACK: 17.0000 g | PACK | Freq: Two times a day (BID) | ORAL | 3 refills | Status: AC

## 2020-08-08 MED ORDER — SENNOSIDES-DOCUSATE SODIUM 8.6-50 MG PO TABS: 2.0000 | ORAL_TABLET | Freq: Every day | ORAL | 5 refills | Status: AC

## 2020-08-08 MED ORDER — OXYCODONE HCL 5 MG PO TABS: 5.0000 mg | ORAL_TABLET | Freq: Four times a day (QID) | ORAL | 0 refills | Status: AC | PRN

## 2020-08-08 NOTE — Discharge Summary (Signed)
Madeline Fox, is a 75 y.o. female  DOB Jul 26, 1945  MRN [id number].  Admission date:  08/05/2020  Admitting Physician  Christel Mormon, MD  Discharge Date:  08/08/2020   Primary MD  Zhou-Talbert, Elwyn Lade, MD  Recommendations for primary care physician for things to follow:   1)Avoid ibuprofen/Advil/Aleve/Motrin/Goody Powders/Naproxen/BC powders/Meloxicam/Diclofenac/Indomethacin and other Nonsteroidal anti-inflammatory medications as these will make you more likely to bleed and can cause stomach ulcers, can also cause Kidney problems.   2)Repeat CBC and Repeat BMP Blood test on Monday 08/12/20  3)Avoid constipation  4)Rt Hip Surgery  Postoperative plan Weightbearing as tolerated Anticoagulation with full dose Aspirin 325 mg daily with Food for 28 days Follow-up visit with Dr Arther Abbott (orthopedics) at 4 weeks for x-rays and then x-rays at and 12 weeks   Admission Diagnosis  Hyponatremia [E87.1] Fall [W19.XXXA] Closed right hip fracture ([place]) [S72.001A] Closed fracture of right hip, initial encounter [facility]) [S72.001A]   Discharge Diagnosis  Hyponatremia [E87.1] Fall [W19.XXXA] Closed right hip fracture ([place]) [S72.001A] Closed fracture of right hip, initial encounter ([place]) [S72.001A]    Principal Problem:   Closed right hip fracture ([place]) Active Problems:   Acute on chronic anemia   Essential hypertension, benign   Postsurgical hypothyroidism   Type 2 diabetes mellitus with stage 3a chronic kidney disease, without long-term current use of insulin ([place])   Mixed hyperlipidemia      Past Medical History:  Diagnosis Date   Anemia    iron deficiency anemia   Anxiety    Arthritis    Breast cancer ([place]) 2007   LT LUMPECTOMY   Cancer ([place]) 2007   L BREAST CANCER - CHEMO/RADIATION /LUMPECTOMY     Chronic kidney disease    stage 3   Complication of anesthesia    Diabetes mellitus without  complication (HCC)    Type 2   Diverticulosis of colon with hemorrhage    Goiter    Heart murmur    Hyperlipidemia    Hypertension    Hyperthyroidism    Hypothyroidism    s/p thyroid ablation   Internal hemorrhoids    Neuromuscular disorder ([place])    NEUROPATHY IN TOES   Nocturia    Ovarian carcinoma in situ    Personal history of chemotherapy    Personal history of radiation therapy    Pneumonia    Psoriasis    S/P chemotherapy, time since greater than 12 weeks 2007   BREAST CA   S/P radiation > 12 weeks 2007   BREAST CA   Thrombocytopenia ([place])    Wears dentures    partial upper    Past Surgical History:  Procedure Laterality Date   ABDOMINAL HYSTERECTOMY     APPENDECTOMY     BREAST BIOPSY Left 2007   POS   BREAST LUMPECTOMY     CATARACT EXTRACTION W/PHACO Right 09/29/2017   Procedure: CATARACT EXTRACTION PHACO AND INTRAOCULAR LENS PLACEMENT ([place])  RIGHT DIABETIC;  Surgeon: Leandrew Koyanagi, MD;  Location: [facility];  Service: Ophthalmology;  Laterality: Right;  Diabetic - oral meds   CATARACT EXTRACTION W/PHACO Left 02/09/2018   Procedure: CATARACT EXTRACTION PHACO AND INTRAOCULAR LENS PLACEMENT ([facility]) LEFT DIABETIC;  Surgeon: Brasington, Chadwick, MD;  Location: [facility];  Service: Ophthalmology;  Laterality: Left;   CHOLECYSTECTOMY     COLONOSCOPY WITH PROPOFOL N/A 04/12/2015   Procedure: COLONOSCOPY WITH PROPOFOL;  Surgeon: Paul Y Oh, MD;  Location: [facility];  Service: Gastroenterology;  Laterality: N/A;   FINE NEEDLE ASPIRATION Left    left upper lobe thyroid - benign   INTRAMEDULLARY (IM) NAIL INTERTROCHANTERIC Right 08/06/2020   Procedure: INTRAMEDULLARY (IM) NAIL INTERTROCHANTRIC;  Surgeon: Harrison, Stanley E, MD;  Location: [facility];  Service: Orthopedics;  Laterality: Right;   MASTECTOMY PARTIAL / LUMPECTOMY Left 2007   chemo and radiation   OOPHORECTOMY     for carcinoma in situ   THYROIDECTOMY  11/19/2011   Procedure:  THYROIDECTOMY;  Surgeon: Todd M Gerkin, MD;  Location: [facility];  Service: General;  Laterality: N/A;  Total Thyroidectomy   THYROIDECTOMY         HPI  from the history and physical done on the day of admission:    Madeline Fox is a 75 y.o. Caucasian female with medical history significant for hypertension, dyslipidemia, hypothyroidism, stage III chronic kidney disease, type 2 diabetes mellitus, who presented to the emergency room with acute onset of right hip pain after having an accidental mechanical fall tripping over an air mattress.  She denied any head injury, presyncope or syncope.  No chest pain or palpitations.  No paresthesias or focal muscle weakness.  She denied any fever or chills.  No dysuria, oliguria or hematuria or flank pain.  She had mild headache in the ER.  After her fall she could not ambulate. ED Course: When she came to the ER blood pressure was 188/73 with otherwise normal vital signs.  Labs revealed unremarkable BMP.  CBC was within normal.  Influenza antigens and COVID-19 PCR came back negative.   EKG as reviewed by me : Showed normal sinus rhythm with a rate of 86 with RSR-and RVH. Imaging: Portable chest x-ray showed no acute cardiopulmonary disease.  Left hip and knee x-ray showed no acute abnormalities.  Left knee x-ray showed chronic tricompartmental degenerative changes. Right knee x-ray showed degenerative changes without acute abnormality.  Right hip x-ray showed nondisplaced right anterior trochanteric femoral fracture.  The patient was given 50 mcg of IV fentanyl and 0.25 mg of p.o. Xanax as well as 500 mill of IV normal saline.  She will be admitted to a medical-surgical bed for further evaluation and management.     Hospital Course:   1-closed right hip fracture (HCC) -Secondary to mechanical fall at home -S/p ORIF 08/06/2020 -Weightbearing as tolerated -Full dose aspirin for DVT prophylaxis (planning for 28 days treatment) -outpatient follow up with  ortho in 4 weeks for x-rays -Patient was seen by physical therapy with recommendation for a skilled nursing facility; patient and family has declined nursing home and instead requesting home health services. --   2-hyponatremia -continue maintaining adequate hydration -Sodium is up to 127 from 121 on 08/07/2020 -Continue sodium chloride tablets -Repeat BMP on Monday, 08/12/2020   3-type 2 diabetes -Okay to resume Januvia and Actos   4-bilateral knees pain -use Aspercreme topical -Patient receiving pain medication for her hip fracture   5-hypothyroidism -Continue Synthroid.   6-makes hyperlipidemia -Continue statins and fish oil.   7-essential hypertension -Continue Vasotec -Blood pressure stable     8-chronic kidney disease stage IIIb -Appears to be stable and at baseline -. 9) anemia--- hemoglobin is down to 7.9 postoperatively -Repeat CBC on Monday, 08/12/2020 advised     DVT prophylaxis: Full dose aspirin Code Status: DNR Family Communication: Daughter at bedside. Disposition:     Dispo: The patient is from: Home              Anticipated d/c is to: Home                    Consultants:  Orthopedic service   Procedures: S/p right hip ORIF on 08/06/20   Disposition--patient and family declines SNF rehab Risk of falls/injuries Discussed with patient and daughter at bedside -They insist on discharge home with home health services--family members apparently will stay with patient (patient states they will take turns to help her out at home)  Discharge Condition: stable   Follow UP   Follow-up Information     [facility], [place] Follow up.   Specialty: [place] Why: [facility] staff will call to schedule in home visits Contact information: [place] [address] [phone]         Carole Civil, MD. Schedule an appointment as soon as possible for a visit in 2 week(s).   Specialties: Orthopedic  Surgery, Radiology Why: Hip xrays Contact information: [address] [place] [address] [phone]                  Consults obtained -   Diet and Activity recommendation:  As advised  Discharge Instructions    * Discharge Instructions     Call MD for:  difficulty breathing, headache or visual disturbances   Complete by: As directed    Call MD for:  persistant dizziness or light-headedness   Complete by: As directed    Call MD for:  persistant nausea and vomiting   Complete by: As directed    Call MD for:  redness, tenderness, or signs of infection (pain, swelling, redness, odor or green/yellow discharge around incision site)   Complete by: As directed    Call MD for:  severe uncontrolled pain   Complete by: As directed    Call MD for:  temperature >100.4   Complete by: As directed    Diet - low sodium heart healthy   Complete by: As directed    Diet Carb Modified   Complete by: As directed    Discharge instructions   Complete by: As directed    1)Avoid ibuprofen/Advil/Aleve/Motrin/Goody Powders/Naproxen/BC powders/Meloxicam/Diclofenac/Indomethacin and other Nonsteroidal anti-inflammatory medications as these will make you more likely to bleed and can cause stomach ulcers, can also cause Kidney problems.   2)Repeat CBC and Repeat BMP Blood test on Monday 08/12/20  3)Avoid constipation  4)Rt Hip Surgery  Postoperative plan Weightbearing as tolerated Anticoagulation with full dose Aspirin 325 mg daily with Food for 28 days Follow-up visit with Dr Arther Abbott (orthopedics) at 4 weeks for x-rays and then x-rays at and 12 weeks   Discharge wound care:   Complete by: As directed    Keep wound clean and dry   Increase activity slowly   Complete by: As directed          Discharge Medications     Allergies as of 08/08/2020       Reactions   Augmentin [amoxicillin-pot Clavulanate]    nervousness        Medication List  STOP taking  these medications    aspirin 81 MG tablet       TAKE these medications    Accu-Chek Aviva Plus test strip Generic drug: glucose blood USE UP TO 2 TO 3 TIMES DAILY AS DIRECTED   Accu-Chek FastClix Lancets Misc Pt  Test BG tid   acetaminophen 325 MG tablet Commonly known as: TYLENOL Take 2 tablets (650 mg total) by mouth every 6 (six) hours as needed for mild pain, fever or headache.   ALPRAZolam 0.25 MG tablet Commonly known as: XANAX Take 0.25 mg by mouth daily as needed for anxiety.   B-D SINGLE USE SWABS REGULAR Pads USE TWICE DAILY   BLINK TEARS OP Apply 1 drop to eye daily as needed (dry eye).   blood glucose meter kit and supplies Kit Dispense based on patient and insurance preference. Use up to 2-3 times daily as directed. (FOR ICD-10 E11.65).   cetirizine 10 MG tablet Commonly known as: ZYRTEC Take 10 mg by mouth at bedtime.   enalapril 10 MG tablet Commonly known as: VASOTEC Take 1 tablet (10 mg total) by mouth daily. What changed:  medication strength how much to take   ferrous sulfate 325 (65 FE) MG tablet Take 1 tablet (325 mg total) by mouth 2 (two) times daily with a meal. What changed: when to take this   Fish Oil 1000 MG Caps Take 1,000 mg by mouth daily.   fluticasone 50 MCG/ACT nasal spray Commonly known as: FLONASE Place 1 spray into both nostrils daily as needed for allergies.   levothyroxine 88 MCG tablet Commonly known as: SYNTHROID Take 1 tablet (88 mcg total) by mouth daily before breakfast.   lovastatin 40 MG tablet Commonly known as: MEVACOR Take 40 mg by mouth at bedtime.   omeprazole 40 MG capsule Commonly known as: PRILOSEC Take 40 mg daily by mouth.   oxyCODONE 5 MG immediate release tablet Commonly known as: Oxy IR/ROXICODONE Take 1-2 tablets (5-10 mg total) by mouth every 6 (six) hours as needed for up to 5 days for severe pain.   pioglitazone 15 MG tablet Commonly known as: ACTOS TAKE 1 TABLET (15 MG TOTAL) BY  MOUTH DAILY.   polyethylene glycol 17 g packet Commonly known as: MiraLax Take 17 g by mouth 2 (two) times daily.   senna-docusate 8.6-50 MG tablet Commonly known as: Senokot-S Take 2 tablets by mouth at bedtime.   sitaGLIPtin 50 MG tablet Commonly known as: JANUVIA Take 50 mg by mouth daily.   sodium chloride 1 g tablet Take 1 tablet (1 g total) by mouth daily. Start taking on: August 09, 2020   vitamin B-12 1000 MCG tablet Commonly known as: CYANOCOBALAMIN Take 1,000 mcg by mouth daily.   Vitamin D3 25 MCG (1000 UT) Caps Take 1,000 Units by mouth daily.               Durable Medical Equipment  (From admission, onward)           Start     Ordered   08/07/20 1449  For home use only DME 3 n 1  Once        08/07/20 1452              Discharge Care Instructions  (From admission, onward)           Start     Ordered   08/08/20 0000  Discharge wound care:       Comments: Keep wound clean and dry     08/08/20 1307            Major procedures and Radiology Reports - PLEASE review detailed and final reports for all details, in brief -   DG Chest Port 1 View  Result Date: 08/06/2020 CLINICAL DATA:  Recent fall with right hip fracture, initial encounter EXAM: PORTABLE CHEST 1 VIEW COMPARISON:  03/02/2020 FINDINGS: Cardiac shadows within normal limits. Aortic calcifications are noted. The lungs are clear. Postsurgical changes in the neck are noted. No acute bony abnormality is seen. IMPRESSION: No active disease. Electronically Signed   By: Mark  Lukens M.D.   On: 08/06/2020 00:20   DG Knee Left Port  Result Date: 08/06/2020 CLINICAL DATA:  Chronic left knee pain EXAM: PORTABLE LEFT KNEE - 2 VIEW COMPARISON:  None. FINDINGS: Tricompartmental degenerative changes are noted without acute fracture or dislocation. Mild lateral displacement of the tibia with respect to the distal femur is seen. No joint effusion is noted. IMPRESSION: Chronic tricompartmental  degenerative changes. Electronically Signed   By: Mark  Lukens M.D.   On: 08/06/2020 01:18   DG Knee Right Port  Result Date: 08/06/2020 CLINICAL DATA:  Known right hip fracture, right knee pain, initial encounter EXAM: PORTABLE RIGHT KNEE - 2 VIEW COMPARISON:  None. FINDINGS: Tricompartmental degenerative changes are noted similar to that seen on the left. No acute fracture or dislocation is noted. IMPRESSION: Degenerative change without acute abnormality. Electronically Signed   By: Mark  Lukens M.D.   On: 08/06/2020 01:18   DG HIP OPERATIVE UNILAT WITH PELVIS RIGHT  Result Date: 08/06/2020 CLINICAL DATA:  Right hip fracture fixation EXAM: OPERATIVE RIGHT HIP (WITH PELVIS IF PERFORMED) 2 VIEWS TECHNIQUE: Fluoroscopic spot image(s) were submitted for interpretation post-operatively. COMPARISON:  None. FINDINGS: Multiple images are submitted. There is fixation of intertrochanteric fracture with placement of intramedullary rod and single distal interlocking screw as well as intertrochanteric nail. IMPRESSION: Post fixation of right femur fracture. Electronically Signed   By: Praneil  Patel M.D.   On: 08/06/2020 20:05   DG HIP UNILAT WITH PELVIS 2-3 VIEWS LEFT  Result Date: 08/06/2020 CLINICAL DATA:  Chronic hip pain EXAM: DG HIP (WITH OR WITHOUT PELVIS) 2V LEFT COMPARISON:  None. FINDINGS: Visualized pelvic ring appears intact. Mild degenerative changes of the left hip joint are seen. No acute fracture or dislocation is noted. No soft tissue abnormality is seen. IMPRESSION: No acute abnormality noted. Previously seen lucency is not duplicated on these dedicated images. Electronically Signed   By: Mark  Lukens M.D.   On: 08/06/2020 01:17   DG Hip Unilat W or Wo Pelvis 2-3 Views Right  Result Date: 08/06/2020 CLINICAL DATA:  Recent trip and fall with hip pain, initial encounter EXAM: DG HIP (WITH OR WITHOUT PELVIS) 2-3V RIGHT COMPARISON:  None. FINDINGS: Undisplaced intratrochanteric fracture of the  proximal right femur is noted. No dislocation is seen. Pelvic ring is intact. Degenerative changes of lumbar spine are noted as well. Some lucency is noted over the proximal left femur as well felt to be projectional in nature although follow-up films would be helpful if there is any left-sided pain. IMPRESSION: Undisplaced right intratrochanteric femoral fracture. Lucency is noted over the proximal left hip although incompletely evaluated on this exam. If any left-sided pain is present left hip films would be helpful. Electronically Signed   By: Mark  Lukens M.D.   On: 08/06/2020 00:00    Micro Results  Recent Results (from the past 240 hour(s))  Resp Panel by RT-PCR (Flu A&B, Covid) Nasopharyngeal   Swab     Status: None   Collection Time: 08/06/20  2:00 AM   Specimen: Nasopharyngeal Swab; Nasopharyngeal(NP) swabs in vial transport medium  Result Value Ref Range Status   SARS Coronavirus 2 by RT PCR NEGATIVE NEGATIVE Final    Comment: (NOTE) SARS-CoV-2 target nucleic acids are NOT DETECTED.  The SARS-CoV-2 RNA is generally detectable in upper respiratory specimens during the acute phase of infection. The lowest concentration of SARS-CoV-2 viral copies this assay can detect is 138 copies/mL. A negative result does not preclude SARS-Cov-2 infection and should not be used as the sole basis for treatment or other patient management decisions. A negative result may occur with  improper specimen collection/handling, submission of specimen other than nasopharyngeal swab, presence of viral mutation(s) within the areas targeted by this assay, and inadequate number of viral copies(<138 copies/mL). A negative result must be combined with clinical observations, patient history, and epidemiological information. The expected result is Negative.  Fact Sheet for Patients:  https://www.fda.gov/media/152166/download  Fact Sheet for Healthcare Providers:  https://www.fda.gov/media/152162/download  This  test is no t yet approved or cleared by the [place] FDA and  has been authorized for detection and/or diagnosis of SARS-CoV-2 by FDA under an Emergency Use Authorization (EUA). This EUA will remain  in effect (meaning this test can be used) for the duration of the COVID-19 declaration under Section 564(b)(1) of the Act, 21 U.S.C.section 360bbb-3(b)(1), unless the authorization is terminated  or revoked sooner.       Influenza A by PCR NEGATIVE NEGATIVE Final   Influenza B by PCR NEGATIVE NEGATIVE Final    Comment: (NOTE) The Xpert Xpress SARS-CoV-2/FLU/RSV plus assay is intended as an aid in the diagnosis of influenza from Nasopharyngeal swab specimens and should not be used as a sole basis for treatment. Nasal washings and aspirates are unacceptable for Xpert Xpress SARS-CoV-2/FLU/RSV testing.  Fact Sheet for Patients: https://www.fda.gov/media/152166/download  Fact Sheet for Healthcare Providers: https://www.fda.gov/media/152162/download  This test is not yet approved or cleared by the [place] FDA and has been authorized for detection and/or diagnosis of SARS-CoV-2 by FDA under an Emergency Use Authorization (EUA). This EUA will remain in effect (meaning this test can be used) for the duration of the COVID-19 declaration under Section 564(b)(1) of the Act, 21 U.S.C. section 360bbb-3(b)(1), unless the authorization is terminated or revoked.  Performed at [facility], [address] [place], [place] [address]   Surgical pcr screen     Status: None   Collection Time: 08/06/20  8:40 AM   Specimen: Nasal Mucosa; Nasal Swab  Result Value Ref Range Status   MRSA, PCR NEGATIVE NEGATIVE Final   Staphylococcus aureus NEGATIVE NEGATIVE Final    Comment: (NOTE) The Xpert SA Assay (FDA approved for NASAL specimens in patients 22 years of age and older), is one component of a comprehensive surveillance program. It is not intended to diagnose infection nor to guide or  monitor treatment. Performed at [facility], [address] [place],  [address]        Today   Subjective    Madeline Fox today has no new complaints  No fever  Or chills   No Nausea, Vomiting or Diarrhea  -patient and family declines SNF rehab Risk of falls/injuries Discussed with patient and daughter at bedside -They insist on discharge home with home health services--family members apparently will stay with patient (patient states they will take turns to help her out at home)             Patient has been seen and examined prior to discharge   Objective   Blood pressure (!) 130/46, pulse 95, temperature 98.2 F (36.8 C), resp. rate 19, height 5' 3" (1.6 m), weight 77.1 kg, SpO2 99 %.   Intake/Output Summary (Last 24 hours) at 08/08/2020 1309 Last data filed at 08/08/2020 0621 Gross per 24 hour  Intake --  Output 300 ml  Net -300 ml    Exam Gen:- Awake Alert, no acute distress  HEENT:- Deport.AT, No sclera icterus Neck-Supple Neck,No JVD,.  Lungs-  CTAB , good air movement bilaterally  CV- S1, S2 normal, regular Abd-  +ve B.Sounds, Abd Soft, No tenderness,    Extremity/Skin:- No  edema,   good pulses Psych-affect is appropriate, oriented x3 Neuro-no new focal deficits, no tremors  MSK-Rt hip postoperative wound clean dry and intact   Data Review   CBC w Diff:  Lab Results  Component Value Date   WBC 8.4 08/08/2020   HGB 7.9 (L) 08/08/2020   HGB 10.5 (L) 02/03/2013   HCT 24.5 (L) 08/08/2020   HCT 31.1 (L) 02/03/2013   PLT 145 (L) 08/08/2020   PLT 174 02/03/2013   LYMPHOPCT 12 08/06/2020   LYMPHOPCT 16.6 02/03/2013   MONOPCT 6 08/06/2020   MONOPCT 5.3 02/03/2013   EOSPCT 1 08/06/2020   EOSPCT 1.6 02/03/2013   BASOPCT 0 08/06/2020   BASOPCT 0.5 02/03/2013    CMP:  Lab Results  Component Value Date   NA 127 (L) 08/08/2020   K 4.0 08/08/2020   CL 94 (L) 08/08/2020   CO2 25 08/08/2020   BUN 12 08/08/2020   BUN 21 04/29/2016   CREATININE  0.91 08/08/2020   CREATININE 1.18 (H) 02/27/2020   PROT 6.8 02/27/2020   PROT 7.6 02/03/2013   ALBUMIN 3.9 02/03/2013   BILITOT 0.5 02/27/2020   BILITOT 0.3 02/03/2013   ALKPHOS 83 02/03/2013   AST 18 02/27/2020   AST 17 02/03/2013   ALT 9 02/27/2020   ALT 17 02/03/2013  .   Total Discharge time is about 33 minutes  Courage Emokpae M.D on 08/08/2020 at 1:09 PM  Go to [email or web address] -  for contact info  [facility] - Office  [phone]   

## 2020-08-08 NOTE — Plan of Care (Signed)

## 2020-08-08 NOTE — Care Management Important Message (Signed)
Important Message  Patient Details  Name: Madeline Fox MRN: [id number] Date of Birth: 13-Oct-1945   Medicare Important Message Given:  Yes     Tommy Medal 08/08/2020, 12:02 PM

## 2020-08-08 NOTE — Progress Notes (Signed)
Subjective: 2 Days Post-Op Procedure(s) (LRB): INTRAMEDULLARY (IM) NAIL INTERTROCHANTRIC (Right) Patient reports pain as mild.    Objective: Vital signs in last 24 hours: Temp:  [98.2 F (36.8 C)] 98.2 F (36.8 C) (07/14 0445) Pulse Rate:  [87-95] 95 (07/14 0445) Resp:  [19] 19 (07/14 0445) BP: (130-144)/(46-60) 130/46 (07/14 0445) SpO2:  [98 %-99 %] 99 % (07/14 0445)  Intake/Output from previous day: 07/13 0701 - 07/14 0700 In: 680 [P.O.:680] Out: 300 [Urine:300] Intake/Output this shift: No intake/output data recorded.  Recent Labs    08/06/20 0004 08/06/20 0458 08/07/20 0414 08/08/20 0540  HGB 10.4* 10.3* 8.6* 7.9*   Recent Labs    08/07/20 0414 08/08/20 0540  WBC 8.5 8.4  RBC 2.84* 2.59*  HCT 26.4* 24.5*  PLT 163 145*   Recent Labs    08/07/20 0414 08/08/20 0540  NA 121* 127*  K 3.9 4.0  CL 91* 94*  CO2 23 25  BUN 13 12  CREATININE 1.08* 0.91  GLUCOSE 207* 171*  CALCIUM 8.0* 8.7*   Recent Labs    08/06/20 0004 08/07/20 0414  INR 1.0 1.2    Neurologically intact Neurovascular intact Sensation intact distally Intact pulses distally Dorsiflexion/Plantar flexion intact Incision: scant drainage   Assessment/Plan: 2 Days Post-Op Procedure(s) (LRB): INTRAMEDULLARY (IM) NAIL INTERTROCHANTRIC (Right) Advance diet Up with therapy D/C IV fluids Discharge home with home health    Postoperative plan Weightbearing as tolerated Anticoagulation for 28 days Follow-up visit at 4 weeks for x-rays and then x-rays at and 12 weeks [place] 08/08/2020, 8:19 AM

## 2020-08-08 NOTE — Progress Notes (Addendum)
Physical Therapy Treatment Patient Details Name: Madeline Fox MRN: [id number] DOB: 06-08-1945 Today's Date: 08/08/2020    History of Present Illness Madeline Fox is a 75 y.o. Caucasian female with medical history significant for hypertension, dyslipidemia, hypothyroidism, stage III chronic kidney disease, type 2 diabetes mellitus, who presented to the emergency room with acute onset of right hip pain after having an accidental mechanical fall tripping over an air mattress.  She denied any head injury, presyncope or syncope.  No chest pain or palpitations.  No paresthesias or focal muscle weakness.  She denied any fever or chills.  No dysuria, oliguria or hematuria or flank pain.  She had mild headache in the ER.  After her fall she could not ambulate.    PT Comments    Patient presents in bed with family members present and agreeable to therapy. Patient required mod assisted for bed mobility and transfer to a seated position at EOB. Patient demonstrated fair seated balance with single UE support during therapeutic activity and exercise. Patient was required mod/max assist for sit to stand and stand pivot transfer with verbal and tactile cues on use of RW during ambulation. Patient required verbal cues for stand to sit into chair but was able to self assist to reposition in the chair. Patient will benefit from continued physical therapy in hospital and recommended venue below to increase strength, balance, endurance for safe ADLs and gait.   Follow Up Recommendations  SNF     Equipment Recommendations  None recommended by PT    Recommendations for Other Services       Precautions / Restrictions Precautions Precautions: Fall Restrictions Weight Bearing Restrictions: Yes RLE Weight Bearing: Weight bearing as tolerated    Mobility  Bed Mobility Overal bed mobility: Needs Assistance Bed Mobility: Supine to Sit     Supine to sit: Mod assist     General bed mobility comments:  limited due to pain in R hip, manual assist for RLE movement Patient Response: Cooperative;Anxious  Transfers Overall transfer level: Needs assistance Equipment used: Rolling walker (2 wheeled) Transfers: Sit to/from [facility] Sit to Stand: Mod assist Stand pivot transfers: Mod assist;Max assist       General transfer comment: slow labred movement, difficulty bearing weight on RLE due to pain, required verbal and tactile cues for steps to chair  Ambulation/Gait Ambulation/Gait assistance: Mod assist;Max assist Gait Distance (Feet): 4 Feet Assistive device: Rolling walker (2 wheeled) Gait Pattern/deviations: Decreased step length - right;Decreased step length - left;Decreased stance time - right;Decreased stride length;Decreased weight shift to right;Narrow base of support;Trunk flexed Gait velocity: decreased   General Gait Details: slow labored movement, increased time, limited to a few small steps at the bed side during transfer   Stairs             Wheelchair Mobility    Modified Rankin (Stroke Patients Only)       Balance Overall balance assessment: Needs assistance Sitting-balance support: Feet supported;Single extremity supported   Sitting balance - Comments: fair/poor at EOB   Standing balance support: Bilateral upper extremity supported;During functional activity Standing balance-Leahy Scale: Poor Standing balance comment: using RW                            Cognition Arousal/Alertness: Awake/alert Behavior During Therapy: WFL for tasks assessed/performed Overall Cognitive Status: Within Functional Limits for tasks assessed  Exercises General Exercises - Lower Extremity Long Arc Quad: Seated;AAROM;Strengthening;Both;5 reps Heel Slides: Seated;AAROM;Strengthening;Both;5 reps Toe Raises: Seated;AROM;Strengthening;Both;10 reps Heel Raises:  Seated;AROM;Strengthening;Both;10 reps    General Comments        Pertinent Vitals/Pain Pain Assessment: Faces Faces Pain Scale: Hurts whole lot Pain Location: R hip L knee Pain Descriptors / Indicators: Sore;Guarding;Grimacing Pain Intervention(s): Limited activity within patient's tolerance;Monitored during session;Premedicated before session;Repositioned    Home Living                      Prior Function            PT Goals (current goals can now be found in the care plan section) Acute Rehab PT Goals Patient Stated Goal: return home PT Goal Formulation: With patient/family Time For Goal Achievement: 08/09/20 Potential to Achieve Goals: Good Progress towards PT goals: Progressing toward goals    Frequency    Min 4X/week      PT Plan Current plan remains appropriate    Co-evaluation              AM-PAC PT "6 Clicks" Mobility   Outcome Measure  Help needed turning from your back to your side while in a flat bed without using bedrails?: A Little Help needed moving from lying on your back to sitting on the side of a flat bed without using bedrails?: A Lot Help needed moving to and from a bed to a chair (including a wheelchair)?: A Lot Help needed standing up from a chair using your arms (e.g., wheelchair or bedside chair)?: A Lot Help needed to walk in hospital room?: A Lot Help needed climbing 3-5 steps with a railing? : Total 6 Click Score: 12    End of Session   Activity Tolerance: Patient tolerated treatment well;Patient limited by pain Patient left: in chair;with family/visitor present;with call bell/phone within reach Nurse Communication: Mobility status PT Visit Diagnosis: Unsteadiness on feet (R26.81);Difficulty in walking, not elsewhere classified (R26.2);Muscle weakness (generalized) (M62.81)     Time:  -     Charges:                       11:55 AM, 08/08/20 Sinclair Ship SPT  12:25 PM, 08/08/20 Lonell Grandchild, MPT Physical  Therapist with [facility] [phone] office [phone] mobile phone

## 2020-08-08 NOTE — Discharge Instructions (Signed)
1)Avoid ibuprofen/Advil/Aleve/Motrin/Goody Powders/Naproxen/BC powders/Meloxicam/Diclofenac/Indomethacin and other Nonsteroidal anti-inflammatory medications as these will make you more likely to bleed and can cause stomach ulcers, can also cause Kidney problems.   2)Repeat CBC and Repeat BMP Blood test on Monday 08/12/20  3)Avoid constipation  4)Rt Hip Surgery  Postoperative plan Weightbearing as tolerated Anticoagulation with full dose Aspirin 325 mg daily with Food for 28 days Follow-up visit with Dr Arther Abbott (orthopedics) at 4 weeks for x-rays and then x-rays at and 12 weeks

## 2020-08-08 NOTE — TOC Transition Note (Signed)
Transition of Care [facility]) - CM/SW Discharge Note   Patient Details  Name: Madeline Fox MRN: [id number] Date of Birth: Jan 16, 1946  [facility] CM/SW Contact:  Shade Flood, LCSW Phone Number: 08/08/2020, 3:30 PM   Clinical Narrative:     Pt stable for dc today per MD. Pt with orders for [facility] PT and RN. Updated Cory at [place] and they are set to take on pt's care at home. Spoke with pt's daughter to update.  No other TOC needs for dc.  Final next level of care: [place] Barriers to Discharge: Barriers Resolved   Patient Goals and CMS Choice Patient states their goals for this hospitalization and ongoing recovery are:: go home CMS Medicare.gov Compare Post Acute Care list provided to:: Patient Represenative (must comment) Choice offered to / list presented to : Adult Children  Discharge Placement                       Discharge Plan and Services In-house Referral: Clinical Social Work   Post Acute Care Choice: Museum/gallery conservator, Home Health          DME Arranged: 3-N-1 DME Agency: [employer] Date DME Agency Contacted: 08/07/20   Representative spoke with at DME Agency: Freda Munro HH Arranged: PT, RN [facility] Agency: [place] Date [place]: 08/07/20   Representative spoke with at [place]: [place] ([place]) Interventions     Readmission Risk Interventions Readmission Risk Prevention Plan 08/07/2020  Transportation Screening Complete  Home Care Screening Complete  Medication Review (RN CM) Complete  Some recent data might be hidden

## 2020-08-09 ENCOUNTER — Telehealth: Payer: Self-pay | Admitting: Orthopedic Surgery

## 2020-08-09 NOTE — Telephone Encounter (Signed)
Pt's daughter called at 11:40 with questions regarding pt's medication instructions that was on the hospital discharge.  Would you call her please?

## 2020-08-09 NOTE — Telephone Encounter (Signed)
Called had to leave message   Looks like she needs ASA 325 for 28 days

## 2020-08-21 ENCOUNTER — Encounter: Payer: Medicare HMO | Admitting: Orthopedic Surgery

## 2020-08-21 NOTE — Telephone Encounter (Signed)
Patient did not attend appointment today, 08/22/20, for post operative visit, date of surgery, right hip, 08/06/20. Called patient; re-scheduling to next available day; aware.

## 2020-08-26 ENCOUNTER — Other Ambulatory Visit: Payer: Self-pay

## 2020-08-26 ENCOUNTER — Ambulatory Visit (INDEPENDENT_AMBULATORY_CARE_PROVIDER_SITE_OTHER): Payer: Medicare HMO | Admitting: Orthopedic Surgery

## 2020-08-26 DIAGNOSIS — Z9889 Other specified postprocedural states: Secondary | ICD-10-CM

## 2020-08-26 DIAGNOSIS — S72001D Fracture of unspecified part of neck of right femur, subsequent encounter for closed fracture with routine healing: Secondary | ICD-10-CM

## 2020-08-26 NOTE — Progress Notes (Signed)
POST OP   Chief Complaint  Patient presents with   Hip Pain    R/still having some pain, just at times. Moving around some everyday.    Encounter Diagnoses  Name Primary?   Closed fracture of right hip with routine healing, subsequent encounter Yes   Status post hip surgery july 12      PROCEDURE: PROCEDURE:  Procedure(s): INTRAMEDULLARY (IM) NAIL INTERTROCHANTRIC (Right)   Gamma nail 125 degrees short nail 180 x 11 mm 32.5 distal locking screw 95 mm lag screw   Findings 2 part nondisplaced fracture slight external rotation intertrochanteric fracture right hip  POV # 1, pod 20  Meds related: asa dvt prevention   Doing well with walker and home PT 2 x a week   F/ 3 weeks xrays   Continue PT

## 2020-09-16 ENCOUNTER — Ambulatory Visit: Payer: Medicare HMO

## 2020-09-16 ENCOUNTER — Ambulatory Visit (INDEPENDENT_AMBULATORY_CARE_PROVIDER_SITE_OTHER): Payer: Medicare HMO | Admitting: Orthopedic Surgery

## 2020-09-16 ENCOUNTER — Other Ambulatory Visit: Payer: Self-pay

## 2020-09-16 ENCOUNTER — Encounter: Payer: Self-pay | Admitting: Orthopedic Surgery

## 2020-09-16 DIAGNOSIS — S72001D Fracture of unspecified part of neck of right femur, subsequent encounter for closed fracture with routine healing: Secondary | ICD-10-CM

## 2020-09-16 NOTE — Progress Notes (Signed)
Chief Complaint  Patient presents with   Post-op Follow-up    08/06/20 right hip fracture    Madeline Fox still doing well having some weakness with her hip flexion but she says she is ambulating better and better she is using the walker she seems to be progressing well her x-ray looks good we will x-ray again in 4 weeks

## 2020-10-14 ENCOUNTER — Other Ambulatory Visit: Payer: Self-pay

## 2020-10-14 ENCOUNTER — Ambulatory Visit (INDEPENDENT_AMBULATORY_CARE_PROVIDER_SITE_OTHER): Payer: Medicare HMO | Admitting: Orthopedic Surgery

## 2020-10-14 ENCOUNTER — Ambulatory Visit: Payer: Medicare HMO

## 2020-10-14 DIAGNOSIS — S72001D Fracture of unspecified part of neck of right femur, subsequent encounter for closed fracture with routine healing: Secondary | ICD-10-CM | POA: Diagnosis not present

## 2020-10-14 NOTE — Progress Notes (Signed)
Chief Complaint  Patient presents with   Routine Post Op    DOS 08/05/20    Encounter Diagnosis  Name Primary?   Closed fracture of right hip with routine healing, subsequent encounter Yes    75 year old female had ORIF right hip July 12 with a short gamma nail she has made good improvements over the 10 weeks although she still uses her walker  She has good hip flexion normal rotation no pain  Her x-ray looks like the fracture is healed and there is no complication from the implant  She should continue gradual progressively increasing her ambulation and I will see her in 3 months no x-ray needed

## 2020-12-30 ENCOUNTER — Other Ambulatory Visit: Payer: Self-pay

## 2020-12-30 ENCOUNTER — Other Ambulatory Visit: Payer: Self-pay | Admitting: Nurse Practitioner

## 2020-12-30 DIAGNOSIS — I10 Essential (primary) hypertension: Secondary | ICD-10-CM

## 2020-12-30 DIAGNOSIS — E890 Postprocedural hypothyroidism: Secondary | ICD-10-CM

## 2020-12-31 LAB — COMPLETE METABOLIC PANEL WITH GFR
AG Ratio: 1.3 (calc) (ref 1.0–2.5)
ALT: 7 U/L (ref 6–29)
AST: 15 U/L (ref 10–35)
Albumin: 3.8 g/dL (ref 3.6–5.1)
Alkaline phosphatase (APISO): 96 U/L (ref 37–153)
BUN/Creatinine Ratio: 19 (calc) (ref 6–22)
BUN: 19 mg/dL (ref 7–25)
CO2: 26 mmol/L (ref 20–32)
Calcium: 9.2 mg/dL (ref 8.6–10.4)
Chloride: 98 mmol/L (ref 98–110)
Creat: 1.02 mg/dL — ABNORMAL HIGH (ref 0.60–1.00)
Globulin: 3 g/dL (calc) (ref 1.9–3.7)
Glucose, Bld: 172 mg/dL — ABNORMAL HIGH (ref 65–99)
Potassium: 4.8 mmol/L (ref 3.5–5.3)
Sodium: 133 mmol/L — ABNORMAL LOW (ref 135–146)
Total Bilirubin: 0.4 mg/dL (ref 0.2–1.2)
Total Protein: 6.8 g/dL (ref 6.1–8.1)
eGFR: 57 mL/min/{1.73_m2} — ABNORMAL LOW (ref >=60)

## 2020-12-31 LAB — T4, FREE: Free T4: 1.4 ng/dL (ref 0.8–1.8)

## 2020-12-31 LAB — TSH: TSH: 1.96 mIU/L (ref 0.40–4.50)

## 2021-01-06 ENCOUNTER — Encounter: Payer: Self-pay | Admitting: Nurse Practitioner

## 2021-01-06 ENCOUNTER — Other Ambulatory Visit: Payer: Self-pay

## 2021-01-06 ENCOUNTER — Ambulatory Visit (INDEPENDENT_AMBULATORY_CARE_PROVIDER_SITE_OTHER): Payer: Medicare HMO | Admitting: Nurse Practitioner

## 2021-01-06 VITALS — BP 179/67 | HR 67 | Ht 62.0 in | Wt 170.2 lb

## 2021-01-06 DIAGNOSIS — E782 Mixed hyperlipidemia: Secondary | ICD-10-CM

## 2021-01-06 DIAGNOSIS — E890 Postprocedural hypothyroidism: Secondary | ICD-10-CM

## 2021-01-06 DIAGNOSIS — N1831 Chronic kidney disease, stage 3a: Secondary | ICD-10-CM | POA: Diagnosis not present

## 2021-01-06 DIAGNOSIS — E1122 Type 2 diabetes mellitus with diabetic chronic kidney disease: Secondary | ICD-10-CM | POA: Diagnosis not present

## 2021-01-06 DIAGNOSIS — I10 Essential (primary) hypertension: Secondary | ICD-10-CM

## 2021-01-06 LAB — POCT GLYCOSYLATED HEMOGLOBIN (HGB A1C): HbA1c, POC (controlled diabetic range): 6.7 % (ref 0.0–7.0)

## 2021-01-06 NOTE — Progress Notes (Signed)
01/06/2021, 3:30 PM                            Endocrinology Follow Up Visit  Subjective:    Patient ID: Madeline Fox, female    DOB: August 15, 1945.  Madeline Fox is being engaged in follow-up  for management of currently controlled symptomatic type 2 diabetes, postsurgical hypothyroidism, hyperlipidemia. PMD:   Zhou-Talbert, Elwyn Lade, MD (Inactive).   Past Medical History:  Diagnosis Date   Anemia    iron deficiency anemia   Anxiety    Arthritis    Breast cancer ([place]) 2007   LT LUMPECTOMY   Cancer ([place]) 2007   L BREAST CANCER - CHEMO/RADIATION /LUMPECTOMY     Chronic kidney disease    stage 3   Complication of anesthesia    Diabetes mellitus without complication (HCC)    Type 2   Diverticulosis of colon with hemorrhage    Goiter    Heart murmur    Hyperlipidemia    Hypertension    Hyperthyroidism    Hypothyroidism    s/p thyroid ablation   Internal hemorrhoids    Neuromuscular disorder ([place])    NEUROPATHY IN TOES   Nocturia    Ovarian carcinoma in situ    Personal history of chemotherapy    Personal history of radiation therapy    Pneumonia    Psoriasis    S/P chemotherapy, time since greater than 12 weeks 2007   BREAST CA   S/P radiation > 12 weeks 2007   BREAST CA   Thrombocytopenia ([place])    Wears dentures    partial upper   Past Surgical History:  Procedure Laterality Date   ABDOMINAL HYSTERECTOMY     APPENDECTOMY     BREAST BIOPSY Left 2007   POS   BREAST LUMPECTOMY     CATARACT EXTRACTION W/PHACO Right 09/29/2017   Procedure: CATARACT EXTRACTION PHACO AND INTRAOCULAR LENS PLACEMENT ([place])  RIGHT DIABETIC;  Surgeon: Leandrew Koyanagi, MD;  Location: [facility];  Service: Ophthalmology;  Laterality: Right;  Diabetic - oral meds   CATARACT EXTRACTION W/PHACO Left 02/09/2018   Procedure: CATARACT EXTRACTION PHACO AND INTRAOCULAR LENS PLACEMENT ([place]) LEFT  DIABETIC;  Surgeon: Leandrew Koyanagi, MD;  Location: [facility];  Service: Ophthalmology;  Laterality: Left;   CHOLECYSTECTOMY     COLONOSCOPY WITH PROPOFOL N/A 04/12/2015   Procedure: COLONOSCOPY WITH PROPOFOL;  Surgeon: Hulen Luster, MD;  Location: [facility] ENDOSCOPY;  Service: Gastroenterology;  Laterality: N/A;   FINE NEEDLE ASPIRATION Left    left upper lobe thyroid - benign   INTRAMEDULLARY (IM) NAIL INTERTROCHANTERIC Right 08/06/2020   Procedure: INTRAMEDULLARY (IM) NAIL INTERTROCHANTRIC;  Surgeon: Carole Civil, MD;  Location: [facility];  Service: Orthopedics;  Laterality: Right;   MASTECTOMY PARTIAL / LUMPECTOMY Left 2007   chemo and radiation   OOPHORECTOMY     for carcinoma in situ   THYROIDECTOMY  11/19/2011   Procedure: THYROIDECTOMY;  Surgeon: Earnstine Regal, MD;  Location: [facility];  Service: General;  Laterality: N/A;  Total Thyroidectomy   THYROIDECTOMY  Social History   Socioeconomic History   Marital status: Married    Spouse name: Not on file   Number of children: Not on file   Years of education: Not on file   Highest education level: Not on file  Occupational History   Not on file  Tobacco Use   Smoking status: Never   Smokeless tobacco: Never  Vaping Use   Vaping Use: Never used  Substance and Sexual Activity   Alcohol use: No   Drug use: No   Sexual activity: Not on file  Other Topics Concern   Not on file  Social History Narrative   Not on file   Social Determinants of Health   Financial Resource Strain: Not on file  Food Insecurity: Not on file  Transportation Needs: Not on file  Physical Activity: Not on file  Stress: Not on file  Social Connections: Not on file   Outpatient Encounter Medications as of 01/06/2021  Medication Sig   ACCU-CHEK AVIVA PLUS test strip USE UP TO 2 TO 3 TIMES DAILY AS DIRECTED   Accu-Chek FastClix Lancets MISC Pt  Test BG tid   acetaminophen (TYLENOL) 325 MG tablet Take 2 tablets (650 mg total) by mouth  every 6 (six) hours as needed for mild pain, fever or headache.   Alcohol Swabs (B-D SINGLE USE SWABS REGULAR) PADS USE TWICE DAILY   ALPRAZolam (XANAX) 0.25 MG tablet Take 0.25 mg by mouth daily as needed for anxiety.   blood glucose meter kit and supplies KIT Dispense based on patient and insurance preference. Use up to 2-3 times daily as directed. (FOR ICD-10 E11.65).   cetirizine (ZYRTEC) 10 MG tablet Take 10 mg by mouth at bedtime.   Cholecalciferol (VITAMIN D3) 1000 units CAPS Take 1,000 Units by mouth daily.   enalapril (VASOTEC) 2.5 MG tablet TAKE 1 TABLET ONE TIME EACH DAY   ferrous sulfate 325 (65 FE) MG tablet Take 1 tablet (325 mg total) by mouth 2 (two) times daily with a meal. (Patient taking differently: Take 325 mg by mouth daily with breakfast.)   fluticasone (FLONASE) 50 MCG/ACT nasal spray Place 1 spray into both nostrils daily as needed for allergies.   levothyroxine (SYNTHROID) 100 MCG tablet Take 100 mcg by mouth daily before breakfast.   lovastatin (MEVACOR) 40 MG tablet Take 40 mg by mouth at bedtime.    Omega-3 Fatty Acids (FISH OIL) 1000 MG CAPS Take 1,000 mg by mouth daily.   omeprazole (PRILOSEC) 40 MG capsule Take 40 mg daily by mouth.   pioglitazone (ACTOS) 15 MG tablet TAKE 1 TABLET (15 MG TOTAL) BY MOUTH DAILY.   polyethylene glycol (MIRALAX) 17 g packet Take 17 g by mouth 2 (two) times daily.   Polyethylene Glycol 400 (BLINK TEARS OP) Apply 1 drop to eye daily as needed (dry eye).   sitaGLIPtin (JANUVIA) 50 MG tablet Take 50 mg by mouth daily.   sodium chloride 1 g tablet Take 1 tablet (1 g total) by mouth daily.   vitamin B-12 (CYANOCOBALAMIN) 1000 MCG tablet Take 1,000 mcg by mouth daily.   No facility-administered encounter medications on file as of 01/06/2021.    ALLERGIES: Allergies  Allergen Reactions   Augmentin [Amoxicillin-Pot Clavulanate]     nervousness   Citalopram     Other reaction(s): drowsiness, decreased libido   Clonazepam     Other  reaction(s): drowsiness   Diazepam     Other reaction(s): drowsiness   Empagliflozin     Other reaction(s):  weak, tired   Escitalopram     Other reaction(s): decreased libido   Sertraline     Other reaction(s): drowsiness    VACCINATION STATUS: There is no immunization history for the selected administration types on file for this patient.  Diabetes She presents for her follow-up diabetic visit. She has type 2 diabetes mellitus. Onset time: She was diagnosed at approximate age of 68 years. Denies any history of gestational diabetes. Her disease course has been stable. There are no hypoglycemic associated symptoms. Pertinent negatives for hypoglycemia include no confusion, headaches, pallor or seizures. There are no diabetic associated symptoms. Pertinent negatives for diabetes include no chest pain, no fatigue, no polydipsia, no polyphagia and no polyuria. There are no hypoglycemic complications. Symptoms are stable. Diabetic complications include nephropathy and peripheral neuropathy. Risk factors for coronary artery disease include diabetes mellitus, dyslipidemia, family history, hypertension, obesity, sedentary lifestyle and post-menopausal. Current diabetic treatment includes oral agent (dual therapy). She is compliant with treatment most of the time. Her weight is fluctuating minimally. She is following a generally healthy diet. When asked about meal planning, she reported none. She has not had a previous visit with a dietitian. She never participates in exercise. Her home blood glucose trend is fluctuating minimally. (She presents today, accompanied by her caregiver, with no meter or logs to review.  She was not asked to routinely monitor glucose due to safe medication regimen of Januvia and Actos only.  Her POCT A1c today is 6.7%, improving from last visit of 7.7%.  She denies any s/s of hypoglycemia.) An ACE inhibitor/angiotensin II receptor blocker is not being taken. She does not see a  podiatrist.Eye exam is current.  Hyperlipidemia This is a chronic problem. The current episode started more than 1 year ago. The problem is controlled. Exacerbating diseases include chronic renal disease, diabetes, hypothyroidism and obesity. Factors aggravating her hyperlipidemia include fatty foods. Pertinent negatives include no chest pain, myalgias or shortness of breath. Current antihyperlipidemic treatment includes statins. The current treatment provides mild improvement of lipids. There are no compliance problems.  Risk factors for coronary artery disease include diabetes mellitus, dyslipidemia, family history, hypertension, obesity, a sedentary lifestyle and post-menopausal.  Hypertension This is a chronic problem. The current episode started more than 1 year ago. The problem has been gradually improving since onset. The problem is controlled. Pertinent negatives include no chest pain, headaches, palpitations or shortness of breath. Agents associated with hypertension include thyroid hormones. Risk factors for coronary artery disease include diabetes mellitus, dyslipidemia, obesity, sedentary lifestyle and post-menopausal state. Past treatments include ACE inhibitors. The current treatment provides moderate improvement. There are no compliance problems.  Hypertensive end-organ damage includes kidney disease. Identifiable causes of hypertension include chronic renal disease and a thyroid problem.   Review of systems  Constitutional: + stable body weight,  current Body mass index is 31.13 kg/m. , no fatigue, no subjective hyperthermia, no subjective hypothermia Eyes: no blurry vision, no xerophthalmia ENT: no sore throat, no nodules palpated in throat, no dysphagia/odynophagia, no hoarseness Cardiovascular: no chest pain, no shortness of breath, no palpitations, no leg swelling Respiratory: no cough, no shortness of breath Gastrointestinal: no nausea/vomiting/diarrhea Musculoskeletal: c/o  bilateral knee pain (arthritis)- uses cane Skin: no rashes, no hyperemia Neurological: no tremors, no numbness, no tingling, no dizziness Psychiatric: no depression, no anxiety  Objective:    BP (!) 179/67   Pulse 67   Ht _0  (1.575 m)   Wt 170 lb 3.2 oz (77.2 kg)   BMI  31.13 kg/m   Wt Readings from Last 3 Encounters:  01/06/21 170 lb 3.2 oz (77.2 kg)  08/06/20 169 lb 15.6 oz (77.1 kg)  01/04/20 172 lb (78 kg)    BP Readings from Last 3 Encounters:  01/06/21 (!) 179/67  08/08/20 (!) 130/46  01/04/20 (!) 140/54     Physical Exam- Limited  Constitutional:  Body mass index is 31.13 kg/m. , not in acute distress, normal state of mind Eyes:  EOMI, no exophthalmos Neck: Supple Cardiovascular: RRR, no murmurs, rubs, or gallops, no edema Respiratory: Adequate breathing efforts, no crackles, rales, rhonchi, or wheezing Musculoskeletal: no gross deformities, uses cane to ambulate Skin:  no rashes, no hyperemia Neurological: no tremor with outstretched hands    Recent Results (from the past 2160 hour(s))  COMPLETE METABOLIC PANEL WITH GFR     Status: Abnormal   Collection Time: 12/30/20 10:27 AM  Result Value Ref Range   Glucose, Bld 172 (H) 65 - 99 mg/dL    Comment: .            Fasting reference interval . For someone without known diabetes, a glucose value >125 mg/dL indicates that they may have diabetes and this should be confirmed with a follow-up test. .    BUN 19 7 - 25 mg/dL   Creat 1.02 (H) 0.60 - 1.00 mg/dL   eGFR 57 (L) > OR = 60 mL/min/1.97m    Comment: The eGFR is based on the CKD-EPI 2021 equation. To calculate  the new eGFR from a previous Creatinine or Cystatin C result, go to https://www.kidney.org/professionals/ kdoqi/gfr%5Fcalculator    BUN/Creatinine Ratio 19 6 - 22 (calc)   Sodium 133 (L) 135 - 146 mmol/L   Potassium 4.8 3.5 - 5.3 mmol/L   Chloride 98 98 - 110 mmol/L   CO2 26 20 - 32 mmol/L   Calcium 9.2 8.6 - 10.4 mg/dL   Total Protein  6.8 6.1 - 8.1 g/dL   Albumin 3.8 3.6 - 5.1 g/dL   Globulin 3.0 1.9 - 3.7 g/dL (calc)   AG Ratio 1.3 1.0 - 2.5 (calc)   Total Bilirubin 0.4 0.2 - 1.2 mg/dL   Alkaline phosphatase (APISO) 96 37 - 153 U/L   AST 15 10 - 35 U/L   ALT 7 6 - 29 U/L  TSH     Status: None   Collection Time: 12/30/20 10:27 AM  Result Value Ref Range   TSH 1.96 0.40 - 4.50 mIU/L  T4, free     Status: None   Collection Time: 12/30/20 10:27 AM  Result Value Ref Range   Free T4 1.4 0.8 - 1.8 ng/dL   Lipid Panel     Component Value Date/Time   CHOL 149 11/03/2017 1512   TRIG 86 11/03/2017 1512   HDL 53 11/03/2017 1512   CHOLHDL 2.8 11/03/2017 1512   LDLCALC 79 11/03/2017 1512      Assessment & Plan:   1) 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  She presents today, accompanied by her caregiver, with no meter or logs to review.  She was not asked to routinely monitor glucose due to safe medication regimen of Januvia and Actos only.  Her POCT A1c today is 6.7%, improving from last visit of 7.7%.  She denies any s/s of hypoglycemia.    -her diabetes is complicated  by stage 3 renal insufficiency which is improving, obesity/sedentary life and Madeline Fox remains at a high risk for more acute and chronic complications which include CAD, CVA, CKD, retinopathy, and neuropathy. These are all discussed in detail with the patient.  - Nutritional counseling repeated at each appointment due to patients tendency to fall back in to old habits.  - The patient admits there is a room for improvement in their diet and drink choices. -  Suggestion is made for the patient to avoid simple carbohydrates from their diet including Cakes, Sweet Desserts / Pastries, Ice Cream, Soda (diet and regular), Sweet Tea, Candies, Chips, Cookies, Sweet Pastries, Store Bought  Juices, Alcohol in Excess of 1-2 drinks a day, Artificial Sweeteners, Coffee Creamer, and "Sugar-free" Products. This will help patient to have stable blood glucose profile and potentially avoid unintended weight gain.   - I encouraged the patient to switch to unprocessed or minimally processed complex starch and increased protein intake (animal or plant source), fruits, and vegetables.   - Patient is advised to stick to a routine mealtimes to eat 3 meals a day and avoid unnecessary snacks (to snack only to correct hypoglycemia).  - I have approached her with the following individualized plan to manage diabetes and patient agrees:   - Based on her stable glycemic profile, she will not require any additional treatment.  She is advised to continue Januvia 50 mg p.o. daily at breakfast and Actos 15 mg p.o. daily at breakfast.  - Patient specific target  A1c;  LDL, HDL, Triglycerides, and  Waist Circumference were discussed in detail.  2) BP/HTN: Her blood pressure is not controlled to target.  She is advised to continue Enalapril 2.5 mg po daily.  3) Lipids/HPL:  She does not have recent lipid panel to review.  She is advised to continue Lovastatin 40 mg po daily at bedtime.  Side effects and precautions discussed with her.  4)  Weight/Diet:  Her Body mass index is 31.13 kg/m.-- not a candidate for major weight loss.  CDE Consult has been  initiated , exercise, and detailed carbohydrates information provided.  5) Postsurgical hypothyroidism - She underwent total thyroidectomy for large multinodular goiter in 2013.    -Her previsit TFTs are consistent with consistent with appropriate hormone replacement.  She is advised to continue Levothyroxine 100 mcg po daily before breakfast (increased months ago by her PCP).   - We discussed about the correct intake of her thyroid hormone, on empty stomach at fasting, with water, separated by at least 30 minutes from breakfast and other medications,  and  separated by more than 4 hours from calcium, iron, multivitamins, acid reflux medications (PPIs). -Patient is made aware of the fact that thyroid hormone replacement is needed for life, dose to be adjusted by periodic monitoring of thyroid function tests.  6) Chronic Care/Health Maintenance: -she is on ACE and on Statin medications and  is encouraged to continue to follow up with Ophthalmology, Dentist,  Podiatrist at least yearly or according to recommendations, and advised to stay away from smoking. I have recommended yearly flu vaccine and pneumonia vaccination at least every 5 years; moderate intensity exercise for up to 150 minutes weekly; and  sleep for at least 7 hours a day.  - I advised patient to maintain close follow up with Zhou-Talbert, Elwyn Lade, MD (Inactive) for primary care needs.    I spent 31 minutes in the care of the patient today including review of labs from CMP, Lipids,  Thyroid Function, Hematology (current and previous including abstractions from other facilities); face-to-face time discussing  her blood glucose readings/logs, discussing hypoglycemia and hyperglycemia episodes and symptoms, medications doses, her options of short and long term treatment based on the latest standards of care / guidelines;  discussion about incorporating lifestyle medicine;  and documenting the encounter.    Please refer to Patient Instructions for Blood Glucose Monitoring and Insulin/Medications Dosing Guide"  in media tab for additional information. Please  also refer to " Patient Self Inventory" in the Media  tab for reviewed elements of pertinent patient history.  Madeline Fox participated in the discussions, expressed understanding, and voiced agreement with the above plans.  All questions were answered to her satisfaction. she is encouraged to contact clinic should she have any questions or concerns prior to her return visit.    Follow up plan: - Return in about 1 year (around  01/06/2022) for Diabetes F/U- A1c and UM in office, Thyroid follow up, Previsit labs, Bring meter and logs.  Rayetta Pigg, [facility] [address] [place], [place] [address] Phone: [phone] Fax: [phone]  01/06/2021, 3:30 PM

## 2021-01-06 NOTE — Addendum Note (Signed)
Addended by: Anibal Henderson on: 01/06/2021 03:37 PM   Modules accepted: Orders

## 2021-01-06 NOTE — Patient Instructions (Signed)

## 2021-01-13 ENCOUNTER — Ambulatory Visit: Payer: Medicare HMO | Admitting: Orthopedic Surgery

## 2021-01-13 DIAGNOSIS — K219 Gastro-esophageal reflux disease without esophagitis: Secondary | ICD-10-CM | POA: Insufficient documentation

## 2021-01-13 DIAGNOSIS — R011 Cardiac murmur, unspecified: Secondary | ICD-10-CM | POA: Insufficient documentation

## 2021-01-13 DIAGNOSIS — E871 Hypo-osmolality and hyponatremia: Secondary | ICD-10-CM | POA: Insufficient documentation

## 2021-01-13 DIAGNOSIS — M25561 Pain in right knee: Secondary | ICD-10-CM | POA: Insufficient documentation

## 2021-01-13 DIAGNOSIS — D72810 Lymphocytopenia: Secondary | ICD-10-CM | POA: Insufficient documentation

## 2021-01-13 DIAGNOSIS — N189 Chronic kidney disease, unspecified: Secondary | ICD-10-CM | POA: Insufficient documentation

## 2021-01-13 DIAGNOSIS — E039 Hypothyroidism, unspecified: Secondary | ICD-10-CM | POA: Insufficient documentation

## 2021-01-13 DIAGNOSIS — H35039 Hypertensive retinopathy, unspecified eye: Secondary | ICD-10-CM | POA: Insufficient documentation

## 2021-01-13 DIAGNOSIS — J302 Other seasonal allergic rhinitis: Secondary | ICD-10-CM | POA: Insufficient documentation

## 2021-01-13 DIAGNOSIS — J329 Chronic sinusitis, unspecified: Secondary | ICD-10-CM | POA: Insufficient documentation

## 2021-01-13 DIAGNOSIS — N2581 Secondary hyperparathyroidism of renal origin: Secondary | ICD-10-CM | POA: Insufficient documentation

## 2021-01-13 DIAGNOSIS — E559 Vitamin D deficiency, unspecified: Secondary | ICD-10-CM | POA: Insufficient documentation

## 2021-01-13 DIAGNOSIS — I5030 Unspecified diastolic (congestive) heart failure: Secondary | ICD-10-CM | POA: Insufficient documentation

## 2021-01-13 DIAGNOSIS — K5900 Constipation, unspecified: Secondary | ICD-10-CM | POA: Insufficient documentation

## 2021-01-13 DIAGNOSIS — F411 Generalized anxiety disorder: Secondary | ICD-10-CM | POA: Insufficient documentation

## 2021-01-13 DIAGNOSIS — H25813 Combined forms of age-related cataract, bilateral: Secondary | ICD-10-CM | POA: Insufficient documentation

## 2021-02-06 ENCOUNTER — Ambulatory Visit (INDEPENDENT_AMBULATORY_CARE_PROVIDER_SITE_OTHER): Payer: Medicare HMO | Admitting: Orthopedic Surgery

## 2021-02-06 ENCOUNTER — Other Ambulatory Visit: Payer: Self-pay

## 2021-02-06 DIAGNOSIS — S72001D Fracture of unspecified part of neck of right femur, subsequent encounter for closed fracture with routine healing: Secondary | ICD-10-CM

## 2021-02-06 NOTE — Progress Notes (Signed)
FOLLOW UP   Encounter Diagnosis  Name Primary?   Closed fracture of right hip with routine healing, subsequent encounter July 12th 2022 IM nail  Yes     Chief Complaint  Patient presents with   Hip Injury    RT/ s/p surgery 08/06/20     Madeline Fox is 76 years old she had an IM nail in July she is doing well she takes her cane with her just in case but walks independently  I observed her gait it was very good  She has good hip motion without pain  I released her today

## 2021-02-24 ENCOUNTER — Other Ambulatory Visit: Payer: Self-pay | Admitting: Family Medicine

## 2021-02-24 DIAGNOSIS — Z1231 Encounter for screening mammogram for malignant neoplasm of breast: Secondary | ICD-10-CM

## 2021-03-13 ENCOUNTER — Other Ambulatory Visit: Payer: Self-pay | Admitting: Nurse Practitioner

## 2021-04-15 ENCOUNTER — Telehealth: Payer: Self-pay | Admitting: Nurse Practitioner

## 2021-04-15 NOTE — Telephone Encounter (Signed)
Patient said that Mcarthur Rossetti was suppose to have faxed over for a new meter. Can you send in for Accu Chek, she needs the needles and strips. Please send to [facility] ?

## 2021-04-16 ENCOUNTER — Other Ambulatory Visit: Payer: Self-pay

## 2021-04-16 MED ORDER — ACCU-CHEK GUIDE W/DEVICE KIT: PACK | 0 refills | Status: DC

## 2021-04-16 MED ORDER — LANCETS MISC: 2 refills | Status: DC

## 2021-04-16 MED ORDER — GLUCOSE BLOOD VI STRP: 1.0000 | ORAL_STRIP | Freq: Three times a day (TID) | 2 refills | Status: DC

## 2021-04-17 NOTE — Telephone Encounter (Signed)
Looks like the requested supplies were sent to the pharmacy requested yesterday. 04/16/2021 ?

## 2021-04-18 ENCOUNTER — Other Ambulatory Visit: Payer: Self-pay

## 2021-04-18 DIAGNOSIS — N1831 Chronic kidney disease, stage 3a: Secondary | ICD-10-CM

## 2021-04-18 MED ORDER — BD SWAB SINGLE USE REGULAR PADS: MEDICATED_PAD | 2 refills | Status: AC

## 2021-04-25 ENCOUNTER — Ambulatory Visit
Admission: RE | Admit: 2021-04-25 | Discharge: 2021-04-25 | Disposition: A | Discharge status: Home / Self Care | Payer: Medicare HMO | Source: Ambulatory Visit | Attending: Family Medicine | Admitting: Family Medicine

## 2021-04-25 DIAGNOSIS — Z1231 Encounter for screening mammogram for malignant neoplasm of breast: Secondary | ICD-10-CM | POA: Diagnosis not present

## 2021-05-31 ENCOUNTER — Other Ambulatory Visit: Payer: Self-pay | Admitting: "Endocrinology

## 2021-09-01 ENCOUNTER — Other Ambulatory Visit (HOSPITAL_COMMUNITY): Payer: Self-pay | Admitting: Nephrology

## 2021-09-01 ENCOUNTER — Other Ambulatory Visit: Payer: Self-pay | Admitting: Nephrology

## 2021-09-01 DIAGNOSIS — E1122 Type 2 diabetes mellitus with diabetic chronic kidney disease: Secondary | ICD-10-CM

## 2021-09-01 DIAGNOSIS — I129 Hypertensive chronic kidney disease with stage 1 through stage 4 chronic kidney disease, or unspecified chronic kidney disease: Secondary | ICD-10-CM

## 2021-09-01 DIAGNOSIS — R809 Proteinuria, unspecified: Secondary | ICD-10-CM

## 2021-09-01 DIAGNOSIS — D631 Anemia in chronic kidney disease: Secondary | ICD-10-CM

## 2021-09-11 ENCOUNTER — Ambulatory Visit (HOSPITAL_COMMUNITY)
Admission: RE | Admit: 2021-09-11 | Discharge: 2021-09-11 | Disposition: A | Discharge status: Home / Self Care | Payer: Medicare HMO | Source: Ambulatory Visit | Attending: Nephrology | Admitting: Nephrology

## 2021-09-11 DIAGNOSIS — N189 Chronic kidney disease, unspecified: Secondary | ICD-10-CM | POA: Insufficient documentation

## 2021-09-11 DIAGNOSIS — R809 Proteinuria, unspecified: Secondary | ICD-10-CM | POA: Insufficient documentation

## 2021-09-11 DIAGNOSIS — D631 Anemia in chronic kidney disease: Secondary | ICD-10-CM | POA: Diagnosis present

## 2021-09-11 DIAGNOSIS — I129 Hypertensive chronic kidney disease with stage 1 through stage 4 chronic kidney disease, or unspecified chronic kidney disease: Secondary | ICD-10-CM | POA: Diagnosis present

## 2021-09-11 DIAGNOSIS — E1129 Type 2 diabetes mellitus with other diabetic kidney complication: Secondary | ICD-10-CM | POA: Insufficient documentation

## 2021-09-11 DIAGNOSIS — E1122 Type 2 diabetes mellitus with diabetic chronic kidney disease: Secondary | ICD-10-CM | POA: Diagnosis not present

## 2021-09-22 ENCOUNTER — Other Ambulatory Visit: Payer: Self-pay | Admitting: Unknown Physician Specialty

## 2021-09-22 DIAGNOSIS — J329 Chronic sinusitis, unspecified: Secondary | ICD-10-CM

## 2021-09-30 ENCOUNTER — Ambulatory Visit
Admission: RE | Admit: 2021-09-30 | Discharge: 2021-09-30 | Disposition: A | Discharge status: Home / Self Care | Payer: Medicare HMO | Source: Ambulatory Visit | Attending: Unknown Physician Specialty | Admitting: Unknown Physician Specialty

## 2021-09-30 DIAGNOSIS — J329 Chronic sinusitis, unspecified: Secondary | ICD-10-CM

## 2021-10-27 ENCOUNTER — Telehealth: Payer: Self-pay | Admitting: Nurse Practitioner

## 2021-10-27 NOTE — Telephone Encounter (Signed)
I called [employer]. Madeline Fox is in the doughnut hole, and will be for the next three months. This medication and all the others that are in this family are all brand name. The company does not have anything that will hep her with her insurance. They suggested that we call [employer] help and see if they may be able to offer assistance.

## 2021-10-27 NOTE — Telephone Encounter (Signed)
New message    Pt c/o medication issue:  1. Name of Medication: sitaGLIPtin (JANUVIA) 50 MG tablet  2. How are you currently taking this medication (dosage and times per day)?once a day    3. Are you having a reaction (difficulty breathing--STAT)? No   4. What is your medication issue? The cost payment $ 150.00 -looking for cheaper price   5. [facility] [employer] in [place].

## 2021-10-27 NOTE — Telephone Encounter (Signed)
Yes, looks like we need to look into patient assistance programs to help cover the cost of this medication since she is in the donut hole.  I have printed out the form, the patient will need to come pick up and fill out her portion, along with attaching any required financial documents.  Once she brings that back (filled out), we can fill out our portion and send it off.

## 2021-10-28 NOTE — Telephone Encounter (Signed)
I have talked with the patient. She ask that we mail her part, application she will complete and mail back to [place] or have her daughter to bring it to the office.

## 2021-12-02 ENCOUNTER — Telehealth: Payer: Self-pay | Admitting: Nurse Practitioner

## 2021-12-02 NOTE — Telephone Encounter (Signed)
New message   The patient called sitaGLIPtin (JANUVIA) 50 MG tablet  [place] sent her application that needs to be updated.   The patient mailed the letter to the office for completed .

## 2021-12-04 NOTE — Telephone Encounter (Signed)
Talked with the patient. She mailed the application on yesterday to our office. We may get it on Monday. Once our part is completed we will fax it all in for the patient.

## 2021-12-08 NOTE — Telephone Encounter (Signed)
Madeline Fox, I just got this out of the mailbox & I have placed it in your box

## 2021-12-09 NOTE — Telephone Encounter (Signed)
I have completed our part, will get Whitney's signature and fax it in for the patient.

## 2022-01-07 ENCOUNTER — Ambulatory Visit: Payer: Medicare HMO | Admitting: Nurse Practitioner

## 2022-01-29 ENCOUNTER — Ambulatory Visit: Payer: Medicare HMO | Admitting: Nurse Practitioner

## 2022-03-09 ENCOUNTER — Other Ambulatory Visit: Payer: Self-pay | Admitting: Family Medicine

## 2022-03-09 DIAGNOSIS — Z1231 Encounter for screening mammogram for malignant neoplasm of breast: Secondary | ICD-10-CM

## 2022-03-31 ENCOUNTER — Telehealth: Payer: Self-pay | Admitting: Nurse Practitioner

## 2022-03-31 DIAGNOSIS — E1122 Type 2 diabetes mellitus with diabetic chronic kidney disease: Secondary | ICD-10-CM

## 2022-03-31 DIAGNOSIS — E890 Postprocedural hypothyroidism: Secondary | ICD-10-CM

## 2022-03-31 NOTE — Telephone Encounter (Signed)
New order in chart 

## 2022-03-31 NOTE — Telephone Encounter (Signed)
Pt needs labs updated

## 2022-04-02 ENCOUNTER — Telehealth: Payer: Self-pay | Admitting: "Endocrinology

## 2022-04-02 DIAGNOSIS — E890 Postprocedural hypothyroidism: Secondary | ICD-10-CM

## 2022-04-02 DIAGNOSIS — N1831 Chronic kidney disease, stage 3a: Secondary | ICD-10-CM

## 2022-04-02 NOTE — Telephone Encounter (Signed)
Can you change her lab order to [facility] and let me know

## 2022-04-02 NOTE — Telephone Encounter (Signed)
New order in chart 

## 2022-04-03 LAB — COMPREHENSIVE METABOLIC PANEL
AG Ratio: 1.3 (calc) (ref 1.0–2.5)
ALT: 8 U/L (ref 6–29)
AST: 14 U/L (ref 10–35)
Albumin: 3.9 g/dL (ref 3.6–5.1)
Alkaline phosphatase (APISO): 85 U/L (ref 37–153)
BUN/Creatinine Ratio: 21 (calc) (ref 6–22)
BUN: 22 mg/dL (ref 7–25)
CO2: 28 mmol/L (ref 20–32)
Calcium: 9 mg/dL (ref 8.6–10.4)
Chloride: 96 mmol/L — ABNORMAL LOW (ref 98–110)
Creat: 1.07 mg/dL — ABNORMAL HIGH (ref 0.60–1.00)
Globulin: 2.9 g/dL (calc) (ref 1.9–3.7)
Glucose, Bld: 159 mg/dL — ABNORMAL HIGH (ref 65–99)
Potassium: 5.1 mmol/L (ref 3.5–5.3)
Sodium: 132 mmol/L — ABNORMAL LOW (ref 135–146)
Total Bilirubin: 0.4 mg/dL (ref 0.2–1.2)
Total Protein: 6.8 g/dL (ref 6.1–8.1)

## 2022-04-03 LAB — TSH: TSH: 1.38 mIU/L (ref 0.40–4.50)

## 2022-04-03 LAB — T4, FREE: Free T4: 1.4 ng/dL (ref 0.8–1.8)

## 2022-04-08 ENCOUNTER — Ambulatory Visit: Payer: Medicare HMO | Admitting: Nurse Practitioner

## 2022-04-08 ENCOUNTER — Encounter: Payer: Self-pay | Admitting: Nurse Practitioner

## 2022-04-08 VITALS — BP 142/68 | HR 68 | Ht 63.0 in | Wt 170.2 lb

## 2022-04-08 DIAGNOSIS — I10 Essential (primary) hypertension: Secondary | ICD-10-CM | POA: Diagnosis not present

## 2022-04-08 DIAGNOSIS — E1122 Type 2 diabetes mellitus with diabetic chronic kidney disease: Secondary | ICD-10-CM

## 2022-04-08 DIAGNOSIS — E890 Postprocedural hypothyroidism: Secondary | ICD-10-CM | POA: Diagnosis not present

## 2022-04-08 DIAGNOSIS — E782 Mixed hyperlipidemia: Secondary | ICD-10-CM

## 2022-04-08 DIAGNOSIS — N1831 Chronic kidney disease, stage 3a: Secondary | ICD-10-CM

## 2022-04-08 LAB — POCT GLYCOSYLATED HEMOGLOBIN (HGB A1C): Hemoglobin A1C: 7.6 % — AB (ref 4.0–5.6)

## 2022-04-08 MED ORDER — TRIAMCINOLONE ACETONIDE 0.025 % EX CREA: 1.0000 | TOPICAL_CREAM | Freq: Two times a day (BID) | CUTANEOUS | 0 refills | Status: AC

## 2022-04-08 NOTE — Progress Notes (Signed)
04/08/2022, 4:11 PM                            Endocrinology Follow Up Visit  Subjective:    Patient ID: Madeline Fox, female    DOB: 1945/03/22.  Shondi Leonelli is being engaged in follow-up  for management of currently controlled symptomatic type 2 diabetes, postsurgical hypothyroidism, hyperlipidemia. PMD:   Coolidge Breeze, FNP.   Past Medical History:  Diagnosis Date   Anemia    iron deficiency anemia   Anxiety    Arthritis    Breast cancer ([place]) 2007   LT LUMPECTOMY   Cancer ([place]) 2007   L BREAST CANCER - CHEMO/RADIATION /LUMPECTOMY     Chronic kidney disease    stage 3   Complication of anesthesia    Diabetes mellitus without complication (HCC)    Type 2   Diverticulosis of colon with hemorrhage    Goiter    Heart murmur    Hyperlipidemia    Hypertension    Hyperthyroidism    Hypothyroidism    s/p thyroid ablation   Internal hemorrhoids    Neuromuscular disorder ([place])    NEUROPATHY IN TOES   Nocturia    Ovarian carcinoma in situ    Personal history of chemotherapy    Personal history of radiation therapy    Pneumonia    Psoriasis    S/P chemotherapy, time since greater than 12 weeks 2007   BREAST CA   S/P radiation > 12 weeks 2007   BREAST CA   Thrombocytopenia ([place])    Wears dentures    partial upper   Past Surgical History:  Procedure Laterality Date   ABDOMINAL HYSTERECTOMY     APPENDECTOMY     BREAST BIOPSY Left 2007   POS   BREAST LUMPECTOMY     CATARACT EXTRACTION W/PHACO Right 09/29/2017   Procedure: CATARACT EXTRACTION PHACO AND INTRAOCULAR LENS PLACEMENT ([place])  RIGHT DIABETIC;  Surgeon: Leandrew Koyanagi, MD;  Location: [facility];  Service: Ophthalmology;  Laterality: Right;  Diabetic - oral meds   CATARACT EXTRACTION W/PHACO Left 02/09/2018   Procedure: CATARACT EXTRACTION PHACO AND INTRAOCULAR LENS PLACEMENT ([place]) LEFT DIABETIC;  Surgeon:  Leandrew Koyanagi, MD;  Location: [facility];  Service: Ophthalmology;  Laterality: Left;   CHOLECYSTECTOMY     COLONOSCOPY WITH PROPOFOL N/A 04/12/2015   Procedure: COLONOSCOPY WITH PROPOFOL;  Surgeon: Hulen Luster, MD;  Location: [facility] END[facility]COPY;  Service: Gastroenterology;  Laterality: N/A;   FINE NEEDLE ASPIRATION Left    left upper lobe thyroid - benign   INTRAMEDULLARY (IM) NAIL INTERTROCHANTERIC Right 08/06/2020   Procedure: INTRAMEDULLARY (IM) NAIL INTERTROCHANTRIC;  Surgeon: Carole Civil, MD;  Location: [facility];  Service: Orthopedics;  Laterality: Right;   MASTECTOMY PARTIAL / LUMPECTOMY Left 2007   chemo and radiation   OOPHORECTOMY     for carcinoma in situ   THYROIDECTOMY  11/19/2011   Procedure: THYROIDECTOMY;  Surgeon: Earnstine Regal, MD;  Location: [facility];  Service: General;  Laterality: N/A;  Total Thyroidectomy   THYROIDECTOMY  Social History   Socioeconomic History   Marital status: Married    Spouse name: Not on file   Number of children: Not on file   Years of education: Not on file   Highest education level: Not on file  Occupational History   Not on file  Tobacco Use   Smoking status: Never   Smokeless tobacco: Never  Vaping Use   Vaping Use: Never used  Substance and Sexual Activity   Alcohol use: No   Drug use: No   Sexual activity: Not on file  Other Topics Concern   Not on file  Social History Narrative   Not on file   Social Determinants of Health   Financial Resource Strain: Not on file  Food Insecurity: Not on file  Transportation Needs: Not on file  Physical Activity: Not on file  Stress: Not on file  Social Connections: Not on file   Outpatient Encounter Medications as of 04/08/2022  Medication Sig   ACCU-CHEK AVIVA PLUS test strip USE UP TO 2 TO 3 TIMES DAILY AS DIRECTED   acetaminophen (TYLENOL) 325 MG tablet Take 2 tablets (650 mg total) by mouth every 6 (six) hours as needed for mild pain, fever or headache.    Alcohol Swabs (B-D SINGLE USE SWABS REGULAR) PADS USE TWICE DAILY   ALPRAZolam (XANAX) 0.25 MG tablet Take 0.25 mg by mouth daily as needed for anxiety.   Blood Glucose Monitoring Suppl (ACCU-CHEK GUIDE) w/Device KIT Test BG tid. E11.65   cetirizine (ZYRTEC) 10 MG tablet Take 10 mg by mouth at bedtime.   Cholecalciferol (VITAMIN D3) 1000 units CAPS Take 1,000 Units by mouth daily.   enalapril (VASOTEC) 2.5 MG tablet TAKE 1 TABLET ONE TIME EACH DAY   ferrous sulfate 325 (65 FE) MG tablet Take 1 tablet (325 mg total) by mouth 2 (two) times daily with a meal. (Patient taking differently: Take 325 mg by mouth daily with breakfast.)   fluticasone (FLONASE) 50 MCG/ACT nasal spray Place 1 spray into both nostrils daily as needed for allergies.   Lancets MISC Test BG tid. Accu-chek guide. E11.65   levothyroxine (SYNTHROID) 100 MCG tablet Take 100 mcg by mouth daily before breakfast.   lovastatin (MEVACOR) 40 MG tablet Take 40 mg by mouth at bedtime.    Omega-3 Fatty Acids (FISH OIL) 1000 MG CAPS Take 1,000 mg by mouth daily.   omeprazole (PRILOSEC) 40 MG capsule Take 40 mg daily by mouth.   pioglitazone (ACTOS) 15 MG tablet TAKE 1 TABLET (15 MG TOTAL) BY MOUTH DAILY.   polyethylene glycol (MIRALAX) 17 g packet Take 17 g by mouth 2 (two) times daily.   Polyethylene Glycol 400 (BLINK TEARS OP) Apply 1 drop to eye daily as needed (dry eye).   sitaGLIPtin (JANUVIA) 50 MG tablet Take 50 mg by mouth daily.   triamcinolone (KENALOG) 0.025 % cream Apply 1 Application topically 2 (two) times daily.   vitamin B-12 (CYANOCOBALAMIN) 1000 MCG tablet Take 1,000 mcg by mouth daily.   sodium chloride 1 g tablet Take 1 tablet (1 g total) by mouth daily. (Patient not taking: Reported on 04/08/2022)   No facility-administered encounter medications on file as of 04/08/2022.    ALLERGIES: Allergies  Allergen Reactions   Amoxicillin-Pot Clavulanate Other (See Comments)    nervousness   Citalopram     Other  reaction(s): drowsiness, decreased libido   Citalopram Hydrobromide Other (See Comments)   Clonazepam     Other reaction(s): drowsiness   Diazepam  Other reaction(s): drowsiness   Empagliflozin     Other reaction(s): weak, tired   Escitalopram     Other reaction(s): decreased libido   Sertraline     Other reaction(s): drowsiness   Sertraline Hcl Other (See Comments)    VACCINATION STATUS: There is no immunization history for the selected administration types on file for this patient.  Diabetes She presents for her follow-up diabetic visit. She has type 2 diabetes mellitus. Onset time: She was diagnosed at approximate age of 65 years. Denies any history of gestational diabetes. Her disease course has been worsening. There are no hypoglycemic associated symptoms. Pertinent negatives for hypoglycemia include no confusion, headaches, pallor or seizures. There are no diabetic associated symptoms. Pertinent negatives for diabetes include no chest pain, no fatigue, no polydipsia, no polyphagia and no polyuria. There are no hypoglycemic complications. Symptoms are stable. Diabetic complications include nephropathy and peripheral neuropathy. Risk factors for coronary artery disease include diabetes mellitus, dyslipidemia, family history, hypertension, obesity, sedentary lifestyle and post-menopausal. Current diabetic treatment includes oral agent (dual therapy). She is compliant with treatment most of the time. Her weight is fluctuating minimally. She is following a generally healthy diet. When asked about meal planning, she reported none. She has not had a previous visit with a dietitian. She never participates in exercise. Her home blood glucose trend is fluctuating minimally. Her breakfast blood glucose range is generally 180-200 mg/dl. Her bedtime blood glucose range is generally >200 mg/dl. (She presents today, accompanied by her daughter, with no meter or logs to review.  She reports morning  readings around 190 and evening readings around 210.  Her POCT A1c today is 7.6%, increasing from last visit here of 6.7%.  She notes she has been more stressed lately, caring for her husband who had a stroke.  She also notes some infections and steroid injections in recent months.  She notes she has not been as active during the colder months.) An ACE inhibitor/angiotensin II receptor blocker is not being taken. She does not see a podiatrist.Eye exam is current.  Hyperlipidemia This is a chronic problem. The current episode started more than 1 year ago. The problem is controlled. Exacerbating diseases include chronic renal disease, diabetes, hypothyroidism and obesity. Factors aggravating her hyperlipidemia include fatty foods. Pertinent negatives include no chest pain, myalgias or shortness of breath. Current antihyperlipidemic treatment includes statins. The current treatment provides mild improvement of lipids. There are no compliance problems.  Risk factors for coronary artery disease include diabetes mellitus, dyslipidemia, family history, hypertension, obesity, a sedentary lifestyle and post-menopausal.  Hypertension This is a chronic problem. The current episode started more than 1 year ago. The problem has been gradually improving since onset. The problem is controlled. Pertinent negatives include no chest pain, headaches, palpitations or shortness of breath. Agents associated with hypertension include thyroid hormones. Risk factors for coronary artery disease include diabetes mellitus, dyslipidemia, obesity, sedentary lifestyle and post-menopausal state. Past treatments include ACE inhibitors. The current treatment provides moderate improvement. There are no compliance problems.  Hypertensive end-organ damage includes kidney disease. Identifiable causes of hypertension include chronic renal disease and a thyroid problem.    Review of systems  Constitutional: + stable body weight,  current Body  mass index is 30.15 kg/m. , no fatigue, no subjective hyperthermia, no subjective hypothermia Eyes: no blurry vision, no xerophthalmia ENT: no sore throat, no nodules palpated in throat, no dysphagia/odynophagia, no hoarseness Cardiovascular: no chest pain, no shortness of breath, no palpitations, no leg swelling Respiratory:  no cough, no shortness of breath Gastrointestinal: no nausea/vomiting/diarrhea Musculoskeletal: c/o bilateral knee pain (arthritis)- uses cane Skin: no rashes, no hyperemia Neurological: no tremors, no numbness, no tingling, no dizziness Psychiatric: no depression, no anxiety, + increased stress from caring for husband  Objective:    BP (!) 142/68 (BP Location: Left Arm, Patient Position: Sitting, Cuff Size: Large) Comment: Retake Manuel Cuff  Pulse 68   Ht '5\' 3"'$  (1.6 m)   Wt 170 lb 3.2 oz (77.2 kg)   BMI 30.15 kg/m   Wt Readings from Last 3 Encounters:  04/08/22 170 lb 3.2 oz (77.2 kg)  01/06/21 170 lb 3.2 oz (77.2 kg)  08/06/20 169 lb 15.6 oz (77.1 kg)    BP Readings from Last 3 Encounters:  04/08/22 (!) 142/68  01/06/21 (!) 179/67  08/08/20 (!) 130/46     Physical Exam- Limited  Constitutional:  Body mass index is 30.15 kg/m. , not in acute distress, normal state of mind Respiratory: Adequate breathing efforts, no crackles, rales, rhonchi, or wheezing Musculoskeletal: no gross deformities, uses cane to ambulate Skin:  no rashes, no hyperemia, circular, raised, itchy spot to left anterior foot ? Fungal infection Neurological: no tremor with outstretched hands   Diabetic Foot Exam - Simple   Simple Foot Form Diabetic Foot exam was performed with the following findings: Yes 04/08/2022  4:07 PM  Visual Inspection No deformities, no ulcerations, no other skin breakdown bilaterally: Yes Sensation Testing Intact to touch and monofilament testing bilaterally: Yes Pulse Check Posterior Tibialis and Dorsalis pulse intact bilaterally: Yes Comments      Recent Results (from the past 2160 hour(s))  TSH     Status: None   Collection Time: 04/02/22  2:57 PM  Result Value Ref Range   TSH 1.38 0.40 - 4.50 mIU/L  T4, Free     Status: None   Collection Time: 04/02/22  2:57 PM  Result Value Ref Range   Free T4 1.4 0.8 - 1.8 ng/dL  Comprehensive metabolic panel     Status: Abnormal   Collection Time: 04/02/22  2:57 PM  Result Value Ref Range   Glucose, Bld 159 (H) 65 - 99 mg/dL    Comment: .            Fasting reference interval . For someone without known diabetes, a glucose value >125 mg/dL indicates that they may have diabetes and this should be confirmed with a follow-up test. .    BUN 22 7 - 25 mg/dL   Creat 1.07 (H) 0.60 - 1.00 mg/dL   BUN/Creatinine Ratio 21 6 - 22 (calc)   Sodium 132 (L) 135 - 146 mmol/L   Potassium 5.1 3.5 - 5.3 mmol/L   Chloride 96 (L) 98 - 110 mmol/L   CO2 28 20 - 32 mmol/L   Calcium 9.0 8.6 - 10.4 mg/dL   Total Protein 6.8 6.1 - 8.1 g/dL   Albumin 3.9 3.6 - 5.1 g/dL   Globulin 2.9 1.9 - 3.7 g/dL (calc)   AG Ratio 1.3 1.0 - 2.5 (calc)   Total Bilirubin 0.4 0.2 - 1.2 mg/dL   Alkaline phosphatase (APISO) 85 37 - 153 U/L   AST 14 10 - 35 U/L   ALT 8 6 - 29 U/L  HgB A1c     Status: Abnormal   Collection Time: 04/08/22  3:40 PM  Result Value Ref Range   Hemoglobin A1C 7.6 (A) 4.0 - 5.6 %   HbA1c POC (<> result, manual entry)  HbA1c, POC (prediabetic range)     HbA1c, POC (controlled diabetic range)     Lipid Panel     Component Value Date/Time   CHOL 149 11/03/2017 1512   TRIG 86 11/03/2017 1512   HDL 53 11/03/2017 1512   CHOLHDL 2.8 11/03/2017 1512   LDLCALC 79 11/03/2017 1512     Latest Reference Range & Units 10/03/19 08:42 12/28/19 10:02 02/27/20 11:10 12/30/20 10:27 04/02/22 14:57  TSH 0.40 - 4.50 mIU/L 0.84 5.22 (H) 3.12 1.96 1.38  T4,Free(Direct) 0.8 - 1.8 ng/dL 1.6 1.2 1.3 1.4 1.4  (H): Data is abnormally high  Assessment & Plan:   1) Type 2 diabetes mellitus with stage  2-3 chronic kidney disease, without long-term current use of insulin (HCC)  - Autym Louk has currently uncontrolled symptomatic type 2 DM since  77 years of age.  She presents today, accompanied by her daughter, with no meter or logs to review.  She reports morning readings around 190 and evening readings around 210.  Her POCT A1c today is 7.6%, increasing from last visit here of 6.7%.  She notes she has been more stressed lately, caring for her husband who had a stroke.  She also notes some infections and steroid injections in recent months.  She notes she has not been as active during the colder months.    -her diabetes is complicated by stage 3 renal insufficiency which is improving, obesity/sedentary life and Berkley Rohlik remains at a high risk for more acute and chronic complications which include CAD, CVA, CKD, retinopathy, and neuropathy. These are all discussed in detail with the patient.  - Nutritional counseling repeated at each appointment due to patients tendency to fall back in to old habits.  - The patient admits there is a room for improvement in their diet and drink choices. -  Suggestion is made for the patient to avoid simple carbohydrates from their diet including Cakes, Sweet Desserts / Pastries, Ice Cream, Soda (diet and regular), Sweet Tea, Candies, Chips, Cookies, Sweet Pastries, Store Bought Juices, Alcohol in Excess of 1-2 drinks a day, Artificial Sweeteners, Coffee Creamer, and "Sugar-free" Products. This will help patient to have stable blood glucose profile and potentially avoid unintended weight gain.   - I encouraged the patient to switch to unprocessed or minimally processed complex starch and increased protein intake (animal or plant source), fruits, and vegetables.   - Patient is advised to stick to a routine mealtimes to eat 3 meals a day and avoid unnecessary snacks (to snack only to correct hypoglycemia).  - I have approached her with the following  individualized plan to manage diabetes and patient agrees:   -she will not require any additional treatment.  She is advised to continue Januvia 50 mg p.o. daily at breakfast and Actos 15 mg p.o. daily at breakfast.  Instead, she is advised to continue her current healthy diet regimen and incorporate more physical exercise into her regimen.  - Patient specific target  A1c;  LDL, HDL, Triglycerides, and  Waist Circumference were discussed in detail.  2) BP/HTN: Her blood pressure is not controlled to target.  She is advised to continue Enalapril 2.5 mg po daily.  3) Lipids/HPL:  She does not have recent lipid panel to review.  She is advised to continue Lovastatin 40 mg po daily at bedtime.  Side effects and precautions discussed with her.  4)  Weight/Diet:  Her Body mass index is 30.15 kg/m.-- not a candidate for major weight loss.  CDE  Consult has been  initiated , exercise, and detailed carbohydrates information provided.  5) Postsurgical hypothyroidism - She underwent total thyroidectomy for large multinodular goiter in 2013.    -Her previsit TFTs are consistent with consistent with appropriate hormone replacement.  She is advised to continue Levothyroxine 100 mcg po daily before breakfast.   - We discussed about the correct intake of her thyroid hormone, on empty stomach at fasting, with water, separated by at least 30 minutes from breakfast and other medications,  and separated by more than 4 hours from calcium, iron, multivitamins, acid reflux medications (PPIs). -Patient is made aware of the fact that thyroid hormone replacement is needed for life, dose to be adjusted by periodic monitoring of thyroid function tests.  6) Chronic Care/Health Maintenance: -she is on ACE and on Statin medications and  is encouraged to continue to follow up with Ophthalmology, Dentist,  Podiatrist at least yearly or according to recommendations, and advised to stay away from smoking. I have recommended  yearly flu vaccine and pneumonia vaccination at least every 5 years; moderate intensity exercise for up to 150 minutes weekly; and  sleep for at least 7 hours a day.  - I advised patient to maintain close follow up with Coolidge Breeze, FNP for primary care needs.     I spent  45  minutes in the care of the patient today including review of labs from [place], Lipids, Thyroid Function, Hematology (current and previous including abstractions from other facilities); face-to-face time discussing  her blood glucose readings/logs, discussing hypoglycemia and hyperglycemia episodes and symptoms, medications doses, her options of short and long term treatment based on the latest standards of care / guidelines;  discussion about incorporating lifestyle medicine;  and documenting the encounter. Risk reduction counseling performed per USPSTF guidelines to reduce obesity and cardiovascular risk factors.     Please refer to Patient Instructions for Blood Glucose Monitoring and Insulin/Medications Dosing Guide"  in media tab for additional information. Please  also refer to " Patient Self Inventory" in the Media  tab for reviewed elements of pertinent patient history.  Deborah Chalk participated in the discussions, expressed understanding, and voiced agreement with the above plans.  All questions were answered to her satisfaction. she is encouraged to contact clinic should she have any questions or concerns prior to her return visit.    Follow up plan: - Return in about 3 months (around 07/09/2022) for Diabetes F/U with A1c in office, No previsit labs.  Rayetta Pigg, [facility] [address] [place], [place] [address] Phone: [phone] Fax: [phone]  04/08/2022, 4:11 PM

## 2022-04-08 NOTE — Patient Instructions (Signed)

## 2022-04-24 ENCOUNTER — Other Ambulatory Visit: Payer: Self-pay | Admitting: Nurse Practitioner

## 2022-05-19 ENCOUNTER — Ambulatory Visit
Admission: RE | Admit: 2022-05-19 | Discharge: 2022-05-19 | Disposition: A | Discharge status: Home / Self Care | Payer: Medicare HMO | Source: Ambulatory Visit | Attending: Family Medicine | Admitting: Family Medicine

## 2022-05-19 DIAGNOSIS — Z1231 Encounter for screening mammogram for malignant neoplasm of breast: Secondary | ICD-10-CM | POA: Insufficient documentation

## 2022-07-01 ENCOUNTER — Other Ambulatory Visit: Payer: Self-pay | Admitting: Nurse Practitioner

## 2022-07-16 ENCOUNTER — Ambulatory Visit: Payer: Medicare HMO | Admitting: Nurse Practitioner

## 2022-07-16 DIAGNOSIS — E890 Postprocedural hypothyroidism: Secondary | ICD-10-CM

## 2022-07-16 DIAGNOSIS — N1831 Chronic kidney disease, stage 3a: Secondary | ICD-10-CM

## 2022-07-16 DIAGNOSIS — E782 Mixed hyperlipidemia: Secondary | ICD-10-CM

## 2022-07-16 DIAGNOSIS — I10 Essential (primary) hypertension: Secondary | ICD-10-CM

## 2022-07-16 DIAGNOSIS — Z7984 Long term (current) use of oral hypoglycemic drugs: Secondary | ICD-10-CM

## 2022-11-04 ENCOUNTER — Encounter: Payer: Self-pay | Admitting: Nurse Practitioner

## 2022-11-04 ENCOUNTER — Ambulatory Visit: Payer: Medicare HMO | Admitting: Nurse Practitioner

## 2022-11-04 VITALS — BP 130/62 | HR 64 | Ht 63.0 in | Wt 172.8 lb

## 2022-11-04 DIAGNOSIS — E782 Mixed hyperlipidemia: Secondary | ICD-10-CM

## 2022-11-04 DIAGNOSIS — E890 Postprocedural hypothyroidism: Secondary | ICD-10-CM | POA: Diagnosis not present

## 2022-11-04 DIAGNOSIS — E1122 Type 2 diabetes mellitus with diabetic chronic kidney disease: Secondary | ICD-10-CM

## 2022-11-04 DIAGNOSIS — Z7984 Long term (current) use of oral hypoglycemic drugs: Secondary | ICD-10-CM | POA: Diagnosis not present

## 2022-11-04 DIAGNOSIS — I10 Essential (primary) hypertension: Secondary | ICD-10-CM | POA: Diagnosis not present

## 2022-11-04 DIAGNOSIS — N1831 Chronic kidney disease, stage 3a: Secondary | ICD-10-CM | POA: Diagnosis not present

## 2022-11-04 LAB — POCT GLYCOSYLATED HEMOGLOBIN (HGB A1C): Hemoglobin A1C: 7.4 % — AB (ref 4.0–5.6)

## 2022-11-04 NOTE — Progress Notes (Signed)
11/04/2022, 10:35 AM                            Endocrinology Follow Up Visit  Subjective:    Patient ID: Madeline Fox, female    DOB: Jul 08, 1945.  Madeline Fox is being engaged in follow-up  for management of currently controlled symptomatic type 2 diabetes, postsurgical hypothyroidism, hyperlipidemia. PMD:   Marcene Brawn, MD (Inactive).   Past Medical History:  Diagnosis Date   Anemia    iron deficiency anemia   Anxiety    Arthritis    Breast cancer (HCC) 2007   LT LUMPECTOMY   Cancer (HCC) 2007   L BREAST CANCER - CHEMO/RADIATION /LUMPECTOMY     Chronic kidney disease    stage 3   Complication of anesthesia    Diabetes mellitus without complication (HCC)    Type 2   Diverticulosis of colon with hemorrhage    Goiter    Heart murmur    Hyperlipidemia    Hypertension    Hyperthyroidism    Hypothyroidism    s/p thyroid ablation   Internal hemorrhoids    Neuromuscular disorder (HCC)    NEUROPATHY IN TOES   Nocturia    Ovarian carcinoma in situ    Personal history of chemotherapy    Personal history of radiation therapy    Pneumonia    Psoriasis    S/P chemotherapy, time since greater than 12 weeks 2007   BREAST CA   S/P radiation > 12 weeks 2007   BREAST CA   Thrombocytopenia (HCC)    Wears dentures    partial upper   Past Surgical History:  Procedure Laterality Date   ABDOMINAL HYSTERECTOMY     APPENDECTOMY     BREAST BIOPSY Left 2007   POS   BREAST LUMPECTOMY     CATARACT EXTRACTION W/PHACO Right 09/29/2017   Procedure: CATARACT EXTRACTION PHACO AND INTRAOCULAR LENS PLACEMENT (IOC)  RIGHT DIABETIC;  Surgeon: Lockie Mola, MD;  Location: [facility];  Service: Ophthalmology;  Laterality: Right;  Diabetic - oral meds   CATARACT EXTRACTION W/PHACO Left 02/09/2018   Procedure: CATARACT EXTRACTION PHACO AND INTRAOCULAR LENS PLACEMENT (IOC) LEFT  DIABETIC;  Surgeon: Lockie Mola, MD;  Location: [facility];  Service: Ophthalmology;  Laterality: Left;   CHOLECYSTECTOMY     COLONOSCOPY WITH PROPOFOL N/A 04/12/2015   Procedure: COLONOSCOPY WITH PROPOFOL;  Surgeon: Wallace Cullens, MD;  Location: [facility];  Service: Gastroenterology;  Laterality: N/A;   FINE NEEDLE ASPIRATION Left    left upper lobe thyroid - benign   INTRAMEDULLARY (IM) NAIL INTERTROCHANTERIC Right 08/06/2020   Procedure: INTRAMEDULLARY (IM) NAIL INTERTROCHANTRIC;  Surgeon: Vickki Hearing, MD;  Location: [facility];  Service: Orthopedics;  Laterality: Right;   MASTECTOMY PARTIAL / LUMPECTOMY Left 2007   chemo and radiation   OOPHORECTOMY     for carcinoma in situ   THYROIDECTOMY  11/19/2011   Procedure: THYROIDECTOMY;  Surgeon: Velora Heckler, MD;  Location: [facility];  Service: General;  Laterality: N/A;  Total Thyroidectomy   THYROIDECTOMY  Social History   Socioeconomic History   Marital status: Married    Spouse name: Not on file   Number of children: Not on file   Years of education: Not on file   Highest education level: Not on file  Occupational History   Not on file  Tobacco Use   Smoking status: Never   Smokeless tobacco: Never  Vaping Use   Vaping status: Never Used  Substance and Sexual Activity   Alcohol use: No   Drug use: No   Sexual activity: Not on file  Other Topics Concern   Not on file  Social History Narrative   Not on file   Social Determinants of Health   Financial Resource Strain: Not on file  Food Insecurity: Not on file  Transportation Needs: Not on file  Physical Activity: Not on file  Stress: Not on file  Social Connections: Not on file   Outpatient Encounter Medications as of 11/04/2022  Medication Sig   ACCU-CHEK GUIDE test strip TEST UP TO 2 TO 3 TIMES DAILY AS DIRECTED   acetaminophen (TYLENOL) 325 MG tablet Take 2 tablets (650 mg total) by mouth every 6 (six) hours as needed for mild pain, fever or  headache.   Alcohol Swabs (B-D SINGLE USE SWABS REGULAR) PADS USE TWICE DAILY   ALPRAZolam (XANAX) 0.25 MG tablet Take 0.25 mg by mouth daily as needed for anxiety.   Blood Glucose Monitoring Suppl (ACCU-CHEK GUIDE) w/Device KIT Test BG tid. E11.65   cetirizine (ZYRTEC) 10 MG tablet Take 10 mg by mouth at bedtime.   Cholecalciferol (VITAMIN D3) 1000 units CAPS Take 1,000 Units by mouth daily.   enalapril (VASOTEC) 2.5 MG tablet TAKE 1 TABLET ONE TIME EACH DAY   ferrous sulfate 325 (65 FE) MG tablet Take 1 tablet (325 mg total) by mouth 2 (two) times daily with a meal. (Patient taking differently: Take 325 mg by mouth daily with breakfast.)   fluticasone (FLONASE) 50 MCG/ACT nasal spray Place 1 spray into both nostrils daily as needed for allergies.   Lancets MISC Test BG tid. Accu-chek guide. E11.65   levothyroxine (SYNTHROID) 100 MCG tablet Take 100 mcg by mouth daily before breakfast.   lovastatin (MEVACOR) 40 MG tablet Take 40 mg by mouth at bedtime.    Omega-3 Fatty Acids (FISH OIL) 1000 MG CAPS Take 1,000 mg by mouth daily.   omeprazole (PRILOSEC) 40 MG capsule Take 40 mg daily by mouth.   pioglitazone (ACTOS) 15 MG tablet TAKE 1 TABLET (15 MG TOTAL) BY MOUTH DAILY.   polyethylene glycol (MIRALAX) 17 g packet Take 17 g by mouth 2 (two) times daily.   Polyethylene Glycol 400 (BLINK TEARS OP) Apply 1 drop to eye daily as needed (dry eye).   sitaGLIPtin (JANUVIA) 50 MG tablet Take 50 mg by mouth daily.   triamcinolone (KENALOG) 0.025 % cream Apply 1 Application topically 2 (two) times daily.   vitamin B-12 (CYANOCOBALAMIN) 1000 MCG tablet Take 1,000 mcg by mouth daily.   sodium chloride 1 g tablet Take 1 tablet (1 g total) by mouth daily. (Patient not taking: Reported on 04/08/2022)   No facility-administered encounter medications on file as of 11/04/2022.    ALLERGIES: Allergies  Allergen Reactions   Amoxicillin-Pot Clavulanate Other (See Comments)    nervousness   Citalopram      Other reaction(s): drowsiness, decreased libido   Citalopram Hydrobromide Other (See Comments)   Clonazepam     Other reaction(s): drowsiness   Diazepam  Other reaction(s): drowsiness   Empagliflozin     Other reaction(s): weak, tired   Escitalopram     Other reaction(s): decreased libido   Sertraline     Other reaction(s): drowsiness   Sertraline Hcl Other (See Comments)    VACCINATION STATUS: There is no immunization history for the selected administration types on file for this patient.  Diabetes She presents for her follow-up diabetic visit. She has type 2 diabetes mellitus. Onset time: She was diagnosed at approximate age of 33 years. Denies any history of gestational diabetes. Her disease course has been improving. There are no hypoglycemic associated symptoms. Pertinent negatives for hypoglycemia include no confusion, headaches, pallor or seizures. There are no diabetic associated symptoms. Pertinent negatives for diabetes include no chest pain, no fatigue, no polydipsia, no polyphagia and no polyuria. There are no hypoglycemic complications. Symptoms are stable. Diabetic complications include nephropathy and peripheral neuropathy. Risk factors for coronary artery disease include diabetes mellitus, dyslipidemia, family history, hypertension, obesity, sedentary lifestyle and post-menopausal. Current diabetic treatment includes oral agent (dual therapy). She is compliant with treatment most of the time. Her weight is fluctuating minimally. She is following a generally healthy diet. When asked about meal planning, she reported none. She has not had a previous visit with a dietitian. She never participates in exercise. Her home blood glucose trend is fluctuating minimally. (She presents today, accompanied by her daughter, with no meter or logs to review.  She reports evening readings are around the 130-140 range.  Her POCT A1c today is 7.4%, improving from last visit of 7.6%.  She denies any  s/s of hypoglycemia.) An ACE inhibitor/angiotensin II receptor blocker is not being taken. She does not see a podiatrist.Eye exam is current.  Hyperlipidemia This is a chronic problem. The current episode started more than 1 year ago. The problem is controlled. Exacerbating diseases include chronic renal disease, diabetes, hypothyroidism and obesity. Factors aggravating her hyperlipidemia include fatty foods. Pertinent negatives include no chest pain, myalgias or shortness of breath. Current antihyperlipidemic treatment includes statins. The current treatment provides mild improvement of lipids. There are no compliance problems.  Risk factors for coronary artery disease include diabetes mellitus, dyslipidemia, family history, hypertension, obesity, a sedentary lifestyle and post-menopausal.  Hypertension This is a chronic problem. The current episode started more than 1 year ago. The problem has been gradually improving since onset. The problem is controlled. Pertinent negatives include no chest pain, headaches, palpitations or shortness of breath. Agents associated with hypertension include thyroid hormones. Risk factors for coronary artery disease include diabetes mellitus, dyslipidemia, obesity, sedentary lifestyle and post-menopausal state. Past treatments include ACE inhibitors. The current treatment provides moderate improvement. There are no compliance problems.  Hypertensive end-organ damage includes kidney disease. Identifiable causes of hypertension include chronic renal disease and a thyroid problem.    Review of systems  Constitutional: + stable body weight,  current Body mass index is 30.61 kg/m. , no fatigue, no subjective hyperthermia, no subjective hypothermia Eyes: no blurry vision, no xerophthalmia ENT: no sore throat, no nodules palpated in throat, no dysphagia/odynophagia, no hoarseness Cardiovascular: no chest pain, no shortness of breath, no palpitations, no leg  swelling Respiratory: no cough, no shortness of breath Gastrointestinal: no nausea/vomiting/diarrhea Musculoskeletal: c/o bilateral knee pain (arthritis)- uses cane Skin: no rashes, no hyperemia Neurological: no tremors, no numbness, no tingling, no dizziness Psychiatric: no depression, no anxiety  Objective:    BP 130/62 (BP Location: Left Arm, Patient Position: Sitting, Cuff Size: Large)   Pulse  64   Ht 5\' 3"  (1.6 m)   Wt 172 lb 12.8 oz (78.4 kg)   BMI 30.61 kg/m   Wt Readings from Last 3 Encounters:  11/04/22 172 lb 12.8 oz (78.4 kg)  04/08/22 170 lb 3.2 oz (77.2 kg)  01/06/21 170 lb 3.2 oz (77.2 kg)    BP Readings from Last 3 Encounters:  11/04/22 130/62  04/08/22 (!) 142/68  01/06/21 (!) 179/67     Physical Exam- Limited  Constitutional:  Body mass index is 30.61 kg/m. , not in acute distress, normal state of mind Respiratory: Adequate breathing efforts, no crackles, rales, rhonchi, or wheezing Musculoskeletal: no gross deformities, uses cane to ambulate Skin:  no rashes, no hyperemia,  Neurological: no tremor with outstretched hands   Diabetic Foot Exam - Simple   No data filed     Recent Results (from the past 2160 hour(s))  HgB A1c     Status: Abnormal   Collection Time: 11/04/22  8:59 AM  Result Value Ref Range   Hemoglobin A1C 7.4 (A) 4.0 - 5.6 %   HbA1c POC (<> result, manual entry)     HbA1c, POC (prediabetic range)     HbA1c, POC (controlled diabetic range)      Lipid Panel     Component Value Date/Time   CHOL 149 11/03/2017 1512   TRIG 86 11/03/2017 1512   HDL 53 11/03/2017 1512   CHOLHDL 2.8 11/03/2017 1512   LDLCALC 79 11/03/2017 1512     Latest Reference Range & Units 10/03/19 08:42 12/28/19 10:02 02/27/20 11:10 12/30/20 10:27 04/02/22 14:57  TSH 0.40 - 4.50 mIU/L 0.84 5.22 (H) 3.12 1.96 1.38  T4,Free(Direct) 0.8 - 1.8 ng/dL 1.6 1.2 1.3 1.4 1.4  (H): Data is abnormally high  Assessment & Plan:   1) 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  She presents today, accompanied by her daughter, with no meter or logs to review.  She reports evening readings are around the 130-140 range.  Her POCT A1c today is 7.4%, improving from last visit of 7.6%.  She denies any s/s of hypoglycemia.    -her diabetes is complicated by stage 3 renal insufficiency which is improving, obesity/sedentary life and Madeline Fox remains at a high risk for more acute and chronic complications which include CAD, CVA, CKD, retinopathy, and neuropathy. These are all discussed in detail with the patient.  - Nutritional counseling repeated at each appointment due to patients tendency to fall back in to old habits.  - The patient admits there is a room for improvement in their diet and drink choices. -  Suggestion is made for the patient to avoid simple carbohydrates from their diet including Cakes, Sweet Desserts / Pastries, Ice Cream, Soda (diet and regular), Sweet Tea, Candies, Chips, Cookies, Sweet Pastries, Store Bought Juices, Alcohol in Excess of 1-2 drinks a day, Artificial Sweeteners, Coffee Creamer, and "Sugar-free" Products. This will help patient to have stable blood glucose profile and potentially avoid unintended weight gain.   - I encouraged the patient to switch to unprocessed or minimally processed complex starch and increased protein intake (animal or plant source), fruits, and vegetables.   - Patient is advised to stick to a routine mealtimes to eat 3 meals a day and avoid unnecessary snacks (to snack only to correct hypoglycemia).  - I have  approached her with the following individualized plan to manage diabetes and patient agrees:   -she will not require any additional treatment.  She is advised to continue Januvia 50 mg p.o. daily at breakfast and Actos 15 mg p.o. daily at  breakfast.  Instead, she is advised to continue her current healthy diet regimen and incorporate more physical exercise into her regimen.  - Patient specific target  A1c;  LDL, HDL, Triglycerides, and  Waist Circumference were discussed in detail.  2) BP/HTN: Her blood pressure is controlled to target.  She is advised to continue Enalapril 2.5 mg po daily.  3) Lipids/HPL:  She does not have recent lipid panel to review.  She is advised to continue Lovastatin 40 mg po daily at bedtime.  Side effects and precautions discussed with her.  4)  Weight/Diet:  Her Body mass index is 30.61 kg/m.-- not a candidate for major weight loss.  CDE Consult has been  initiated , exercise, and detailed carbohydrates information provided.  5) Postsurgical hypothyroidism - She underwent total thyroidectomy for large multinodular goiter in 2013.    -There are no recent TFTs to review.  She is advised to continue Levothyroxine 100 mcg po daily before breakfast.  Will recheck TFTs prior to next visit and adjust dose accordingly.   - We discussed about the correct intake of her thyroid hormone, on empty stomach at fasting, with water, separated by at least 30 minutes from breakfast and other medications,  and separated by more than 4 hours from calcium, iron, multivitamins, acid reflux medications (PPIs). -Patient is made aware of the fact that thyroid hormone replacement is needed for life, dose to be adjusted by periodic monitoring of thyroid function tests.  6) Chronic Care/Health Maintenance: -she is on ACE and on Statin medications and  is encouraged to continue to follow up with Ophthalmology, Dentist,  Podiatrist at least yearly or according to recommendations, and advised to stay away from smoking. I have recommended yearly flu vaccine and pneumonia vaccination at least every 5 years; moderate intensity exercise for up to 150 minutes weekly; and  sleep for at least 7 hours a day.  - I advised patient to  maintain close follow up with Marcene Brawn, MD (Inactive) for primary care needs.     I spent  36  minutes in the care of the patient today including review of labs from CMP, Lipids, Thyroid Function, Hematology (current and previous including abstractions from other facilities); face-to-face time discussing  her blood glucose readings/logs, discussing hypoglycemia and hyperglycemia episodes and symptoms, medications doses, her options of short and long term treatment based on the latest standards of care / guidelines;  discussion about incorporating lifestyle medicine;  and documenting the encounter. Risk reduction counseling performed per USPSTF guidelines to reduce obesity and cardiovascular risk factors.     Please refer to Patient Instructions for Blood Glucose Monitoring and Insulin/Medications Dosing Guide"  in media tab for additional information. Please  also refer to " Patient Self Inventory" in the Media  tab for reviewed elements of pertinent patient history.  Madeline Fox participated in the discussions, expressed understanding, and voiced agreement with the above plans.  All questions were answered to her satisfaction. she is encouraged to contact clinic should she have any questions or concerns prior to her return visit.    Follow up plan: - Return in about 4 months (around 03/07/2023) for Diabetes F/U with A1c in office, Thyroid follow up, Previsit labs.  Ronny Bacon, FNP-BC [facility] [address] [place], [place] [address] Phone: [phone] Fax: [phone]  11/04/2022, 10:35 AM

## 2022-12-30 ENCOUNTER — Telehealth: Payer: Self-pay

## 2022-12-30 ENCOUNTER — Other Ambulatory Visit: Payer: Self-pay

## 2022-12-30 DIAGNOSIS — E890 Postprocedural hypothyroidism: Secondary | ICD-10-CM

## 2022-12-30 MED ORDER — LEVOTHYROXINE SODIUM 100 MCG PO TABS: 100.0000 ug | ORAL_TABLET | Freq: Every day | ORAL | 0 refills | Status: DC

## 2022-12-30 NOTE — Telephone Encounter (Signed)
Left a message requesting pt to return call to the office. 

## 2023-01-12 ENCOUNTER — Other Ambulatory Visit: Payer: Self-pay

## 2023-01-12 DIAGNOSIS — N1831 Chronic kidney disease, stage 3a: Secondary | ICD-10-CM

## 2023-01-12 MED ORDER — ACCU-CHEK GUIDE W/DEVICE KIT: PACK | 0 refills | Status: DC

## 2023-01-12 MED ORDER — ACCU-CHEK GUIDE TEST VI STRP: 1.0000 | ORAL_STRIP | Freq: Every day | 2 refills | Status: DC

## 2023-01-12 MED ORDER — LANCETS MISC: 2 refills | Status: AC

## 2023-01-21 ENCOUNTER — Other Ambulatory Visit: Payer: Self-pay | Admitting: Nurse Practitioner

## 2023-01-21 DIAGNOSIS — E1122 Type 2 diabetes mellitus with diabetic chronic kidney disease: Secondary | ICD-10-CM

## 2023-03-01 ENCOUNTER — Other Ambulatory Visit: Payer: Self-pay | Admitting: Nurse Practitioner

## 2023-03-02 ENCOUNTER — Telehealth: Payer: Self-pay | Admitting: Nurse Practitioner

## 2023-03-02 LAB — TSH+FREE T4
Free T4: 1.4 ng/dL (ref 0.8–1.8)
TSH: 1.58 m[IU]/L (ref 0.40–4.50)

## 2023-03-02 NOTE — Telephone Encounter (Signed)
Pt wants you to check over her thyroid labs when they results. She thinks she needs a med adjustment. Please Advise.

## 2023-03-03 ENCOUNTER — Telehealth: Payer: Self-pay | Admitting: Nurse Practitioner

## 2023-03-03 NOTE — Telephone Encounter (Signed)
 Pt is requesting you check blood work and call her about possibly adjusting medication.  I explained to her she had an appointment next week where that would be discussed.  She stated she did blood work early in order to change meds earlier.  I also do not see that she did CMP.  Will you still see her without it?  She wants a call back from you or the nurse.

## 2023-03-03 NOTE — Telephone Encounter (Signed)
 Talked with the patient.She said that about 2 weeks ago she noted these symptoms. She is more nervous, feels like this makes her throat tight, tired , more than usual , she shares that she is fugitive and fees that this make her BP and her Blood Sugars go up.

## 2023-03-03 NOTE — Telephone Encounter (Signed)
 I saw a note saying something about her thinking her thyroid  dosage was off.  I saw her thyroid  labs and all was stable, about where it was last time it was checked.  Tammy, can you reach out and see what symptoms she may be experiencing?

## 2023-03-04 NOTE — Telephone Encounter (Signed)
 Talked with the patient shared Whitney's recommendation. She has appointment with her PCP, 03/11/23.

## 2023-03-04 NOTE — Telephone Encounter (Signed)
 My last telephone message to the patient, she shared that she was fidgety and feels this makes her blood pressure and blood sugars go up.

## 2023-03-04 NOTE — Telephone Encounter (Signed)
 I do not feel the thyroid  is the main cause of her symptoms, as everything was normal.  Perhaps she needs to see her PCP for other possibilities.

## 2023-03-10 ENCOUNTER — Ambulatory Visit: Payer: Medicare HMO | Admitting: Nurse Practitioner

## 2023-03-15 ENCOUNTER — Encounter: Payer: Self-pay | Admitting: Nurse Practitioner

## 2023-03-15 ENCOUNTER — Ambulatory Visit: Payer: Medicare HMO | Admitting: Nurse Practitioner

## 2023-03-15 VITALS — BP 140/60 | HR 77 | Ht 63.0 in | Wt 174.8 lb

## 2023-03-15 DIAGNOSIS — I10 Essential (primary) hypertension: Secondary | ICD-10-CM | POA: Diagnosis not present

## 2023-03-15 DIAGNOSIS — Z7984 Long term (current) use of oral hypoglycemic drugs: Secondary | ICD-10-CM

## 2023-03-15 DIAGNOSIS — E890 Postprocedural hypothyroidism: Secondary | ICD-10-CM

## 2023-03-15 DIAGNOSIS — E782 Mixed hyperlipidemia: Secondary | ICD-10-CM | POA: Diagnosis not present

## 2023-03-15 DIAGNOSIS — E1122 Type 2 diabetes mellitus with diabetic chronic kidney disease: Secondary | ICD-10-CM | POA: Diagnosis not present

## 2023-03-15 DIAGNOSIS — N1831 Chronic kidney disease, stage 3a: Secondary | ICD-10-CM | POA: Diagnosis not present

## 2023-03-15 LAB — POCT GLYCOSYLATED HEMOGLOBIN (HGB A1C): Hemoglobin A1C: 7.4 % — AB (ref 4.0–5.6)

## 2023-03-15 MED ORDER — PIOGLITAZONE HCL 15 MG PO TABS: 15.0000 mg | ORAL_TABLET | Freq: Every day | ORAL | 3 refills | Status: DC

## 2023-03-15 MED ORDER — LEVOTHYROXINE SODIUM 100 MCG PO TABS: 100.0000 ug | ORAL_TABLET | Freq: Every day | ORAL | 3 refills | Status: DC

## 2023-03-15 NOTE — Progress Notes (Signed)
03/15/2023, 2:57 PM                            Endocrinology Follow Up Visit  Subjective:    Patient ID: Madeline Fox, female    DOB: 1945/03/06.  Madeline Fox is being engaged in follow-up  for management of currently controlled symptomatic type 2 diabetes, postsurgical hypothyroidism, hyperlipidemia. PMD:   Marcene Brawn, MD (Inactive).   Past Medical History:  Diagnosis Date   Anemia    iron deficiency anemia   Anxiety    Arthritis    Breast cancer (HCC) 2007   LT LUMPECTOMY   Cancer (HCC) 2007   L BREAST CANCER - CHEMO/RADIATION /LUMPECTOMY     Chronic kidney disease    stage 3   Complication of anesthesia    Diabetes mellitus without complication (HCC)    Type 2   Diverticulosis of colon with hemorrhage    Goiter    Heart murmur    Hyperlipidemia    Hypertension    Hyperthyroidism    Hypothyroidism    s/p thyroid ablation   Internal hemorrhoids    Neuromuscular disorder (HCC)    NEUROPATHY IN TOES   Nocturia    Ovarian carcinoma in situ    Personal history of chemotherapy    Personal history of radiation therapy    Pneumonia    Psoriasis    S/P chemotherapy, time since greater than 12 weeks 2007   BREAST CA   S/P radiation > 12 weeks 2007   BREAST CA   Thrombocytopenia (HCC)    Wears dentures    partial upper   Past Surgical History:  Procedure Laterality Date   ABDOMINAL HYSTERECTOMY     APPENDECTOMY     BREAST BIOPSY Left 2007   POS   BREAST LUMPECTOMY     CATARACT EXTRACTION W/PHACO Right 09/29/2017   Procedure: CATARACT EXTRACTION PHACO AND INTRAOCULAR LENS PLACEMENT (IOC)  RIGHT DIABETIC;  Surgeon: Lockie Mola, MD;  Location: [facility];  Service: Ophthalmology;  Laterality: Right;  Diabetic - oral meds   CATARACT EXTRACTION W/PHACO Left 02/09/2018   Procedure: CATARACT EXTRACTION PHACO AND INTRAOCULAR LENS PLACEMENT (IOC) LEFT  DIABETIC;  Surgeon: Lockie Mola, MD;  Location: [facility];  Service: Ophthalmology;  Laterality: Left;   CHOLECYSTECTOMY     COLONOSCOPY WITH PROPOFOL N/A 04/12/2015   Procedure: COLONOSCOPY WITH PROPOFOL;  Surgeon: Wallace Cullens, MD;  Location: [facility];  Service: Gastroenterology;  Laterality: N/A;   FINE NEEDLE ASPIRATION Left    left upper lobe thyroid - benign   INTRAMEDULLARY (IM) NAIL INTERTROCHANTERIC Right 08/06/2020   Procedure: INTRAMEDULLARY (IM) NAIL INTERTROCHANTRIC;  Surgeon: Vickki Hearing, MD;  Location: [facility];  Service: Orthopedics;  Laterality: Right;   MASTECTOMY PARTIAL / LUMPECTOMY Left 2007   chemo and radiation   OOPHORECTOMY     for carcinoma in situ   THYROIDECTOMY  11/19/2011   Procedure: THYROIDECTOMY;  Surgeon: Velora Heckler, MD;  Location: [facility];  Service: General;  Laterality: N/A;  Total Thyroidectomy   THYROIDECTOMY  Social History   Socioeconomic History   Marital status: Married    Spouse name: Not on file   Number of children: Not on file   Years of education: Not on file   Highest education level: Not on file  Occupational History   Not on file  Tobacco Use   Smoking status: Never   Smokeless tobacco: Never  Vaping Use   Vaping status: Never Used  Substance and Sexual Activity   Alcohol use: No   Drug use: No   Sexual activity: Not on file  Other Topics Concern   Not on file  Social History Narrative   Not on file   Social Drivers of Health   Financial Resource Strain: Not on file  Food Insecurity: Not on file  Transportation Needs: Not on file  Physical Activity: Not on file  Stress: Not on file  Social Connections: Not on file   Outpatient Encounter Medications as of 03/15/2023  Medication Sig   acetaminophen (TYLENOL) 325 MG tablet Take 2 tablets (650 mg total) by mouth every 6 (six) hours as needed for mild pain, fever or headache.   Alcohol Swabs (B-D SINGLE USE SWABS REGULAR) PADS USE TWICE  DAILY   ALPRAZolam (XANAX) 0.25 MG tablet Take 0.25 mg by mouth daily as needed for anxiety.   Blood Glucose Monitoring Suppl (ACCU-CHEK GUIDE) w/Device KIT Use to test blood glucose daily as directed. E11.65   cetirizine (ZYRTEC) 10 MG tablet Take 10 mg by mouth at bedtime.   Cholecalciferol (VITAMIN D3) 1000 units CAPS Take 1,000 Units by mouth daily.   enalapril (VASOTEC) 2.5 MG tablet TAKE 1 TABLET ONE TIME EACH DAY   ferrous sulfate 325 (65 FE) MG tablet Take 1 tablet (325 mg total) by mouth 2 (two) times daily with a meal. (Patient taking differently: Take 325 mg by mouth daily with breakfast.)   fluticasone (FLONASE) 50 MCG/ACT nasal spray Place 1 spray into both nostrils daily as needed for allergies.   glucose blood (ACCU-CHEK GUIDE TEST) test strip 1 each by Other route daily. Use to check blood glucose daily as instructed.   Lancets MISC Use to test blood glucose daily as directed. Accu-chek guide. E11.65   levothyroxine (SYNTHROID) 100 MCG tablet Take 1 tablet (100 mcg total) by mouth daily before breakfast.   lovastatin (MEVACOR) 40 MG tablet Take 40 mg by mouth at bedtime.    Omega-3 Fatty Acids (FISH OIL) 1000 MG CAPS Take 1,000 mg by mouth daily.   omeprazole (PRILOSEC) 40 MG capsule Take 40 mg daily by mouth.   pioglitazone (ACTOS) 15 MG tablet TAKE 1 TABLET (15 MG TOTAL) BY MOUTH DAILY.   polyethylene glycol (MIRALAX) 17 g packet Take 17 g by mouth 2 (two) times daily.   Polyethylene Glycol 400 (BLINK TEARS OP) Apply 1 drop to eye daily as needed (dry eye).   sitaGLIPtin (JANUVIA) 50 MG tablet Take 50 mg by mouth daily.   triamcinolone (KENALOG) 0.025 % cream Apply 1 Application topically 2 (two) times daily.   vitamin B-12 (CYANOCOBALAMIN) 1000 MCG tablet Take 1,000 mcg by mouth daily.   sodium chloride 1 g tablet Take 1 tablet (1 g total) by mouth daily. (Patient not taking: Reported on 03/15/2023)   No facility-administered encounter medications on file as of 03/15/2023.     ALLERGIES: Allergies  Allergen Reactions   Amoxicillin-Pot Clavulanate Other (See Comments)    nervousness   Citalopram     Other reaction(s): drowsiness, decreased libido  Citalopram Hydrobromide Other (See Comments)   Clonazepam     Other reaction(s): drowsiness   Diazepam     Other reaction(s): drowsiness   Empagliflozin     Other reaction(s): weak, tired   Escitalopram     Other reaction(s): decreased libido   Sertraline     Other reaction(s): drowsiness   Sertraline Hcl Other (See Comments)    VACCINATION STATUS: There is no immunization history for the selected administration types on file for this patient.  Diabetes She presents for her follow-up diabetic visit. She has type 2 diabetes mellitus. Onset time: She was diagnosed at approximate age of 33 years. Denies any history of gestational diabetes. Her disease course has been stable. There are no hypoglycemic associated symptoms. Pertinent negatives for hypoglycemia include no confusion, headaches, pallor or seizures. There are no diabetic associated symptoms. Pertinent negatives for diabetes include no chest pain, no fatigue, no polydipsia, no polyphagia and no polyuria. There are no hypoglycemic complications. Symptoms are stable. Diabetic complications include nephropathy and peripheral neuropathy. Risk factors for coronary artery disease include diabetes mellitus, dyslipidemia, family history, hypertension, obesity, sedentary lifestyle and post-menopausal. Current diabetic treatment includes oral agent (dual therapy). She is compliant with treatment most of the time. Her weight is fluctuating minimally. She is following a generally healthy diet. When asked about meal planning, she reported none. She has not had a previous visit with a dietitian. She never participates in exercise. Her home blood glucose trend is fluctuating minimally. (She presents today, accompanied by her daughter, with no meter or logs to review.  She  reports evening readings are around the 130-140 range sometimes spiking to 200 range but it always comes down.  Her POCT A1c today is 7.4%, unchanged from previous visit.  She denies any s/s of hypoglycemia.) An ACE inhibitor/angiotensin II receptor blocker is not being taken. She does not see a podiatrist.Eye exam is current.  Hyperlipidemia This is a chronic problem. The current episode started more than 1 year ago. The problem is controlled. Exacerbating diseases include chronic renal disease, diabetes, hypothyroidism and obesity. Factors aggravating her hyperlipidemia include fatty foods. Pertinent negatives include no chest pain, myalgias or shortness of breath. Current antihyperlipidemic treatment includes statins. The current treatment provides mild improvement of lipids. There are no compliance problems.  Risk factors for coronary artery disease include diabetes mellitus, dyslipidemia, family history, hypertension, obesity, a sedentary lifestyle and post-menopausal.  Hypertension This is a chronic problem. The current episode started more than 1 year ago. The problem has been gradually improving since onset. The problem is controlled. Pertinent negatives include no chest pain, headaches, palpitations or shortness of breath. Agents associated with hypertension include thyroid hormones. Risk factors for coronary artery disease include diabetes mellitus, dyslipidemia, obesity, sedentary lifestyle and post-menopausal state. Past treatments include ACE inhibitors. The current treatment provides moderate improvement. There are no compliance problems.  Hypertensive end-organ damage includes kidney disease. Identifiable causes of hypertension include chronic renal disease and a thyroid problem.    Review of systems  Constitutional: + stable body weight,  current Body mass index is 30.96 kg/m. , no fatigue, no subjective hyperthermia, no subjective hypothermia Eyes: no blurry vision, no xerophthalmia ENT:  no sore throat, no nodules palpated in throat, no dysphagia/odynophagia, no hoarseness Cardiovascular: no chest pain, no shortness of breath, no palpitations, no leg swelling Respiratory: no cough, no shortness of breath Gastrointestinal: no nausea/vomiting/diarrhea Musculoskeletal: c/o bilateral knee pain (arthritis)- uses cane Skin: no rashes, no hyperemia Neurological: no tremors, no  numbness, no tingling, no dizziness Psychiatric: no depression, no anxiety  Objective:    BP (!) 140/60 (BP Location: Right Arm, Patient Position: Sitting, Cuff Size: Large)   Pulse 77   Ht 5\' 3"  (1.6 m)   Wt 174 lb 12.8 oz (79.3 kg)   BMI 30.96 kg/m   Wt Readings from Last 3 Encounters:  03/15/23 174 lb 12.8 oz (79.3 kg)  11/04/22 172 lb 12.8 oz (78.4 kg)  04/08/22 170 lb 3.2 oz (77.2 kg)    BP Readings from Last 3 Encounters:  03/15/23 (!) 140/60  11/04/22 130/62  04/08/22 (!) 142/68     Physical Exam- Limited  Constitutional:  Body mass index is 30.96 kg/m. , not in acute distress, normal state of mind Respiratory: Adequate breathing efforts, no crackles, rales, rhonchi, or wheezing Musculoskeletal: no gross deformities, uses cane to ambulate Skin:  no rashes, no hyperemia,  Neurological: no tremor with outstretched hands   Diabetic Foot Exam - Simple   No data filed     Recent Results (from the past 2160 hours)  TSH+Free T4     Status: None   Collection Time: 03/01/23  3:29 PM  Result Value Ref Range   TSH 1.58 0.40 - 4.50 mIU/L   Free T4 1.4 0.8 - 1.8 ng/dL    Lipid Panel     Component Value Date/Time   CHOL 149 11/03/2017 1512   TRIG 86 11/03/2017 1512   HDL 53 11/03/2017 1512   CHOLHDL 2.8 11/03/2017 1512   LDLCALC 79 11/03/2017 1512     Latest Reference Range & Units 10/03/19 08:42 12/28/19 10:02 02/27/20 11:10 12/30/20 10:27 04/02/22 14:57  TSH 0.40 - 4.50 mIU/L 0.84 5.22 (H) 3.12 1.96 1.38  T4,Free(Direct) 0.8 - 1.8 ng/dL 1.6 1.2 1.3 1.4 1.4  (H): Data is  abnormally high  Assessment & Plan:   1) 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  She presents today, accompanied by her daughter, with no meter or logs to review.  She reports evening readings are around the 130-140 range sometimes spiking to 200 range but it always comes down.  Her POCT A1c today is 7.4%, unchanged from previous visit.  She denies any s/s of hypoglycemia.    -her diabetes is complicated by stage 3 renal insufficiency which is improving, obesity/sedentary life and Madeline Fox remains at a high risk for more acute and chronic complications which include CAD, CVA, CKD, retinopathy, and neuropathy. These are all discussed in detail with the patient.  - Nutritional counseling repeated at each appointment due to patients tendency to fall back in to old habits.  - The patient admits there is a room for improvement in their diet and drink choices. -  Suggestion is made for the patient to avoid simple carbohydrates from their diet including Cakes, Sweet Desserts / Pastries, Ice Cream, Soda (diet and regular), Sweet Tea, Candies, Chips, Cookies, Sweet Pastries, Store Bought Juices, Alcohol in Excess of 1-2 drinks a day, Artificial Sweeteners, Coffee Creamer, and "Sugar-free" Products. This will help patient to have stable blood glucose profile and potentially avoid unintended weight gain.   - I encouraged the patient to switch to unprocessed or minimally processed complex starch and increased protein intake (animal or plant source), fruits, and vegetables.   - Patient is advised to stick to a routine mealtimes to  eat 3 meals a day and avoid unnecessary snacks (to snack only to correct hypoglycemia).  - I have approached her with the following individualized plan to manage diabetes and patient agrees:   -She will not  require any additional treatment given stable glycemic profile and A1c.  She is advised to continue Januvia 50 mg p.o. daily at breakfast and Actos 15 mg p.o. daily at breakfast.   - Patient specific target  A1c;  LDL, HDL, Triglycerides, and  Waist Circumference were discussed in detail.  2) BP/HTN: Her blood pressure is controlled to target.  She is advised to continue Enalapril 2.5 mg po daily.  3) Lipids/HPL:  She does not have recent lipid panel to review.  She is advised to continue Lovastatin 40 mg po daily at bedtime.  Side effects and precautions discussed with her.  4)  Weight/Diet:  Her Body mass index is 30.96 kg/m.-- not a candidate for major weight loss.  CDE Consult has been  initiated , exercise, and detailed carbohydrates information provided.  5) Postsurgical hypothyroidism - She underwent total thyroidectomy for large multinodular goiter in 2013.    -There are no recent TFTs to review.  She is advised to continue Levothyroxine 100 mcg po daily before breakfast.  Will recheck TFTs prior to next visit and adjust dose accordingly.   - We discussed about the correct intake of her thyroid hormone, on empty stomach at fasting, with water, separated by at least 30 minutes from breakfast and other medications,  and separated by more than 4 hours from calcium, iron, multivitamins, acid reflux medications (PPIs). -Patient is made aware of the fact that thyroid hormone replacement is needed for life, dose to be adjusted by periodic monitoring of thyroid function tests.  6) Chronic Care/Health Maintenance: -she is on ACE and on Statin medications and  is encouraged to continue to follow up with Ophthalmology, Dentist,  Podiatrist at least yearly or according to recommendations, and advised to stay away from smoking. I have recommended yearly flu vaccine and pneumonia vaccination at least every 5 years; moderate intensity exercise for up to 150 minutes weekly; and  sleep for at least 7  hours a day.  - I advised patient to maintain close follow up with Marcene Brawn, MD (Inactive) for primary care needs.     I spent  38  minutes in the care of the patient today including review of labs from CMP, Lipids, Thyroid Function, Hematology (current and previous including abstractions from other facilities); face-to-face time discussing  her blood glucose readings/logs, discussing hypoglycemia and hyperglycemia episodes and symptoms, medications doses, her options of short and long term treatment based on the latest standards of care / guidelines;  discussion about incorporating lifestyle medicine;  and documenting the encounter. Risk reduction counseling performed per USPSTF guidelines to reduce obesity and cardiovascular risk factors.     Please refer to Patient Instructions for Blood Glucose Monitoring and Insulin/Medications Dosing Guide"  in media tab for additional information. Please  also refer to " Patient Self Inventory" in the Media  tab for reviewed elements of pertinent patient history.  Madeline Fox participated in the discussions, expressed understanding, and voiced agreement with the above plans.  All questions were answered to her satisfaction. she is encouraged to contact clinic should she have any questions or concerns prior to her return visit.    Follow up plan: - No follow-ups on file.  Ronny Bacon, [facility] [address] [place], [place] [address]  Phone: [phone] Fax: [phone]  03/15/2023, 2:57 PM

## 2023-03-15 NOTE — Patient Instructions (Signed)

## 2023-03-31 ENCOUNTER — Other Ambulatory Visit: Payer: Self-pay | Admitting: Nurse Practitioner

## 2023-03-31 DIAGNOSIS — Z1231 Encounter for screening mammogram for malignant neoplasm of breast: Secondary | ICD-10-CM

## 2023-05-20 ENCOUNTER — Ambulatory Visit
Admission: RE | Admit: 2023-05-20 | Discharge: 2023-05-20 | Disposition: A | Discharge status: Home / Self Care | Source: Ambulatory Visit | Attending: Nurse Practitioner | Admitting: Nurse Practitioner

## 2023-05-20 DIAGNOSIS — Z1231 Encounter for screening mammogram for malignant neoplasm of breast: Secondary | ICD-10-CM | POA: Diagnosis present

## 2023-09-08 ENCOUNTER — Other Ambulatory Visit: Payer: Self-pay | Admitting: Nurse Practitioner

## 2023-09-09 LAB — COMPREHENSIVE METABOLIC PANEL WITH GFR
AG Ratio: 1.5 (calc) (ref 1.0–2.5)
ALT: 9 U/L (ref 6–29)
AST: 17 U/L (ref 10–35)
Albumin: 4.3 g/dL (ref 3.6–5.1)
Alkaline phosphatase (APISO): 74 U/L (ref 37–153)
BUN/Creatinine Ratio: 18 (calc) (ref 6–22)
BUN: 22 mg/dL (ref 7–25)
CO2: 28 mmol/L (ref 20–32)
Calcium: 9.4 mg/dL (ref 8.6–10.4)
Chloride: 95 mmol/L — ABNORMAL LOW (ref 98–110)
Creat: 1.19 mg/dL — ABNORMAL HIGH (ref 0.60–0.95)
Globulin: 2.9 g/dL (ref 1.9–3.7)
Glucose, Bld: 175 mg/dL — ABNORMAL HIGH (ref 65–99)
Potassium: 5 mmol/L (ref 3.5–5.3)
Sodium: 130 mmol/L — ABNORMAL LOW (ref 135–146)
Total Bilirubin: 0.4 mg/dL (ref 0.2–1.2)
Total Protein: 7.2 g/dL (ref 6.1–8.1)
eGFR: 46 mL/min/1.73m2 — ABNORMAL LOW (ref >=60)

## 2023-09-09 LAB — T4, FREE: Free T4: 1.3 ng/dL (ref 0.8–1.8)

## 2023-09-09 LAB — TSH: TSH: 4.91 m[IU]/L — ABNORMAL HIGH (ref 0.40–4.50)

## 2023-09-13 ENCOUNTER — Ambulatory Visit: Payer: Medicare HMO | Admitting: Nurse Practitioner

## 2023-09-13 ENCOUNTER — Encounter: Payer: Self-pay | Admitting: Nurse Practitioner

## 2023-09-13 VITALS — BP 124/60 | HR 62 | Ht 63.0 in | Wt 179.6 lb

## 2023-09-13 DIAGNOSIS — E890 Postprocedural hypothyroidism: Secondary | ICD-10-CM

## 2023-09-13 DIAGNOSIS — E1122 Type 2 diabetes mellitus with diabetic chronic kidney disease: Secondary | ICD-10-CM | POA: Diagnosis not present

## 2023-09-13 DIAGNOSIS — Z7984 Long term (current) use of oral hypoglycemic drugs: Secondary | ICD-10-CM

## 2023-09-13 DIAGNOSIS — N1831 Chronic kidney disease, stage 3a: Secondary | ICD-10-CM

## 2023-09-13 DIAGNOSIS — E782 Mixed hyperlipidemia: Secondary | ICD-10-CM | POA: Diagnosis not present

## 2023-09-13 DIAGNOSIS — I10 Essential (primary) hypertension: Secondary | ICD-10-CM | POA: Diagnosis not present

## 2023-09-13 LAB — POCT GLYCOSYLATED HEMOGLOBIN (HGB A1C)

## 2023-09-13 MED ORDER — LEVOTHYROXINE SODIUM 112 MCG PO TABS: 112.0000 ug | ORAL_TABLET | Freq: Every day | ORAL | 1 refills | Status: AC

## 2023-09-13 NOTE — Progress Notes (Signed)
 09/13/2023, 3:18 PM                            Endocrinology Follow Up Visit  Subjective:    Patient ID: Madeline Fox, female    DOB: 06/28/1945.  Madeline Fox is being engaged in follow-up  for management of currently controlled symptomatic type 2 diabetes, postsurgical hypothyroidism, hyperlipidemia. PMD:   Margarete Maeola DASEN, FNP.   Past Medical History:  Diagnosis Date   Anemia    iron deficiency anemia   Anxiety    Arthritis    Breast cancer (HCC) 2007   LT LUMPECTOMY   Cancer (HCC) 2007   L BREAST CANCER - CHEMO/RADIATION /LUMPECTOMY     Chronic kidney disease    stage 3   Complication of anesthesia    Diabetes mellitus without complication (HCC)    Type 2   Diverticulosis of colon with hemorrhage    Goiter    Heart murmur    Hyperlipidemia    Hypertension    Hyperthyroidism    Hypothyroidism    s/p thyroid  ablation   Internal hemorrhoids    Neuromuscular disorder (HCC)    NEUROPATHY IN TOES   Nocturia    Ovarian carcinoma in situ    Personal history of chemotherapy    Personal history of radiation therapy    Pneumonia    Psoriasis    S/P chemotherapy, time since greater than 12 weeks 2007   BREAST CA   S/P radiation > 12 weeks 2007   BREAST CA   Thrombocytopenia (HCC)    Wears dentures    partial upper   Past Surgical History:  Procedure Laterality Date   ABDOMINAL HYSTERECTOMY     APPENDECTOMY     BREAST BIOPSY Left 2007   POS   BREAST LUMPECTOMY     CATARACT EXTRACTION W/PHACO Right 09/29/2017   Procedure: CATARACT EXTRACTION PHACO AND INTRAOCULAR LENS PLACEMENT (IOC)  RIGHT DIABETIC;  Surgeon: Mittie Gaskin, MD;  Location: [facility];  Service: Ophthalmology;  Laterality: Right;  Diabetic - oral meds   CATARACT EXTRACTION W/PHACO Left 02/09/2018   Procedure: CATARACT EXTRACTION PHACO AND INTRAOCULAR LENS PLACEMENT (IOC) LEFT DIABETIC;  Surgeon:  Mittie Gaskin, MD;  Location: [facility];  Service: Ophthalmology;  Laterality: Left;   CHOLECYSTECTOMY     COLONOSCOPY WITH PROPOFOL  N/A 04/12/2015   Procedure: COLONOSCOPY WITH PROPOFOL ;  Surgeon: Deward CINDERELLA Piedmont, MD;  Location: [facility];  Service: Gastroenterology;  Laterality: N/A;   FINE NEEDLE ASPIRATION Left    left upper lobe thyroid  - benign   INTRAMEDULLARY (IM) NAIL INTERTROCHANTERIC Right 08/06/2020   Procedure: INTRAMEDULLARY (IM) NAIL INTERTROCHANTRIC;  Surgeon: Margrette Taft BRAVO, MD;  Location: [facility];  Service: Orthopedics;  Laterality: Right;   MASTECTOMY PARTIAL / LUMPECTOMY Left 2007   chemo and radiation   OOPHORECTOMY     for carcinoma in situ   THYROIDECTOMY  11/19/2011   Procedure: THYROIDECTOMY;  Surgeon: Krystal CHRISTELLA Spinner, MD;  Location: [facility];  Service: General;  Laterality: N/A;  Total Thyroidectomy   THYROIDECTOMY  Social History   Socioeconomic History   Marital status: Married    Spouse name: Not on file   Number of children: Not on file   Years of education: Not on file   Highest education level: Not on file  Occupational History   Not on file  Tobacco Use   Smoking status: Never   Smokeless tobacco: Never  Vaping Use   Vaping status: Never Used  Substance and Sexual Activity   Alcohol use: No   Drug use: No   Sexual activity: Not on file  Other Topics Concern   Not on file  Social History Narrative   Not on file   Social Drivers of Health   Financial Resource Strain: Not on file  Food Insecurity: Not on file  Transportation Needs: Not on file  Physical Activity: Not on file  Stress: Not on file  Social Connections: Not on file   Outpatient Encounter Medications as of 09/13/2023  Medication Sig   acetaminophen  (TYLENOL ) 325 MG tablet Take 2 tablets (650 mg total) by mouth every 6 (six) hours as needed for mild pain, fever or headache.   Alcohol Swabs (B-D SINGLE USE SWABS REGULAR) PADS USE TWICE DAILY   ALPRAZolam   (XANAX ) 0.25 MG tablet Take 0.25 mg by mouth daily as needed for anxiety.   Blood Glucose Monitoring Suppl (ACCU-CHEK GUIDE) w/Device KIT Use to test blood glucose daily as directed. E11.65   cetirizine (ZYRTEC) 10 MG tablet Take 10 mg by mouth at bedtime.   Cholecalciferol  (VITAMIN D3) 1000 units CAPS Take 1,000 Units by mouth daily.   ferrous sulfate  325 (65 FE) MG tablet Take 1 tablet (325 mg total) by mouth 2 (two) times daily with a meal. (Patient taking differently: Take 325 mg by mouth daily with breakfast.)   fluticasone  (FLONASE ) 50 MCG/ACT nasal spray Place 1 spray into both nostrils daily as needed for allergies.   glucose blood (ACCU-CHEK GUIDE TEST) test strip 1 each by Other route daily. Use to check blood glucose daily as instructed.   lovastatin (MEVACOR) 40 MG tablet Take 40 mg by mouth at bedtime.    Omega-3 Fatty Acids (FISH OIL) 1000 MG CAPS Take 1,000 mg by mouth daily.   omeprazole (PRILOSEC) 40 MG capsule Take 40 mg daily by mouth.   pioglitazone  (ACTOS ) 15 MG tablet Take 1 tablet (15 mg total) by mouth daily.   polyethylene glycol (MIRALAX ) 17 g packet Take 17 g by mouth 2 (two) times daily. (Patient taking differently: Take 17 g by mouth 2 (two) times daily. As needed)   Polyethylene Glycol 400 (BLINK TEARS OP) Apply 1 drop to eye daily as needed (dry eye).   sitaGLIPtin  (JANUVIA ) 50 MG tablet Take 50 mg by mouth daily.   triamcinolone  (KENALOG ) 0.025 % cream Apply 1 Application topically 2 (two) times daily.   [DISCONTINUED] levothyroxine  (SYNTHROID ) 100 MCG tablet Take 1 tablet (100 mcg total) by mouth daily before breakfast.   enalapril  (VASOTEC ) 2.5 MG tablet TAKE 1 TABLET ONE TIME EACH DAY   Lancets MISC Use to test blood glucose daily as directed. Accu-chek guide. E11.65   levothyroxine  (SYNTHROID ) 112 MCG tablet Take 1 tablet (112 mcg total) by mouth daily before breakfast.   sodium chloride  1 g tablet Take 1 tablet (1 g total) by mouth daily. (Patient not taking:  Reported on 09/13/2023)   vitamin B-12 (CYANOCOBALAMIN ) 1000 MCG tablet Take 1,000 mcg by mouth daily.   No facility-administered encounter medications on file as of 09/13/2023.  ALLERGIES: Allergies  Allergen Reactions   Amoxicillin-Pot Clavulanate Other (See Comments)    nervousness   Citalopram     Other reaction(s): drowsiness, decreased libido   Citalopram Hydrobromide Other (See Comments)   Clonazepam     Other reaction(s): drowsiness   Diazepam     Other reaction(s): drowsiness   Empagliflozin     Other reaction(s): weak, tired   Escitalopram     Other reaction(s): decreased libido   Sertraline     Other reaction(s): drowsiness   Sertraline Hcl Other (See Comments)    VACCINATION STATUS: There is no immunization history for the selected administration types on file for this patient.  Diabetes She presents for her follow-up diabetic visit. She has type 2 diabetes mellitus. Onset time: She was diagnosed at approximate age of 33 years. Denies any history of gestational diabetes. Her disease course has been stable. There are no hypoglycemic associated symptoms. Pertinent negatives for hypoglycemia include no confusion, headaches, pallor or seizures. There are no diabetic associated symptoms. Pertinent negatives for diabetes include no chest pain, no fatigue, no polydipsia, no polyphagia and no polyuria. There are no hypoglycemic complications. Symptoms are stable. Diabetic complications include nephropathy and peripheral neuropathy. Risk factors for coronary artery disease include diabetes mellitus, dyslipidemia, family history, hypertension, obesity, sedentary lifestyle and post-menopausal. Current diabetic treatment includes oral agent (dual therapy). She is compliant with treatment most of the time. Her weight is fluctuating minimally. She is following a generally healthy diet. When asked about meal planning, she reported none. She has not had a previous visit with a dietitian.  She never participates in exercise. (She presents today, accompanied by her daughter, with no meter or logs to review.  She reports evening readings are around the 180 range sometimes spiking to 200 range but it always comes down.  Her POCT A1c today is 7.8%, increasing from last visit of 7.4%.  She denies any s/s of hypoglycemia.  She eats fairly well, will have a SF cookie on occasion.  She admits she doesn't eat a lot at her meals though.) An ACE inhibitor/angiotensin II receptor blocker is not being taken. She does not see a podiatrist.Eye exam is current.  Hyperlipidemia This is a chronic problem. The current episode started more than 1 year ago. The problem is controlled. Exacerbating diseases include chronic renal disease, diabetes, hypothyroidism and obesity. Factors aggravating her hyperlipidemia include fatty foods. Pertinent negatives include no chest pain, myalgias or shortness of breath. Current antihyperlipidemic treatment includes statins. The current treatment provides mild improvement of lipids. There are no compliance problems.  Risk factors for coronary artery disease include diabetes mellitus, dyslipidemia, family history, hypertension, obesity, a sedentary lifestyle and post-menopausal.  Hypertension This is a chronic problem. The current episode started more than 1 year ago. The problem has been gradually improving since onset. The problem is controlled. Pertinent negatives include no chest pain, headaches, palpitations or shortness of breath. Agents associated with hypertension include thyroid  hormones. Risk factors for coronary artery disease include diabetes mellitus, dyslipidemia, obesity, sedentary lifestyle and post-menopausal state. Past treatments include ACE inhibitors. The current treatment provides moderate improvement. There are no compliance problems.  Hypertensive end-organ damage includes kidney disease. Identifiable causes of hypertension include chronic renal disease and a  thyroid  problem.    Review of systems  Constitutional: + increasing body weight,  current Body mass index is 31.81 kg/m. , no fatigue, no subjective hyperthermia, no subjective hypothermia Eyes: no blurry vision, no xerophthalmia ENT: no sore throat, no  nodules palpated in throat, no dysphagia/odynophagia, no hoarseness Cardiovascular: no chest pain, no shortness of breath, no palpitations, no leg swelling Respiratory: no cough, no shortness of breath Gastrointestinal: no nausea/vomiting/diarrhea Musculoskeletal: c/o bilateral knee pain (arthritis)- uses cane, left leg weakness, + generalized muscle aches Skin: no rashes, no hyperemia Neurological: no tremors, no numbness, no tingling, no dizziness Psychiatric: no depression, no anxiety, + poor sleep quality  Objective:    BP 124/60 (BP Location: Right Arm, Patient Position: Sitting, Cuff Size: Large)   Pulse 62   Ht 5' 3 (1.6 m)   Wt 179 lb 9.6 oz (81.5 kg)   BMI 31.81 kg/m   Wt Readings from Last 3 Encounters:  09/13/23 179 lb 9.6 oz (81.5 kg)  03/15/23 174 lb 12.8 oz (79.3 kg)  11/04/22 172 lb 12.8 oz (78.4 kg)    BP Readings from Last 3 Encounters:  09/13/23 124/60  03/15/23 (!) 140/60  11/04/22 130/62     Physical Exam- Limited  Constitutional:  Body mass index is 31.81 kg/m. , not in acute distress, normal state of mind Respiratory: Adequate breathing efforts, no crackles, rales, rhonchi, or wheezing Musculoskeletal: no gross deformities, uses cane to ambulate Skin:  no rashes, no hyperemia,  Neurological: no tremor with outstretched hands   Diabetic Foot Exam - Simple   Simple Foot Form Diabetic Foot exam was performed with the following findings: Yes 09/13/2023  2:45 PM  Visual Inspection No deformities, no ulcerations, no other skin breakdown bilaterally: Yes Sensation Testing Intact to touch and monofilament testing bilaterally: Yes Pulse Check Posterior Tibialis and Dorsalis pulse intact bilaterally:  Yes Comments     Recent Results (from the past 2160 hours)  Comprehensive metabolic panel with GFR     Status: Abnormal   Collection Time: 09/08/23 10:52 AM  Result Value Ref Range   Glucose, Bld 175 (H) 65 - 99 mg/dL    Comment: .            Fasting reference interval . For someone without known diabetes, a glucose value >125 mg/dL indicates that they may have diabetes and this should be confirmed with a follow-up test. .    BUN 22 7 - 25 mg/dL   Creat 8.80 (H) 9.39 - 0.95 mg/dL   eGFR 46 (L) > OR = 60 mL/min/1.35m2   BUN/Creatinine Ratio 18 6 - 22 (calc)   Sodium 130 (L) 135 - 146 mmol/L   Potassium 5.0 3.5 - 5.3 mmol/L   Chloride 95 (L) 98 - 110 mmol/L   CO2 28 20 - 32 mmol/L   Calcium  9.4 8.6 - 10.4 mg/dL   Total Protein 7.2 6.1 - 8.1 g/dL   Albumin 4.3 3.6 - 5.1 g/dL   Globulin 2.9 1.9 - 3.7 g/dL (calc)   AG Ratio 1.5 1.0 - 2.5 (calc)   Total Bilirubin 0.4 0.2 - 1.2 mg/dL   Alkaline phosphatase (APISO) 74 37 - 153 U/L   AST 17 10 - 35 U/L   ALT 9 6 - 29 U/L  TSH     Status: Abnormal   Collection Time: 09/08/23 10:52 AM  Result Value Ref Range   TSH 4.91 (H) 0.40 - 4.50 mIU/L  T4, free     Status: None   Collection Time: 09/08/23 10:52 AM  Result Value Ref Range   Free T4 1.3 0.8 - 1.8 ng/dL  HgB J8r     Status: Abnormal   Collection Time: 09/13/23  2:47 PM  Result Value Ref Range  Hemoglobin A1C     HbA1c POC (<> result, manual entry)     HbA1c, POC (prediabetic range)     HbA1c, POC (controlled diabetic range)      Lipid Panel     Component Value Date/Time   CHOL 149 11/03/2017 1512   TRIG 86 11/03/2017 1512   HDL 53 11/03/2017 1512   CHOLHDL 2.8 11/03/2017 1512   LDLCALC 79 11/03/2017 1512     Latest Reference Range & Units 12/28/19 10:02 02/27/20 11:10 12/30/20 10:27 04/02/22 14:57 03/01/23 15:29 09/08/23 10:52  TSH 0.40 - 4.50 mIU/L 5.22 (H) 3.12 1.96 1.38 1.58 4.91 (H)  T4,Free(Direct) 0.8 - 1.8 ng/dL 1.2 1.3 1.4 1.4 1.4 1.3  (H): Data is  abnormally high  Assessment & Plan:   1) 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  She presents today, accompanied by her daughter, with no meter or logs to review.  She reports evening readings are around the 180 range sometimes spiking to 200 range but it always comes down.  Her POCT A1c today is 7.8%, increasing from last visit of 7.4%.  She denies any s/s of hypoglycemia.  She eats fairly well, will have a SF cookie on occasion.  She admits she doesn't eat a lot at her meals though.    -her diabetes is complicated by stage 3 renal insufficiency which is improving, obesity/sedentary life and Madeline Fox remains at a high risk for more acute and chronic complications which include CAD, CVA, CKD, retinopathy, and neuropathy. These are all discussed in detail with the patient.  - Nutritional counseling repeated at each appointment due to patients tendency to fall back in to old habits.  - The patient admits there is a room for improvement in their diet and drink choices. -  Suggestion is made for the patient to avoid simple carbohydrates from their diet including Cakes, Sweet Desserts / Pastries, Ice Cream, Soda (diet and regular), Sweet Tea, Candies, Chips, Cookies, Sweet Pastries, Store Bought Juices, Alcohol in Excess of 1-2 drinks a day, Artificial Sweeteners, Coffee Creamer, and Sugar-free Products. This will help patient to have stable blood glucose profile and potentially avoid unintended weight gain.   - I encouraged the patient to switch to unprocessed or minimally processed complex starch and increased protein intake (animal or plant source), fruits, and vegetables.   - Patient is advised to stick to a routine mealtimes to eat 3 meals a day and avoid unnecessary snacks (to snack only to correct hypoglycemia).  - I have  approached her with the following individualized plan to manage diabetes and patient agrees:   -She will not require any additional treatment given stable glycemic profile and A1c.  She is advised to continue Januvia  50 mg p.o. daily at breakfast and Actos  15 mg p.o. daily at breakfast.   Goal A1c for her due to age and co-morbidities would be less than 8%.  - Patient specific target  A1c;  LDL, HDL, Triglycerides, and  Waist Circumference were discussed in detail.  2) BP/HTN: Her blood pressure is controlled to target.  She is advised to continue Enalapril  2.5 mg po daily.  3) Lipids/HPL:  She does not have recent lipid panel to review.  She is advised to continue Lovastatin 40 mg po daily at bedtime.  Side effects and precautions discussed with her.  4)  Weight/Diet:  Her Body mass index is 31.81 kg/m.-- not a candidate for major weight loss.  CDE Consult has been  initiated , exercise, and detailed carbohydrates information provided.  5) Postsurgical hypothyroidism - She underwent total thyroidectomy for large multinodular goiter in 2013.    -Her previsit TFTs are consistent with slight under-replacement (TSH slightly elevated and FT4 is normal but slowly dropping).  She is advised to increase Levothyroxine  to 112 mcg po daily before breakfast.  We did go over S/s of over-replacement to watch for.   - We discussed about the correct intake of her thyroid  hormone, on empty stomach at fasting, with water, separated by at least 30 minutes from breakfast and other medications,  and separated by more than 4 hours from calcium , iron, multivitamins, acid reflux medications (PPIs). -Patient is made aware of the fact that thyroid  hormone replacement is needed for life, dose to be adjusted by periodic monitoring of thyroid  function tests.  6) Chronic Care/Health Maintenance: -she is on ACE and on Statin medications and  is encouraged to continue to follow up with Ophthalmology, Dentist,  Podiatrist  at least yearly or according to recommendations, and advised to stay away from smoking. I have recommended yearly flu vaccine and pneumonia vaccination at least every 5 years; moderate intensity exercise for up to 150 minutes weekly; and  sleep for at least 7 hours a day.  - I advised patient to maintain close follow up with Margarete Maeola DASEN, FNP for primary care needs. I encouraged her to speak with her PCP regarding left leg weakness (?back issue).    I spent  46  minutes in the care of the patient today including review of labs from CMP, Lipids, Thyroid  Function, Hematology (current and previous including abstractions from other facilities); face-to-face time discussing  her blood glucose readings/logs, discussing hypoglycemia and hyperglycemia episodes and symptoms, medications doses, her options of short and long term treatment based on the latest standards of care / guidelines;  discussion about incorporating lifestyle medicine;  and documenting the encounter. Risk reduction counseling performed per USPSTF guidelines to reduce obesity and cardiovascular risk factors.     Please refer to Patient Instructions for Blood Glucose Monitoring and Insulin /Medications Dosing Guide  in media tab for additional information. Please  also refer to  Patient Self Inventory in the Media  tab for reviewed elements of pertinent patient history.  Madeline Fox participated in the discussions, expressed understanding, and voiced agreement with the above plans.  All questions were answered to her satisfaction. she is encouraged to contact clinic should she have any questions or concerns prior to her return visit.    Follow up plan: - Return in about 2 months (around 11/13/2023) for Diabetes F/U with A1c in office, Thyroid  follow up, Previsit labs.  Benton Rio, [facility] [address] [place], [place] [address] Phone: [phone] Fax: [phone]  09/13/2023,  3:18 PM

## 2023-10-19 ENCOUNTER — Other Ambulatory Visit: Payer: Self-pay | Admitting: Nurse Practitioner

## 2023-10-28 ENCOUNTER — Telehealth: Payer: Self-pay

## 2023-10-28 NOTE — Telephone Encounter (Signed)
 Left a message requesting pt return call to the office.

## 2023-10-28 NOTE — Telephone Encounter (Signed)
 Yikes, have her back down to the 100 mcg again for safety purposes.  I can send in a refill for her if she needs it.

## 2023-10-28 NOTE — Telephone Encounter (Signed)
 Pt called stating her dose of Levothyroxine  was increased to 112mcg daily at her last office visit in August. States since starting the increased dose she has experienced a feeling of tightness in her throat. States she has been feeling jittery x 1wk. States she has not had any other changes in medications or medical history other than the increase to her Levothyroxine .

## 2023-10-29 NOTE — Telephone Encounter (Signed)
 Spoke with pt advising her to decrease her dose of Levothyroxine  to 100mcg daily per Benton Rio, FNP. Pt voiced understanding and stated she did have the 100mcg dose already.

## 2023-11-11 ENCOUNTER — Other Ambulatory Visit: Payer: Self-pay | Admitting: Nurse Practitioner

## 2023-11-11 LAB — TSH: TSH: 1.74 m[IU]/L (ref 0.40–4.50)

## 2023-11-15 ENCOUNTER — Other Ambulatory Visit: Payer: Self-pay | Admitting: Nurse Practitioner

## 2023-11-15 DIAGNOSIS — N1831 Chronic kidney disease, stage 3a: Secondary | ICD-10-CM

## 2023-11-18 ENCOUNTER — Encounter: Payer: Self-pay | Admitting: Nurse Practitioner

## 2023-11-18 ENCOUNTER — Ambulatory Visit: Admitting: Nurse Practitioner

## 2023-11-18 VITALS — BP 110/60 | HR 68 | Ht 63.0 in | Wt 180.6 lb

## 2023-11-18 DIAGNOSIS — E782 Mixed hyperlipidemia: Secondary | ICD-10-CM | POA: Diagnosis not present

## 2023-11-18 DIAGNOSIS — Z7984 Long term (current) use of oral hypoglycemic drugs: Secondary | ICD-10-CM

## 2023-11-18 DIAGNOSIS — E1122 Type 2 diabetes mellitus with diabetic chronic kidney disease: Secondary | ICD-10-CM | POA: Diagnosis not present

## 2023-11-18 DIAGNOSIS — E890 Postprocedural hypothyroidism: Secondary | ICD-10-CM | POA: Diagnosis not present

## 2023-11-18 DIAGNOSIS — I10 Essential (primary) hypertension: Secondary | ICD-10-CM

## 2023-11-18 DIAGNOSIS — N1831 Chronic kidney disease, stage 3a: Secondary | ICD-10-CM

## 2023-11-18 LAB — POCT GLYCOSYLATED HEMOGLOBIN (HGB A1C): Hemoglobin A1C: 7.8 % — AB (ref 4.0–5.6)

## 2023-11-18 MED ORDER — PIOGLITAZONE HCL 15 MG PO TABS: 15.0000 mg | ORAL_TABLET | Freq: Every day | ORAL | 3 refills | Status: AC

## 2023-11-18 NOTE — Progress Notes (Signed)
 11/18/2023, 3:45 PM                            Endocrinology Follow Up Visit  Subjective:    Patient ID: Madeline Fox, female    DOB: 04-07-1945.  Madeline Fox is being engaged in follow-up  for management of currently controlled symptomatic type 2 diabetes, postsurgical hypothyroidism, hyperlipidemia. PMD:   Madeline Maeola DASEN, FNP.   Past Medical History:  Diagnosis Date   Anemia    iron deficiency anemia   Anxiety    Arthritis    Breast cancer (HCC) 2007   LT LUMPECTOMY   Cancer (HCC) 2007   L BREAST CANCER - CHEMO/RADIATION /LUMPECTOMY     Chronic kidney disease    stage 3   Complication of anesthesia    Diabetes mellitus without complication (HCC)    Type 2   Diverticulosis of colon with hemorrhage    Goiter    Heart murmur    Hyperlipidemia    Hypertension    Hyperthyroidism    Hypothyroidism    s/p thyroid  ablation   Internal hemorrhoids    Neuromuscular disorder (HCC)    NEUROPATHY IN TOES   Nocturia    Ovarian carcinoma in situ    Personal history of chemotherapy    Personal history of radiation therapy    Pneumonia    Psoriasis    S/P chemotherapy, time since greater than 12 weeks 2007   BREAST CA   S/P radiation > 12 weeks 2007   BREAST CA   Thrombocytopenia    Wears dentures    partial upper   Past Surgical History:  Procedure Laterality Date   ABDOMINAL HYSTERECTOMY     APPENDECTOMY     BREAST BIOPSY Left 2007   POS   BREAST LUMPECTOMY     CATARACT EXTRACTION W/PHACO Right 09/29/2017   Procedure: CATARACT EXTRACTION PHACO AND INTRAOCULAR LENS PLACEMENT (IOC)  RIGHT DIABETIC;  Surgeon: Mittie Gaskin, MD;  Location: [facility];  Service: Ophthalmology;  Laterality: Right;  Diabetic - oral meds   CATARACT EXTRACTION W/PHACO Left 02/09/2018   Procedure: CATARACT EXTRACTION PHACO AND INTRAOCULAR LENS PLACEMENT (IOC) LEFT DIABETIC;  Surgeon:  Mittie Gaskin, MD;  Location: [facility];  Service: Ophthalmology;  Laterality: Left;   CHOLECYSTECTOMY     COLONOSCOPY WITH PROPOFOL  N/A 04/12/2015   Procedure: COLONOSCOPY WITH PROPOFOL ;  Surgeon: Deward CINDERELLA Piedmont, MD;  Location: [facility];  Service: Gastroenterology;  Laterality: N/A;   FINE NEEDLE ASPIRATION Left    left upper lobe thyroid  - benign   INTRAMEDULLARY (IM) NAIL INTERTROCHANTERIC Right 08/06/2020   Procedure: INTRAMEDULLARY (IM) NAIL INTERTROCHANTRIC;  Surgeon: Margrette Taft BRAVO, MD;  Location: [facility];  Service: Orthopedics;  Laterality: Right;   MASTECTOMY PARTIAL / LUMPECTOMY Left 2007   chemo and radiation   OOPHORECTOMY     for carcinoma in situ   THYROIDECTOMY  11/19/2011   Procedure: THYROIDECTOMY;  Surgeon: Krystal CHRISTELLA Spinner, MD;  Location: [facility];  Service: General;  Laterality: N/A;  Total Thyroidectomy   THYROIDECTOMY  Social History   Socioeconomic History   Marital status: Married    Spouse name: Not on file   Number of children: Not on file   Years of education: Not on file   Highest education level: Not on file  Occupational History   Not on file  Tobacco Use   Smoking status: Never   Smokeless tobacco: Never  Vaping Use   Vaping status: Never Used  Substance and Sexual Activity   Alcohol use: No   Drug use: No   Sexual activity: Not on file  Other Topics Concern   Not on file  Social History Narrative   Not on file   Social Drivers of Health   Financial Resource Strain: Not on file  Food Insecurity: Not on file  Transportation Needs: Not on file  Physical Activity: Not on file  Stress: Not on file  Social Connections: Not on file   Outpatient Encounter Medications as of 11/18/2023  Medication Sig   ACCU-CHEK GUIDE TEST test strip USE TO CHECK BLOOD GLUCOSE DAILY AS INSTRUCTED.   acetaminophen  (TYLENOL ) 325 MG tablet Take 2 tablets (650 mg total) by mouth every 6 (six) hours as needed for mild pain, fever or headache.    Alcohol Swabs (B-D SINGLE USE SWABS REGULAR) PADS USE TWICE DAILY   ALPRAZolam  (XANAX ) 0.25 MG tablet Take 0.25 mg by mouth daily as needed for anxiety.   Blood Glucose Monitoring Suppl (ACCU-CHEK GUIDE) w/Device KIT Use to test blood glucose daily as directed. E11.65   cetirizine (ZYRTEC) 10 MG tablet Take 10 mg by mouth at bedtime.   Cholecalciferol  (VITAMIN D3) 1000 units CAPS Take 1,000 Units by mouth daily.   enalapril  (VASOTEC ) 2.5 MG tablet TAKE 1 TABLET ONE TIME EACH DAY   ferrous sulfate  325 (65 FE) MG tablet Take 1 tablet (325 mg total) by mouth 2 (two) times daily with a meal. (Patient taking differently: Take 325 mg by mouth daily with breakfast.)   fluticasone  (FLONASE ) 50 MCG/ACT nasal spray Place 1 spray into both nostrils daily as needed for allergies.   JANUVIA  50 MG tablet TAKE ONE TABLET BY MOUTH DAILY   Lancets MISC Use to test blood glucose daily as directed. Accu-chek guide. E11.65   levothyroxine  (SYNTHROID ) 100 MCG tablet Take 100 mcg by mouth daily.   lovastatin (MEVACOR) 40 MG tablet Take 40 mg by mouth at bedtime.    Omega-3 Fatty Acids (FISH OIL) 1000 MG CAPS Take 1,000 mg by mouth daily.   omeprazole (PRILOSEC) 40 MG capsule Take 40 mg daily by mouth.   polyethylene glycol (MIRALAX ) 17 g packet Take 17 g by mouth 2 (two) times daily. (Patient taking differently: Take 17 g by mouth 2 (two) times daily. As needed)   Polyethylene Glycol 400 (BLINK TEARS OP) Apply 1 drop to eye daily as needed (dry eye).   triamcinolone  (KENALOG ) 0.025 % cream Apply 1 Application topically 2 (two) times daily.   vitamin B-12 (CYANOCOBALAMIN ) 1000 MCG tablet Take 1,000 mcg by mouth daily.   [DISCONTINUED] pioglitazone  (ACTOS ) 15 MG tablet Take 1 tablet (15 mg total) by mouth daily.   levothyroxine  (SYNTHROID ) 112 MCG tablet Take 1 tablet (112 mcg total) by mouth daily before breakfast. (Patient not taking: Reported on 11/18/2023)   pioglitazone  (ACTOS ) 15 MG tablet Take 1 tablet (15 mg  total) by mouth daily.   sodium chloride  1 g tablet Take 1 tablet (1 g total) by mouth daily. (Patient not taking: Reported on 11/18/2023)   No  facility-administered encounter medications on file as of 11/18/2023.    ALLERGIES: Allergies  Allergen Reactions   Amoxicillin-Pot Clavulanate Other (See Comments)    nervousness   Citalopram     Other reaction(s): drowsiness, decreased libido   Citalopram Hydrobromide Other (See Comments)   Clonazepam     Other reaction(s): drowsiness   Diazepam     Other reaction(s): drowsiness   Empagliflozin     Other reaction(s): weak, tired   Escitalopram     Other reaction(s): decreased libido   Sertraline     Other reaction(s): drowsiness   Sertraline Hcl Other (See Comments)    VACCINATION STATUS: There is no immunization history for the selected administration types on file for this patient.  Diabetes She presents for her follow-up diabetic visit. She has type 2 diabetes mellitus. Onset time: She was diagnosed at approximate age of 33 years. Denies any history of gestational diabetes. Her disease course has been stable. There are no hypoglycemic associated symptoms. Pertinent negatives for hypoglycemia include no confusion, pallor or seizures. There are no diabetic associated symptoms. Pertinent negatives for diabetes include no fatigue, no polydipsia, no polyphagia and no polyuria. There are no hypoglycemic complications. Symptoms are stable. Diabetic complications include nephropathy and peripheral neuropathy. Risk factors for coronary artery disease include diabetes mellitus, dyslipidemia, family history, hypertension, obesity, sedentary lifestyle and post-menopausal. Current diabetic treatment includes oral agent (dual therapy). She is compliant with treatment most of the time. Her weight is fluctuating minimally. She is following a generally healthy diet. When asked about meal planning, she reported none. She has not had a previous visit with a  dietitian. She never participates in exercise. Her home blood glucose trend is fluctuating minimally. Her breakfast blood glucose range is generally 140-180 mg/dl. (She presents today, accompanied by her daughter, with her logs showing slightly above target fasting glycemic profile.  Her POCT A1c today is 7.8%, unchanged from last visit.  She denies any s/s of hypoglycemia.  She eats fairly well, has even cut out her SF cookies.  She notes her nephrologist had mentioned using insulin  but she really prefers to stay away from injectables if possible.) An ACE inhibitor/angiotensin II receptor blocker is not being taken. She does not see a podiatrist.Eye exam is current.    Review of systems  Constitutional: + increasing body weight,  current Body mass index is 31.99 kg/m. , no fatigue, no subjective hyperthermia, no subjective hypothermia Eyes: no blurry vision, no xerophthalmia ENT: no sore throat, no nodules palpated in throat, no dysphagia/odynophagia, no hoarseness Cardiovascular: no chest pain, no shortness of breath, no palpitations, no leg swelling Respiratory: no cough, no shortness of breath Gastrointestinal: no nausea/vomiting/diarrhea Musculoskeletal: c/o bilateral knee pain (arthritis)- uses cane, + generalized muscle aches Skin: no rashes, no hyperemia Neurological: no tremors, no numbness, no tingling, no dizziness Psychiatric: no depression, no anxiety  Objective:    BP 110/60 (BP Location: Left Arm, Patient Position: Sitting, Cuff Size: Large)   Pulse 68   Ht 5' 3 (1.6 m)   Wt 180 lb 9.6 oz (81.9 kg)   BMI 31.99 kg/m   Wt Readings from Last 3 Encounters:  11/18/23 180 lb 9.6 oz (81.9 kg)  09/13/23 179 lb 9.6 oz (81.5 kg)  03/15/23 174 lb 12.8 oz (79.3 kg)    BP Readings from Last 3 Encounters:  11/18/23 110/60  09/13/23 124/60  03/15/23 (!) 140/60     Physical Exam- Limited  Constitutional:  Body mass index is 31.99 kg/m. ,  not in acute distress, normal state of  mind Respiratory: Adequate breathing efforts, no crackles, rales, rhonchi, or wheezing Musculoskeletal: no gross deformities, uses cane to ambulate Skin:  no rashes, no hyperemia,  Neurological: no tremor with outstretched hands   Diabetic Foot Exam - Simple   No data filed     Recent Results (from the past 2160 hours)  Comprehensive metabolic panel with GFR     Status: Abnormal   Collection Time: 09/08/23 10:52 AM  Result Value Ref Range   Glucose, Bld 175 (H) 65 - 99 mg/dL    Comment: .            Fasting reference interval . For someone without known diabetes, a glucose value >125 mg/dL indicates that they may have diabetes and this should be confirmed with a follow-up test. .    BUN 22 7 - 25 mg/dL   Creat 8.80 (H) 9.39 - 0.95 mg/dL   eGFR 46 (L) > OR = 60 mL/min/1.76m2   BUN/Creatinine Ratio 18 6 - 22 (calc)   Sodium 130 (L) 135 - 146 mmol/L   Potassium 5.0 3.5 - 5.3 mmol/L   Chloride 95 (L) 98 - 110 mmol/L   CO2 28 20 - 32 mmol/L   Calcium  9.4 8.6 - 10.4 mg/dL   Total Protein 7.2 6.1 - 8.1 g/dL   Albumin 4.3 3.6 - 5.1 g/dL   Globulin 2.9 1.9 - 3.7 g/dL (calc)   AG Ratio 1.5 1.0 - 2.5 (calc)   Total Bilirubin 0.4 0.2 - 1.2 mg/dL   Alkaline phosphatase (APISO) 74 37 - 153 U/L   AST 17 10 - 35 U/L   ALT 9 6 - 29 U/L  TSH     Status: Abnormal   Collection Time: 09/08/23 10:52 AM  Result Value Ref Range   TSH 4.91 (H) 0.40 - 4.50 mIU/L  T4, free     Status: None   Collection Time: 09/08/23 10:52 AM  Result Value Ref Range   Free T4 1.3 0.8 - 1.8 ng/dL  HgB J8r     Status: Abnormal   Collection Time: 09/13/23  2:47 PM  Result Value Ref Range   Hemoglobin A1C     HbA1c POC (<> result, manual entry)     HbA1c, POC (prediabetic range)     HbA1c, POC (controlled diabetic range)    TSH     Status: None   Collection Time: 11/11/23  9:15 AM  Result Value Ref Range   TSH 1.74 0.40 - 4.50 mIU/L  HgB A1c     Status: Abnormal   Collection Time: 11/18/23  3:23 PM   Result Value Ref Range   Hemoglobin A1C 7.8 (A) 4.0 - 5.6 %   HbA1c POC (<> result, manual entry)     HbA1c, POC (prediabetic range)     HbA1c, POC (controlled diabetic range)      Lipid Panel     Component Value Date/Time   CHOL 149 11/03/2017 1512   TRIG 86 11/03/2017 1512   HDL 53 11/03/2017 1512   CHOLHDL 2.8 11/03/2017 1512   LDLCALC 79 11/03/2017 1512     Latest Reference Range & Units 09/08/23 10:52 11/11/23 09:15  TSH 0.40 - 4.50 mIU/L 4.91 (H) 1.74  T4,Free(Direct) 0.8 - 1.8 ng/dL 1.3   (H): Data is abnormally high  Assessment & Plan:   1) 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  She presents today, accompanied by her daughter, with her logs showing slightly above target fasting glycemic profile.  Her POCT A1c today is 7.8%, unchanged from last visit.  She denies any s/s of hypoglycemia.  She eats fairly well, has even cut out her SF cookies.  She notes her nephrologist had mentioned using insulin  but she really prefers to stay away from injectables if possible.    -her diabetes is complicated by stage 3 renal insufficiency which is improving, obesity/sedentary life and Madeline Fox remains at a high risk for more acute and chronic complications which include CAD, CVA, CKD, retinopathy, and neuropathy. These are all discussed in detail with the patient.  - Nutritional counseling repeated/built upon at each appointment.  - The patient admits there is a room for improvement in their diet and drink choices. -  Suggestion is made for the patient to avoid simple carbohydrates from their diet including Cakes, Sweet Desserts / Pastries, Ice Cream, Soda (diet and regular), Sweet Tea, Candies, Chips, Cookies, Sweet Pastries, Store Bought Juices, Alcohol in Excess of 1-2 drinks a day, Artificial Sweeteners, Coffee  Creamer, and Sugar-free Products. This will help patient to have stable blood glucose profile and potentially avoid unintended weight gain.   - I encouraged the patient to switch to unprocessed or minimally processed complex starch and increased protein intake (animal or plant source), fruits, and vegetables.   - Patient is advised to stick to a routine mealtimes to eat 3 meals a day and avoid unnecessary snacks (to snack only to correct hypoglycemia).  - I have approached her with the following individualized plan to manage diabetes and patient agrees:   -She will not require any additional treatment given stable glycemic profile and A1c.  She is advised to continue Januvia  50 mg p.o. daily at breakfast and Actos  15 mg p.o. daily at breakfast.   Goal A1c for her due to age and co-morbidities would be less than 8%.  She did bring in patient assistance form for her Januvia , due to be renewed in November.  We did fill out our portion and gave back to patient.  - Patient specific target  A1c;  LDL, HDL, Triglycerides, and  Waist Circumference were discussed in detail.  2) BP/HTN: Her blood pressure is controlled to target.  She is advised to continue meds as prescribed by PCP/nephrology.  3) Lipids/HPL:  She does not have recent lipid panel to review.  She is advised to continue Lovastatin 40 mg po daily at bedtime.  Side effects and precautions discussed with her.  4)  Weight/Diet:  Her Body mass index is 31.99 kg/m.-- not a candidate for major weight loss.  CDE Consult has been  initiated , exercise, and detailed carbohydrates information provided.  5) Postsurgical hypothyroidism - She underwent total thyroidectomy for large multinodular goiter in 2013.    -Her previsit TFTs are consistent with appropriate hormone replacement.  She is advised to continue Levothyroxine  100 mcg po daily before breakfast.  We did go over S/s of over-replacement to watch for.  She did not tolerate the 112 mcg  dose previously.   - We discussed about the correct intake of her thyroid  hormone, on empty stomach at fasting, with water, separated by at least 30 minutes from breakfast and other medications,  and separated by more than 4 hours from calcium , iron, multivitamins, acid reflux medications (PPIs). -Patient is made aware of the  fact that thyroid  hormone replacement is needed for life, dose to be adjusted by periodic monitoring of thyroid  function tests.  6) Chronic Care/Health Maintenance: -she is on ACE and on Statin medications and  is encouraged to continue to follow up with Ophthalmology, Dentist,  Podiatrist at least yearly or according to recommendations, and advised to stay away from smoking. I have recommended yearly flu vaccine and pneumonia vaccination at least every 5 years; moderate intensity exercise for up to 150 minutes weekly; and  sleep for at least 7 hours a day.  - I advised patient to maintain close follow up with Madeline Maeola DASEN, FNP for primary care needs.     I spent  46  minutes in the care of the patient today including review of labs from CMP, Lipids, Thyroid  Function, Hematology (current and previous including abstractions from other facilities); face-to-face time discussing  her blood glucose readings/logs, discussing hypoglycemia and hyperglycemia episodes and symptoms, medications doses, her options of short and long term treatment based on the latest standards of care / guidelines;  discussion about incorporating lifestyle medicine;  and documenting the encounter. Risk reduction counseling performed per USPSTF guidelines to reduce obesity and cardiovascular risk factors.     Please refer to Patient Instructions for Blood Glucose Monitoring and Insulin /Medications Dosing Guide  in media tab for additional information. Please  also refer to  Patient Self Inventory in the Media  tab for reviewed elements of pertinent patient history.  Madeline Fox participated in the  discussions, expressed understanding, and voiced agreement with the above plans.  All questions were answered to her satisfaction. she is encouraged to contact clinic should she have any questions or concerns prior to her return visit.    Follow up plan: - Return in about 4 months (around 03/20/2024) for Diabetes F/U with A1c in office, No previsit labs, Bring meter and logs.  Benton Rio, [facility] [address] [place], [place] [address] Phone: [phone] Fax: [phone]  11/18/2023, 3:45 PM

## 2024-01-02 ENCOUNTER — Other Ambulatory Visit: Payer: Self-pay | Admitting: Nurse Practitioner

## 2024-01-14 ENCOUNTER — Inpatient Hospital Stay

## 2024-01-14 ENCOUNTER — Encounter: Payer: Self-pay | Admitting: Oncology

## 2024-01-14 ENCOUNTER — Inpatient Hospital Stay: Attending: Oncology | Admitting: Oncology

## 2024-01-14 VITALS — BP 164/68 | HR 76 | Temp 98.1°F | Resp 18 | Wt 180.1 lb

## 2024-01-14 DIAGNOSIS — D649 Anemia, unspecified: Secondary | ICD-10-CM

## 2024-01-14 DIAGNOSIS — E039 Hypothyroidism, unspecified: Secondary | ICD-10-CM | POA: Insufficient documentation

## 2024-01-14 DIAGNOSIS — Z86008 Personal history of in-situ neoplasm of other site: Secondary | ICD-10-CM | POA: Insufficient documentation

## 2024-01-14 DIAGNOSIS — Z880 Allergy status to penicillin: Secondary | ICD-10-CM | POA: Insufficient documentation

## 2024-01-14 DIAGNOSIS — Z90710 Acquired absence of both cervix and uterus: Secondary | ICD-10-CM | POA: Diagnosis not present

## 2024-01-14 DIAGNOSIS — Z853 Personal history of malignant neoplasm of breast: Secondary | ICD-10-CM | POA: Diagnosis not present

## 2024-01-14 DIAGNOSIS — Z79890 Hormone replacement therapy (postmenopausal): Secondary | ICD-10-CM | POA: Insufficient documentation

## 2024-01-14 DIAGNOSIS — Z90721 Acquired absence of ovaries, unilateral: Secondary | ICD-10-CM | POA: Diagnosis not present

## 2024-01-14 DIAGNOSIS — Z803 Family history of malignant neoplasm of breast: Secondary | ICD-10-CM | POA: Diagnosis not present

## 2024-01-14 DIAGNOSIS — Z86002 Personal history of in-situ neoplasm of other and unspecified genital organs: Secondary | ICD-10-CM | POA: Insufficient documentation

## 2024-01-14 DIAGNOSIS — Z9221 Personal history of antineoplastic chemotherapy: Secondary | ICD-10-CM | POA: Insufficient documentation

## 2024-01-14 DIAGNOSIS — Z9012 Acquired absence of left breast and nipple: Secondary | ICD-10-CM | POA: Insufficient documentation

## 2024-01-14 DIAGNOSIS — Z7984 Long term (current) use of oral hypoglycemic drugs: Secondary | ICD-10-CM | POA: Diagnosis not present

## 2024-01-14 DIAGNOSIS — E1122 Type 2 diabetes mellitus with diabetic chronic kidney disease: Secondary | ICD-10-CM | POA: Diagnosis not present

## 2024-01-14 DIAGNOSIS — Z79899 Other long term (current) drug therapy: Secondary | ICD-10-CM | POA: Diagnosis not present

## 2024-01-14 DIAGNOSIS — Z9049 Acquired absence of other specified parts of digestive tract: Secondary | ICD-10-CM | POA: Insufficient documentation

## 2024-01-14 DIAGNOSIS — F418 Other specified anxiety disorders: Secondary | ICD-10-CM | POA: Diagnosis not present

## 2024-01-14 DIAGNOSIS — I129 Hypertensive chronic kidney disease with stage 1 through stage 4 chronic kidney disease, or unspecified chronic kidney disease: Secondary | ICD-10-CM | POA: Diagnosis not present

## 2024-01-14 DIAGNOSIS — Z8249 Family history of ischemic heart disease and other diseases of the circulatory system: Secondary | ICD-10-CM | POA: Insufficient documentation

## 2024-01-14 DIAGNOSIS — Z8701 Personal history of pneumonia (recurrent): Secondary | ICD-10-CM | POA: Insufficient documentation

## 2024-01-14 DIAGNOSIS — N1831 Chronic kidney disease, stage 3a: Secondary | ICD-10-CM | POA: Insufficient documentation

## 2024-01-14 DIAGNOSIS — E785 Hyperlipidemia, unspecified: Secondary | ICD-10-CM | POA: Insufficient documentation

## 2024-01-14 DIAGNOSIS — Z923 Personal history of irradiation: Secondary | ICD-10-CM | POA: Diagnosis not present

## 2024-01-14 LAB — CBC WITH DIFFERENTIAL/PLATELET
Abs Immature Granulocytes: 0.02 K/uL (ref 0.00–0.07)
Basophils Absolute: 0 K/uL (ref 0.0–0.1)
Basophils Relative: 0 %
Eosinophils Absolute: 0.1 K/uL (ref 0.0–0.5)
Eosinophils Relative: 2 %
HCT: 31.7 % — ABNORMAL LOW (ref 36.0–46.0)
Hemoglobin: 10.2 g/dL — ABNORMAL LOW (ref 12.0–15.0)
Immature Granulocytes: 0 %
Lymphocytes Relative: 18 %
Lymphs Abs: 1.2 K/uL (ref 0.7–4.0)
MCH: 31.5 pg (ref 26.0–34.0)
MCHC: 32.2 g/dL (ref 30.0–36.0)
MCV: 97.8 fL (ref 80.0–100.0)
Monocytes Absolute: 0.5 K/uL (ref 0.1–1.0)
Monocytes Relative: 7 %
Neutro Abs: 4.7 K/uL (ref 1.7–7.7)
Neutrophils Relative %: 73 %
Platelets: 164 K/uL (ref 150–400)
RBC: 3.24 MIL/uL — ABNORMAL LOW (ref 3.87–5.11)
RDW: 13.1 % (ref 11.5–15.5)
WBC: 6.5 K/uL (ref 4.0–10.5)
nRBC: 0 % (ref 0.0–0.2)

## 2024-01-14 LAB — FOLATE: Folate: >20 ng/mL

## 2024-01-14 LAB — COMPREHENSIVE METABOLIC PANEL WITH GFR
ALT: 15 U/L (ref 0–44)
AST: 24 U/L (ref 15–41)
Albumin: 4.3 g/dL (ref 3.5–5.0)
Alkaline Phosphatase: 84 U/L (ref 38–126)
Anion gap: 8 (ref 5–15)
BUN: 24 mg/dL — ABNORMAL HIGH (ref 8–23)
CO2: 29 mmol/L (ref 22–32)
Calcium: 9.2 mg/dL (ref 8.9–10.3)
Chloride: 99 mmol/L (ref 98–111)
Creatinine, Ser: 1.27 mg/dL — ABNORMAL HIGH (ref 0.44–1.00)
GFR, Estimated: 43 mL/min — ABNORMAL LOW
Glucose, Bld: 200 mg/dL — ABNORMAL HIGH (ref 70–99)
Potassium: 4.8 mmol/L (ref 3.5–5.1)
Sodium: 136 mmol/L (ref 135–145)
Total Bilirubin: 0.4 mg/dL (ref 0.0–1.2)
Total Protein: 7.2 g/dL (ref 6.5–8.1)

## 2024-01-14 LAB — RETICULOCYTES
Immature Retic Fract: 8 % (ref 2.3–15.9)
RBC.: 3.23 MIL/uL — ABNORMAL LOW (ref 3.87–5.11)
Retic Count, Absolute: 50.1 K/uL (ref 19.0–186.0)
Retic Ct Pct: 1.6 % (ref 0.4–3.1)

## 2024-01-14 LAB — IRON AND TIBC
Iron: 55 ug/dL (ref 28–170)
Saturation Ratios: 25 % (ref 10.4–31.8)
TIBC: 223 ug/dL — ABNORMAL LOW (ref 250–450)
UIBC: 168 ug/dL

## 2024-01-14 LAB — FERRITIN: Ferritin: 693 ng/mL — ABNORMAL HIGH (ref 11–307)

## 2024-01-14 LAB — VITAMIN B12: Vitamin B-12: 1716 pg/mL — ABNORMAL HIGH (ref 180–914)

## 2024-01-14 LAB — LACTATE DEHYDROGENASE: LDH: 204 U/L (ref 105–235)

## 2024-01-14 NOTE — Progress Notes (Signed)
 "  [facility]  [phone] [phone]    INITIAL HEMATOLOGY CONSULTATION  Referring Provider: Dr. Gaynell Clos   Reason for Referral: Anemia  HPI: [name] is an 78 year old female with a past medical history significant for hypertension, chronic kidney disease, diabetes mellitus, hyperlipidemia, history of breast cancer in 2007 treated with lumpectomy/chemotherapy/radiation.  She has been referred to hematology for evaluation of anemia.  She had recent lab work performed on 12/14/2023 which showed a creatinine of 0.95, WBC 4.9, hemoglobin 9.6, MCV 96.5, platelets 194,000.  Review of prior labs available to me in epic show that the patient has been anemic since at least 2013.  The patient presents to the office today accompanied by her granddaughter.  She reports mild fatigue and the occasional headache but otherwise has been feeling well.  She is not having any fevers, chills, night sweats.  Denies chest pain, shortness of breath, abdominal pain, nausea, vomiting.  No bleeding reported.  She has peripheral neuropathy to her fingertips and feet.  She notices some mild swelling in her lower extremities.  She reports that she has been taking iron supplementation as well as B12 supplementation for quite some time.  She reports a history of seeing hematology many years ago and then was subsequently diagnosed with breast cancer and underwent left lumpectomy and received chemotherapy and radiation.  The patient reports that she received 4 cycles of what sounds like Adriamycin and Cytoxan.  She believes that she received a blood transfusion while receiving chemotherapy.  Past Medical History:  Diagnosis Date   Anemia    iron deficiency anemia   Anxiety    Arthritis    Breast cancer (HCC) 2007   LT LUMPECTOMY   Cancer (HCC) 2007   L BREAST CANCER - CHEMO/RADIATION /LUMPECTOMY     Chronic kidney disease    stage 3   Complication of anesthesia     Diabetes mellitus without complication (HCC)    Type 2   Diverticulosis of colon with hemorrhage    Goiter    Heart murmur    Hyperlipidemia    Hypertension    Hyperthyroidism    Hypothyroidism    s/p thyroid  ablation   Internal hemorrhoids    Neuromuscular disorder (HCC)    NEUROPATHY IN TOES   Nocturia    Ovarian carcinoma in situ    Personal history of chemotherapy    Personal history of radiation therapy    Pneumonia    Psoriasis    S/P chemotherapy, time since greater than 12 weeks 2007   BREAST CA   S/P radiation > 12 weeks 2007   BREAST CA   Thrombocytopenia    Wears dentures    partial upper  :  Past Surgical History:  Procedure Laterality Date   ABDOMINAL HYSTERECTOMY     APPENDECTOMY     BREAST BIOPSY Left 2007   POS   BREAST LUMPECTOMY     CATARACT EXTRACTION W/PHACO Right 09/29/2017   Procedure: CATARACT EXTRACTION PHACO AND INTRAOCULAR LENS PLACEMENT (IOC)  RIGHT DIABETIC;  Surgeon: Mittie Gaskin, MD;  Location: [facility];  Service: Ophthalmology;  Laterality: Right;  Diabetic - oral meds   CATARACT EXTRACTION W/PHACO Left 02/09/2018   Procedure: CATARACT EXTRACTION PHACO AND INTRAOCULAR LENS PLACEMENT (IOC) LEFT DIABETIC;  Surgeon: Mittie Gaskin, MD;  Location: [facility];  Service: Ophthalmology;  Laterality: Left;   CHOLECYSTECTOMY     COLONOSCOPY WITH PROPOFOL  N/A 04/12/2015   Procedure: COLONOSCOPY WITH PROPOFOL ;  Surgeon: Deward CINDERELLA Piedmont, MD;  Location: [facility];  Service: Gastroenterology;  Laterality: N/A;   FINE NEEDLE ASPIRATION Left    left upper lobe thyroid  - benign   INTRAMEDULLARY (IM) NAIL INTERTROCHANTERIC Right 08/06/2020   Procedure: INTRAMEDULLARY (IM) NAIL INTERTROCHANTRIC;  Surgeon: Margrette Taft BRAVO, MD;  Location: [facility];  Service: Orthopedics;  Laterality: Right;   MASTECTOMY PARTIAL / LUMPECTOMY Left 2007   chemo and radiation   OOPHORECTOMY     for carcinoma in situ   THYROIDECTOMY  11/19/2011    Procedure: THYROIDECTOMY;  Surgeon: Krystal CHRISTELLA Spinner, MD;  Location: [facility];  Service: General;  Laterality: N/A;  Total Thyroidectomy   THYROIDECTOMY    :  CURRENT MEDS: Current Outpatient Medications  Medication Sig Dispense Refill   guaifenesin (HUMIBID E) 400 MG TABS tablet 1 tablet as needed Orally every 4 hrs; Duration: 5 days     ACCU-CHEK GUIDE TEST test strip USE TO CHECK BLOOD GLUCOSE DAILY AS INSTRUCTED. 100 strip 3   acetaminophen  (TYLENOL ) 325 MG tablet Take 2 tablets (650 mg total) by mouth every 6 (six) hours as needed for mild pain, fever or headache. 20 tablet 2   Alcohol Swabs (B-D SINGLE USE SWABS REGULAR) PADS USE TWICE DAILY 200 each 2   ALPRAZolam  (XANAX ) 0.25 MG tablet Take 0.25 mg by mouth daily as needed for anxiety.     Blood Glucose Monitoring Suppl (ACCU-CHEK GUIDE) w/Device KIT Use to test blood glucose daily as directed. E11.65 1 kit 0   cetirizine (ZYRTEC) 10 MG tablet Take 10 mg by mouth at bedtime.     Cholecalciferol  (VITAMIN D3) 1000 units CAPS Take 1,000 Units by mouth daily.     enalapril  (VASOTEC ) 2.5 MG tablet TAKE 1 TABLET ONE TIME EACH DAY     ferrous sulfate  325 (65 FE) MG tablet Take 1 tablet (325 mg total) by mouth 2 (two) times daily with a meal. (Patient taking differently: Take 325 mg by mouth daily with breakfast.) 60 tablet 5   fluticasone  (FLONASE ) 50 MCG/ACT nasal spray Place 1 spray into both nostrils daily as needed for allergies.     furosemide (LASIX) 20 MG tablet      JANUVIA  50 MG tablet TAKE ONE TABLET BY MOUTH DAILY 90 tablet 2   Lancets MISC Use to test blood glucose daily as directed. Accu-chek guide. E11.65 100 each 2   levothyroxine  (SYNTHROID ) 100 MCG tablet TAKE 1 TABLET (100 MCG TOTAL) BY MOUTH DAILY BEFORE BREAKFAST. 90 tablet 3   levothyroxine  (SYNTHROID ) 112 MCG tablet Take 1 tablet (112 mcg total) by mouth daily before breakfast. (Patient not taking: Reported on 11/18/2023) 90 tablet 1   lovastatin (MEVACOR) 40 MG tablet Take  40 mg by mouth at bedtime.      Omega-3 Fatty Acids (FISH OIL) 1000 MG CAPS Take 1,000 mg by mouth daily.     omeprazole (PRILOSEC) 40 MG capsule Take 40 mg daily by mouth.     pioglitazone  (ACTOS ) 15 MG tablet Take 1 tablet (15 mg total) by mouth daily. 90 tablet 3   polyethylene glycol (MIRALAX ) 17 g packet Take 17 g by mouth 2 (two) times daily. (Patient taking differently: Take 17 g by mouth 2 (two) times daily. As needed) 60 each 3   Polyethylene Glycol 400 (BLINK TEARS OP) Apply 1 drop to eye daily as needed (dry eye).     sodium chloride  1 g tablet Take 1 tablet (1 g total) by mouth daily. (Patient not taking:  Reported on 11/18/2023) 30 tablet 1   triamcinolone  (KENALOG ) 0.025 % cream Apply 1 Application topically 2 (two) times daily. 30 g 0   vitamin B-12 (CYANOCOBALAMIN ) 1000 MCG tablet Take 1,000 mcg by mouth daily.     No current facility-administered medications for this visit.    Allergies[1]:   Family History  Problem Relation Age of Onset   Cancer Mother    Diabetes Mother    Heart disease Father    Cancer Sister    Cancer Brother    Breast cancer Cousin   :  Social History   Socioeconomic History   Marital status: Married    Spouse name: Not on file   Number of children: 6   Years of education: Not on file   Highest education level: Not on file  Occupational History   Not on file  Tobacco Use   Smoking status: Never   Smokeless tobacco: Never  Vaping Use   Vaping status: Never Used  Substance and Sexual Activity   Alcohol use: No   Drug use: No   Sexual activity: Not on file  Other Topics Concern   Not on file  Social History Narrative   Not on file   Social Drivers of Health   Tobacco Use: Low Risk (01/14/2024)   Patient History    Smoking Tobacco Use: Never    Smokeless Tobacco Use: Never    Passive Exposure: Not on file  Financial Resource Strain: Not on file  Food Insecurity: No Food Insecurity (01/14/2024)   Epic    Worried About  Programme Researcher, Broadcasting/film/video in the Last Year: Never true    Ran Out of Food in the Last Year: Never true  Transportation Needs: No Transportation Needs (01/14/2024)   Epic    Lack of Transportation (Medical): No    Lack of Transportation (Non-Medical): No  Physical Activity: Not on file  Stress: Not on file  Social Connections: Not on file  Intimate Partner Violence: Not At Risk (01/14/2024)   Epic    Fear of Current or Ex-Partner: No    Emotionally Abused: No    Physically Abused: No    Sexually Abused: No  Depression (PHQ2-9): Low Risk (01/14/2024)   Depression (PHQ2-9)    PHQ-2 Score: 0  Alcohol Screen: Not on file  Housing: Low Risk (01/14/2024)   Epic    Unable to Pay for Housing in the Last Year: No    Number of Times Moved in the Last Year: 0    Homeless in the Last Year: No  Utilities: Not At Risk (01/14/2024)   Epic    Threatened with loss of utilities: No  Health Literacy: Not on file  :  REVIEW OF SYSTEMS:  A comprehensive 14 point review of systems was negative except as noted in the HPI.    Exam: BP (!) 164/68 (BP Location: Left Arm, Patient Position: Sitting)   Pulse 76   Temp 98.1 F (36.7 C) (Oral)   Resp 18   Wt 180 lb 1.9 oz (81.7 kg)   SpO2 100%   BMI 31.91 kg/m    Physical Exam Vitals reviewed.  Constitutional:      General: She is not in acute distress. HENT:     Head: Normocephalic.  Eyes:     General: No scleral icterus.    Conjunctiva/sclera: Conjunctivae normal.  Cardiovascular:     Rate and Rhythm: Normal rate and regular rhythm.  Pulmonary:     Effort: Pulmonary effort  is normal. No respiratory distress.     Breath sounds: Normal breath sounds.  Abdominal:     General: Bowel sounds are normal. There is no distension.     Palpations: Abdomen is soft.  Musculoskeletal:        General: Normal range of motion.     Comments: Trace lower extremity edema  Lymphadenopathy:     Cervical: No cervical adenopathy.  Skin:    General: Skin is  warm and dry.  Neurological:     Mental Status: She is alert and oriented to person, place, and time.  Psychiatric:        Mood and Affect: Mood normal.        Behavior: Behavior normal.        Thought Content: Thought content normal.        Judgment: Judgment normal.     LABS:  Lab Results  Component Value Date   WBC 8.4 08/08/2020   HGB 7.9 (L) 08/08/2020   HCT 24.5 (L) 08/08/2020   PLT 145 (L) 08/08/2020   GLUCOSE 175 (H) 09/08/2023   CHOL 149 11/03/2017   TRIG 86 11/03/2017   HDL 53 11/03/2017   LDLCALC 79 11/03/2017   ALT 9 09/08/2023   AST 17 09/08/2023   NA 130 (L) 09/08/2023   K 5.0 09/08/2023   CL 95 (L) 09/08/2023   CREATININE 1.19 (H) 09/08/2023   BUN 22 09/08/2023   CO2 28 09/08/2023   INR 1.2 08/07/2020   HGBA1C 7.8 (A) 11/18/2023   MICROALBUR 10 01/04/2020    No results found.   ASSESSMENT AND PLAN:  1.  Normocytic anemia. The patient has a longstanding history of anemia.  Today we discussed potential causes of anemia including acute blood loss, chronic kidney disease, liver disease, medications, chronic inflammation, hemolysis, autoimmune, nutritional deficiencies, malignancy.  Today, we will perform additional workup including a repeat CBC, CMP, iron studies, vitamin B12, folate, methylmalonic acid, copper levels.  Will also check hemolysis markers including reticulocytes, LDH, haptoglobin.  Will also obtain serum protein electrophoresis with immunofixation, quantitative immunoglobulins, light chains.  The patient has stage IIIa chronic kidney disease secondary to her diabetes mellitus and suspect this could be a contributor.  Also need to keep in mind that the patient has received chemotherapy in the past and is at risk for development of myelodysplastic syndrome.  We will make further recommendations pending today's workup.  2.  Stage IIIa chronic kidney disease. Due to her diabetes and possibly a component of hypertension.  She is currently being  followed by nephrology.  3.  History of breast cancer. The patient was treated in 2007 with left lumpectomy followed by chemotherapy and radiation.  [facility] was at [facility].  Full records are unavailable to me.  The patient seems to recall receiving 4 cycles of chemotherapy which sounds like Adriamycin and Cytoxan.  She is up-to-date with her mammogram.  Monitor.  Follow-up: In about 4 weeks to review the results of today's workup and make further recommendations.  Thank you for this referral.  Madeline Pajak, DNP, AGPCNP-BC, AOCNP      [1]  Allergies Allergen Reactions   Amoxicillin-Pot Clavulanate Other (See Comments)    nervousness   Citalopram     Other reaction(s): drowsiness, decreased libido   Citalopram Hydrobromide Other (See Comments)   Clonazepam     Other reaction(s): drowsiness   Diazepam     Other reaction(s): drowsiness   Empagliflozin     Other  reaction(s): weak, tired   Escitalopram     Other reaction(s): decreased libido   Sertraline     Other reaction(s): drowsiness   Sertraline Hcl Other (See Comments)   "

## 2024-01-15 LAB — HAPTOGLOBIN: Haptoglobin: 110 mg/dL (ref 42–346)

## 2024-01-17 LAB — KAPPA/LAMBDA LIGHT CHAINS
Kappa free light chain: 44.5 mg/L — ABNORMAL HIGH (ref 3.3–19.4)
Kappa, lambda light chain ratio: 1.47 (ref 0.26–1.65)
Lambda free light chains: 30.2 mg/L — ABNORMAL HIGH (ref 5.7–26.3)

## 2024-01-17 LAB — COPPER, SERUM: Copper: 87 ug/dL (ref 80–158)

## 2024-01-18 LAB — MULTIPLE MYELOMA PANEL, SERUM
Albumin SerPl Elph-Mcnc: 3.7 g/dL (ref 2.9–4.4)
Albumin/Glob SerPl: 1.3 (ref 0.7–1.7)
Alpha 1: 0.2 g/dL (ref 0.0–0.4)
Alpha2 Glob SerPl Elph-Mcnc: 0.7 g/dL (ref 0.4–1.0)
B-Globulin SerPl Elph-Mcnc: 0.9 g/dL (ref 0.7–1.3)
Gamma Glob SerPl Elph-Mcnc: 1.2 g/dL (ref 0.4–1.8)
Globulin, Total: 3 g/dL (ref 2.2–3.9)
IgA: 339 mg/dL (ref 64–422)
IgG (Immunoglobin G), Serum: 1178 mg/dL (ref 586–1602)
IgM (Immunoglobulin M), Srm: 208 mg/dL (ref 26–217)
Total Protein ELP: 6.7 g/dL (ref 6.0–8.5)

## 2024-01-19 LAB — METHYLMALONIC ACID, SERUM: Methylmalonic Acid, Quantitative: 250 nmol/L (ref 0–378)

## 2024-02-08 ENCOUNTER — Other Ambulatory Visit: Payer: Self-pay

## 2024-02-08 DIAGNOSIS — N1831 Chronic kidney disease, stage 3a: Secondary | ICD-10-CM

## 2024-02-08 MED ORDER — ACCU-CHEK SOFTCLIX LANCETS MISC: 1 refills | Status: AC

## 2024-02-08 MED ORDER — ACCU-CHEK GUIDE W/DEVICE KIT: PACK | 0 refills | Status: AC

## 2024-02-08 MED ORDER — ACCU-CHEK GUIDE TEST VI STRP: ORAL_STRIP | 3 refills | Status: AC

## 2024-02-10 ENCOUNTER — Telehealth: Payer: Self-pay | Admitting: "Endocrinology

## 2024-02-10 NOTE — Telephone Encounter (Signed)
 Pt called with high BG readings.   Date Before breakfast Before lunch Before supper Bedtime  02/07/2024  198 255   02/08/2024 255  250   02/09/2024  224 244   02/09/2021 189 250  199     Pt taking: Januvia  50 mg daily , Actos  15 mg daily. Patient states that her sugars have been running high. These are just a few readings as she has a new phone and cannot see what the dates are. She mentions that her PCP told her to only drink 2 bottles of water, she had been drinking 5 bottles a day. She drinks 1 cup of coffee a day. The reason the PCP stopped her from this was because her Sodium was low.

## 2024-02-10 NOTE — Telephone Encounter (Signed)
 Pt left a VM on Joy's line that she is having high readings. Can you call her to obtain and forward to Whitney?

## 2024-02-11 MED ORDER — GLIPIZIDE ER 2.5 MG PO TB24: 2.5000 mg | ORAL_TABLET | Freq: Every day | ORAL | 1 refills | Status: AC

## 2024-02-11 NOTE — Telephone Encounter (Signed)
 We can add on a tiny baby dose of Glipizide  to help her get back on track.  Like 2.5 mg XL daily.  Last thing we want is to drop her too low.  This medication can cause hypoglycemia so she needs to let us  know if she experiences any.  Is she ok with trying this?  This may not need to be a long-term medication, just something to help her reset (hopefully).

## 2024-02-11 NOTE — Telephone Encounter (Signed)
 done

## 2024-02-11 NOTE — Telephone Encounter (Signed)
 Patient is in agreement to take the Glipizide . She ask that it be sent to the [facility].

## 2024-02-14 ENCOUNTER — Inpatient Hospital Stay: Attending: Physician Assistant | Admitting: Physician Assistant

## 2024-02-14 VITALS — BP 131/52 | HR 65 | Temp 98.1°F | Resp 18 | Wt 178.6 lb

## 2024-02-14 DIAGNOSIS — N1831 Chronic kidney disease, stage 3a: Secondary | ICD-10-CM | POA: Insufficient documentation

## 2024-02-14 DIAGNOSIS — E1122 Type 2 diabetes mellitus with diabetic chronic kidney disease: Secondary | ICD-10-CM | POA: Diagnosis not present

## 2024-02-14 DIAGNOSIS — Z9012 Acquired absence of left breast and nipple: Secondary | ICD-10-CM | POA: Insufficient documentation

## 2024-02-14 DIAGNOSIS — Z9221 Personal history of antineoplastic chemotherapy: Secondary | ICD-10-CM | POA: Diagnosis not present

## 2024-02-14 DIAGNOSIS — D649 Anemia, unspecified: Secondary | ICD-10-CM | POA: Diagnosis not present

## 2024-02-14 DIAGNOSIS — Z923 Personal history of irradiation: Secondary | ICD-10-CM | POA: Insufficient documentation

## 2024-02-14 DIAGNOSIS — I129 Hypertensive chronic kidney disease with stage 1 through stage 4 chronic kidney disease, or unspecified chronic kidney disease: Secondary | ICD-10-CM | POA: Insufficient documentation

## 2024-02-14 DIAGNOSIS — Z853 Personal history of malignant neoplasm of breast: Secondary | ICD-10-CM | POA: Diagnosis not present

## 2024-02-14 NOTE — Progress Notes (Signed)
 " [facility]  PROGRESS NOTE  Patient Care Team: Margarete Maeola DASEN, FNP as PCP - General (Nurse Practitioner)  CHIEF COMPLAINTS/PURPOSE OF CONSULTATION:  Normocytic anemia   INTERVAL HISTROY:  Madeline Fox 79 y.o. female returns for a follow up for continued management of normocytic anemia. She is accompanied by her daughter for this visit.   On exam today, Madeline Fox reports her energy levels are overall stable.  She does have some days where she is more fatigued than others but contributes this to her age and other chronic conditions.  She is able to complete her ADLs on her own.  She reports her appetite and weight are overall stable.  She has chronic arthritic pain in her legs that is unchanged and she rates it as 6 out of 10 on a pain scale.  She denies nausea, vomiting or abdominal pain.  Her bowel habits are unchanged without recurrent episodes of diarrhea or constipation.  She reports she recently started glipizide  with mild improvement of her blood glucose levels from 200s to 160-170s.  She denies fevers, chills, night sweats, shortness of breath, chest pain or cough.  She has no other complaints.  Rest of the 10 point ROS as below.  MEDICAL HISTORY:  Past Medical History:  Diagnosis Date   Anemia    iron deficiency anemia   Anxiety    Arthritis    Breast cancer (HCC) 2007   LT LUMPECTOMY   Cancer (HCC) 2007   L BREAST CANCER - CHEMO/RADIATION /LUMPECTOMY     Chronic kidney disease    stage 3   Complication of anesthesia    Diabetes mellitus without complication (HCC)    Type 2   Diverticulosis of colon with hemorrhage    Goiter    Heart murmur    Hyperlipidemia    Hypertension    Hyperthyroidism    Hypothyroidism    s/p thyroid  ablation   Internal hemorrhoids    Neuromuscular disorder (HCC)    NEUROPATHY IN TOES   Nocturia    Ovarian carcinoma in situ    Personal history of chemotherapy    Personal history of radiation therapy    Pneumonia     Psoriasis    S/P chemotherapy, time since greater than 12 weeks 2007   BREAST CA   S/P radiation > 12 weeks 2007   BREAST CA   Thrombocytopenia    Wears dentures    partial upper    SURGICAL HISTORY: Past Surgical History:  Procedure Laterality Date   ABDOMINAL HYSTERECTOMY     APPENDECTOMY     BREAST BIOPSY Left 2007   POS   BREAST LUMPECTOMY     CATARACT EXTRACTION W/PHACO Right 09/29/2017   Procedure: CATARACT EXTRACTION PHACO AND INTRAOCULAR LENS PLACEMENT (IOC)  RIGHT DIABETIC;  Surgeon: Mittie Gaskin, MD;  Location: [facility];  Service: Ophthalmology;  Laterality: Right;  Diabetic - oral meds   CATARACT EXTRACTION W/PHACO Left 02/09/2018   Procedure: CATARACT EXTRACTION PHACO AND INTRAOCULAR LENS PLACEMENT (IOC) LEFT DIABETIC;  Surgeon: Mittie Gaskin, MD;  Location: [facility];  Service: Ophthalmology;  Laterality: Left;   CHOLECYSTECTOMY     COLONOSCOPY WITH PROPOFOL  N/A 04/12/2015   Procedure: COLONOSCOPY WITH PROPOFOL ;  Surgeon: Deward CINDERELLA Piedmont, MD;  Location: [facility];  Service: Gastroenterology;  Laterality: N/A;   FINE NEEDLE ASPIRATION Left    left upper lobe thyroid  - benign   INTRAMEDULLARY (IM) NAIL INTERTROCHANTERIC Right 08/06/2020   Procedure: INTRAMEDULLARY (IM) NAIL  INTERTROCHANTRIC;  Surgeon: Margrette Taft BRAVO, MD;  Location: [facility];  Service: Orthopedics;  Laterality: Right;   MASTECTOMY PARTIAL / LUMPECTOMY Left 2007   chemo and radiation   OOPHORECTOMY     for carcinoma in situ   THYROIDECTOMY  11/19/2011   Procedure: THYROIDECTOMY;  Surgeon: Krystal CHRISTELLA Spinner, MD;  Location: [facility];  Service: General;  Laterality: N/A;  Total Thyroidectomy   THYROIDECTOMY      SOCIAL HISTORY: Social History   Socioeconomic History   Marital status: Married    Spouse name: Not on file   Number of children: 6   Years of education: Not on file   Highest education level: Not on file  Occupational History   Not on file  Tobacco Use    Smoking status: Never   Smokeless tobacco: Never  Vaping Use   Vaping status: Never Used  Substance and Sexual Activity   Alcohol use: No   Drug use: No   Sexual activity: Not on file  Other Topics Concern   Not on file  Social History Narrative   Not on file   Social Drivers of Health   Tobacco Use: Low Risk (01/14/2024)   Patient History    Smoking Tobacco Use: Never    Smokeless Tobacco Use: Never    Passive Exposure: Not on file  Financial Resource Strain: Not on file  Food Insecurity: No Food Insecurity (01/14/2024)   Epic    Worried About Programme Researcher, Broadcasting/film/video in the Last Year: Never true    Ran Out of Food in the Last Year: Never true  Transportation Needs: No Transportation Needs (01/14/2024)   Epic    Lack of Transportation (Medical): No    Lack of Transportation (Non-Medical): No  Physical Activity: Not on file  Stress: Not on file  Social Connections: Not on file  Intimate Partner Violence: Not At Risk (01/14/2024)   Epic    Fear of Current or Ex-Partner: No    Emotionally Abused: No    Physically Abused: No    Sexually Abused: No  Depression (PHQ2-9): Low Risk (02/14/2024)   Depression (PHQ2-9)    PHQ-2 Score: 0  Alcohol Screen: Not on file  Housing: Low Risk (01/14/2024)   Epic    Unable to Pay for Housing in the Last Year: No    Number of Times Moved in the Last Year: 0    Homeless in the Last Year: No  Utilities: Not At Risk (01/14/2024)   Epic    Threatened with loss of utilities: No  Health Literacy: Not on file    FAMILY HISTORY: Family History  Problem Relation Age of Onset   Cancer Mother    Diabetes Mother    Heart disease Father    Cancer Sister    Cancer Brother    Breast cancer Cousin     ALLERGIES:  is allergic to amoxicillin-pot clavulanate, citalopram, citalopram hydrobromide, clonazepam, diazepam, empagliflozin, escitalopram, sertraline, and sertraline hcl.  MEDICATIONS:  Current Outpatient Medications  Medication Sig  Dispense Refill   Accu-Chek Softclix Lancets lancets Use to check blood glucose daily as instructed 100 each 1   acetaminophen  (TYLENOL ) 325 MG tablet Take 2 tablets (650 mg total) by mouth every 6 (six) hours as needed for mild pain, fever or headache. 20 tablet 2   Alcohol Swabs (B-D SINGLE USE SWABS REGULAR) PADS USE TWICE DAILY 200 each 2   ALPRAZolam  (XANAX ) 0.25 MG tablet Take 0.25 mg by mouth daily as needed  for anxiety.     Blood Glucose Monitoring Suppl (ACCU-CHEK GUIDE) w/Device KIT Use to test blood glucose daily as directed. E11.65 1 kit 0   cetirizine (ZYRTEC) 10 MG tablet Take 10 mg by mouth at bedtime.     Cholecalciferol  (VITAMIN D3) 1000 units CAPS Take 1,000 Units by mouth daily.     enalapril  (VASOTEC ) 2.5 MG tablet TAKE 1 TABLET ONE TIME EACH DAY     ferrous sulfate  325 (65 FE) MG tablet Take 1 tablet (325 mg total) by mouth 2 (two) times daily with a meal. (Patient taking differently: Take 325 mg by mouth daily with breakfast.) 60 tablet 5   fluticasone  (FLONASE ) 50 MCG/ACT nasal spray Place 1 spray into both nostrils daily as needed for allergies.     furosemide (LASIX) 20 MG tablet      glipiZIDE  (GLUCOTROL  XL) 2.5 MG 24 hr tablet Take 1 tablet (2.5 mg total) by mouth daily with breakfast. 90 tablet 1   glucose blood (ACCU-CHEK GUIDE TEST) test strip Use to check blood glucose daily as instructed 100 strip 3   guaifenesin (HUMIBID E) 400 MG TABS tablet 1 tablet as needed Orally every 4 hrs; Duration: 5 days     JANUVIA  50 MG tablet TAKE ONE TABLET BY MOUTH DAILY 90 tablet 2   Lancets MISC Use to test blood glucose daily as directed. Accu-chek guide. E11.65 100 each 2   levothyroxine  (SYNTHROID ) 100 MCG tablet TAKE 1 TABLET (100 MCG TOTAL) BY MOUTH DAILY BEFORE BREAKFAST. 90 tablet 3   levothyroxine  (SYNTHROID ) 112 MCG tablet Take 1 tablet (112 mcg total) by mouth daily before breakfast. (Patient not taking: Reported on 11/18/2023) 90 tablet 1   lovastatin (MEVACOR) 40 MG  tablet Take 40 mg by mouth at bedtime.      Omega-3 Fatty Acids (FISH OIL) 1000 MG CAPS Take 1,000 mg by mouth daily.     omeprazole (PRILOSEC) 40 MG capsule Take 40 mg daily by mouth.     pioglitazone  (ACTOS ) 15 MG tablet Take 1 tablet (15 mg total) by mouth daily. 90 tablet 3   polyethylene glycol (MIRALAX ) 17 g packet Take 17 g by mouth 2 (two) times daily. (Patient taking differently: Take 17 g by mouth 2 (two) times daily. As needed) 60 each 3   Polyethylene Glycol 400 (BLINK TEARS OP) Apply 1 drop to eye daily as needed (dry eye).     sodium chloride  1 g tablet Take 1 tablet (1 g total) by mouth daily. (Patient not taking: Reported on 11/18/2023) 30 tablet 1   triamcinolone  (KENALOG ) 0.025 % cream Apply 1 Application topically 2 (two) times daily. 30 g 0   vitamin B-12 (CYANOCOBALAMIN ) 1000 MCG tablet Take 1,000 mcg by mouth daily.     No current facility-administered medications for this visit.    REVIEW OF SYSTEMS:   Constitutional: ( - ) fevers, ( - )  chills , ( - ) night sweats Eyes: ( - ) blurriness of vision, ( - ) double vision, ( - ) watery eyes Ears, nose, mouth, throat, and face: ( - ) mucositis, ( - ) sore throat Respiratory: ( - ) cough, ( - ) dyspnea, ( - ) wheezes Cardiovascular: ( - ) palpitation, ( - ) chest discomfort, ( - ) lower extremity swelling Gastrointestinal:  ( - ) nausea, ( - ) heartburn, ( - ) change in bowel habits Skin: ( - ) abnormal skin rashes Lymphatics: ( - ) new lymphadenopathy, ( - )  easy bruising Neurological: ( - ) numbness, ( - ) tingling, ( - ) new weaknesses Behavioral/Psych: ( - ) mood change, ( - ) new changes  All other systems were reviewed with the patient and are negative.  PHYSICAL EXAMINATION: ECOG PERFORMANCE STATUS: 1 - Symptomatic but completely ambulatory  Vitals:   02/14/24 1340  BP: (!) 131/52  Pulse: 65  Resp: 18  Temp: 98.1 F (36.7 C)  SpO2: 100%   Filed Weights   02/14/24 1340  Weight: 178 lb 9.2 oz (81 kg)     GENERAL: well appearing female in NAD  SKIN: skin color, texture, turgor are normal, no rashes or significant lesions EYES: conjunctiva are pink and non-injected, sclera clear LUNGS: clear to auscultation and percussion with normal breathing effort HEART: regular rate & rhythm and no murmurs and no lower extremity edema Musculoskeletal: no cyanosis of digits and no clubbing  PSYCH: alert & oriented x 3, fluent speech NEURO: no focal motor/sensory deficits  LABORATORY DATA:  I have reviewed the data as listed    Latest Ref Rng & Units 01/14/2024    2:40 PM 08/08/2020    5:40 AM 08/07/2020    4:14 AM  CBC  WBC 4.0 - 10.5 K/uL 6.5  8.4  8.5   Hemoglobin 12.0 - 15.0 g/dL 89.7  7.9  8.6   Hematocrit 36.0 - 46.0 % 31.7  24.5  26.4   Platelets 150 - 400 K/uL 164  145  163        Latest Ref Rng & Units 01/14/2024    2:40 PM 09/08/2023   10:52 AM 04/02/2022    2:57 PM  CMP  Glucose 70 - 99 mg/dL 799  824  840   BUN 8 - 23 mg/dL 24  22  22    Creatinine 0.44 - 1.00 mg/dL 8.72  8.80  8.92   Sodium 135 - 145 mmol/L 136  130  132   Potassium 3.5 - 5.1 mmol/L 4.8  5.0  5.1   Chloride 98 - 111 mmol/L 99  95  96   CO2 22 - 32 mmol/L 29  28  28    Calcium  8.9 - 10.3 mg/dL 9.2  9.4  9.0   Total Protein 6.5 - 8.1 g/dL 7.2  7.2  6.8   Total Bilirubin 0.0 - 1.2 mg/dL 0.4  0.4  0.4   Alkaline Phos 38 - 126 U/L 84     AST 15 - 41 U/L 24  17  14    ALT 0 - 44 U/L 15  9  8      ASSESSMENT & PLAN Madeline Fox is a 78 y.o. female who presents to the clinic for continued management of normocytic anemia.   #Normocytic anemia: --Chronic in nature and most likely secondary to CKD. --Workup from 01/14/2024 revealed anemia with a hemoglobin of 10.2, MCV 97.8.  Remaining workup showed no evidence of iron deficiency, vitamin B12 deficiency, folate deficiency copper  deficiency, hemolysis or paraproteinemia. -- Since hemoglobin is above 10, no indication to start EPO injections. --Continue with vitamin  B12 and iron supplementation.  Okay to decrease iron supplementation to once every other day. -- Recommend to monitor closely and return in 4 months with repeat labs.  Provided precautions to return or follow-up with the clinic sooner including worsening fatigue, shortness of breath, dizziness or acute blood loss.  #Stage IIIa chronic kidney disease. --Due to her diabetes and possibly a component of hypertension.  She is currently being followed by nephrology.   #  History of breast cancer. --The patient was treated in 2007 with left lumpectomy followed by chemotherapy and radiation.  This was at [facility].  Full records are not available..  The patient seems to recall receiving 4 cycles of chemotherapy which sounds like Adriamycin and Cytoxan.  -- Most recent mammogram from April 2025 showed no evidence of malignancy.  She continues to have yearly surveillance mammograms.  No orders of the defined types were placed in this encounter.   All questions were answered. The patient knows to call the clinic with any problems, questions or concerns.  I have spent a total of 30 minutes minutes of face-to-face and non-face-to-face time, preparing to see the patient, performing a medically appropriate examination, counseling and educating the patient, documenting clinical information in the electronic health record, independently interpreting results and communicating results to the patient, and care coordination.   Johnston Police, PA-C Department of Hematology/Oncology [facility] at [facility]   "

## 2024-03-22 ENCOUNTER — Ambulatory Visit: Admitting: Nurse Practitioner

## 2024-03-30 ENCOUNTER — Ambulatory Visit: Admitting: Cardiovascular Disease

## 2024-06-13 ENCOUNTER — Inpatient Hospital Stay

## 2024-06-20 ENCOUNTER — Inpatient Hospital Stay: Admitting: Physician Assistant
